# Patient Record
Sex: Male | Born: 1960 | Race: Black or African American | Hispanic: No | Marital: Single | Smoking: Never smoker
Health system: Southern US, Community
[De-identification: ages and names within clinical notes are randomized; demographics above are authoritative.]

## PROBLEM LIST (undated history)

## (undated) DIAGNOSIS — I208 Other forms of angina pectoris: Secondary | ICD-10-CM

## (undated) DIAGNOSIS — I639 Cerebral infarction, unspecified: Secondary | ICD-10-CM

## (undated) DIAGNOSIS — G2581 Restless legs syndrome: Secondary | ICD-10-CM

## (undated) DIAGNOSIS — R269 Unspecified abnormalities of gait and mobility: Secondary | ICD-10-CM

## (undated) DIAGNOSIS — N529 Male erectile dysfunction, unspecified: Secondary | ICD-10-CM

## (undated) DIAGNOSIS — I2510 Atherosclerotic heart disease of native coronary artery without angina pectoris: Secondary | ICD-10-CM

## (undated) DIAGNOSIS — G4700 Insomnia, unspecified: Secondary | ICD-10-CM

## (undated) DIAGNOSIS — D518 Other vitamin B12 deficiency anemias: Secondary | ICD-10-CM

## (undated) DIAGNOSIS — M1990 Unspecified osteoarthritis, unspecified site: Secondary | ICD-10-CM

## (undated) DIAGNOSIS — Z8673 Personal history of transient ischemic attack (TIA), and cerebral infarction without residual deficits: Secondary | ICD-10-CM

## (undated) DIAGNOSIS — L738 Other specified follicular disorders: Secondary | ICD-10-CM

## (undated) DIAGNOSIS — T7840XA Allergy, unspecified, initial encounter: Secondary | ICD-10-CM

## (undated) DIAGNOSIS — I10 Essential (primary) hypertension: Secondary | ICD-10-CM

## (undated) HISTORY — DX: Personal history of transient ischemic attack (TIA), and cerebral infarction without residual deficits: Z86.73

## (undated) HISTORY — DX: Other vitamin B12 deficiency anemias: D51.8

## (undated) HISTORY — DX: Other specified follicular disorders: L73.8

## (undated) HISTORY — DX: Male erectile dysfunction, unspecified: N52.9

## (undated) HISTORY — DX: Insomnia, unspecified: G47.00

## (undated) HISTORY — DX: Restless legs syndrome: G25.81

## (undated) HISTORY — DX: Unspecified abnormalities of gait and mobility: R26.9

## (undated) HISTORY — DX: Allergy, unspecified, initial encounter: T78.40XA

---

## 2011-01-06 ENCOUNTER — Encounter: Payer: Self-pay | Admitting: Family Medicine

## 2011-01-06 ENCOUNTER — Ambulatory Visit (INDEPENDENT_AMBULATORY_CARE_PROVIDER_SITE_OTHER): Payer: 59 | Admitting: Family Medicine

## 2011-01-06 VITALS — Ht 72.0 in | Wt 228.0 lb

## 2011-01-06 DIAGNOSIS — G2581 Restless legs syndrome: Secondary | ICD-10-CM

## 2011-01-06 DIAGNOSIS — J301 Allergic rhinitis due to pollen: Secondary | ICD-10-CM | POA: Insufficient documentation

## 2011-01-06 DIAGNOSIS — Z8249 Family history of ischemic heart disease and other diseases of the circulatory system: Secondary | ICD-10-CM | POA: Insufficient documentation

## 2011-01-06 DIAGNOSIS — N200 Calculus of kidney: Secondary | ICD-10-CM

## 2011-01-06 DIAGNOSIS — N529 Male erectile dysfunction, unspecified: Secondary | ICD-10-CM | POA: Insufficient documentation

## 2011-01-06 DIAGNOSIS — Z23 Encounter for immunization: Secondary | ICD-10-CM

## 2011-01-06 DIAGNOSIS — Z800 Family history of malignant neoplasm of digestive organs: Secondary | ICD-10-CM | POA: Insufficient documentation

## 2011-01-06 DIAGNOSIS — Z0000 Encounter for general adult medical examination without abnormal findings: Secondary | ICD-10-CM

## 2011-01-06 LAB — COMPREHENSIVE METABOLIC PANEL
Albumin: 4.2 g/dL (ref 3.5–5.2)
Alkaline Phosphatase: 53 U/L (ref 39–117)
BUN: 7 mg/dL (ref 6–23)
CO2: 27 mEq/L (ref 19–32)
Calcium: 9.4 mg/dL (ref 8.4–10.5)
Glucose, Bld: 99 mg/dL (ref 70–99)
Potassium: 4.2 mEq/L (ref 3.5–5.3)
Sodium: 141 mEq/L (ref 135–145)
Total Protein: 6.8 g/dL (ref 6.0–8.3)

## 2011-01-06 LAB — LIPID PANEL
Cholesterol: 156 mg/dL (ref 0–200)
HDL: 42 mg/dL (ref >39)
LDL Cholesterol: 106 mg/dL — ABNORMAL HIGH (ref 0–99)
Triglycerides: 40 mg/dL (ref <150)

## 2011-01-06 LAB — POCT URINALYSIS DIPSTICK
Bilirubin, UA: NEGATIVE
Glucose, UA: NEGATIVE
Ketones, UA: NEGATIVE
Leukocytes, UA: NEGATIVE
Nitrite, UA: NEGATIVE
pH, UA: 5

## 2011-01-06 LAB — CBC WITH DIFFERENTIAL/PLATELET
Basophils Relative: 0 % (ref 0–1)
HCT: 45.5 % (ref 39.0–52.0)
Hemoglobin: 15.6 g/dL (ref 13.0–17.0)
Lymphs Abs: 2.2 10*3/uL (ref 0.7–4.0)
MCH: 32.2 pg (ref 26.0–34.0)
MCHC: 34.3 g/dL (ref 30.0–36.0)
Monocytes Absolute: 0.4 10*3/uL (ref 0.1–1.0)
Monocytes Relative: 7 % (ref 3–12)
Neutro Abs: 2.3 10*3/uL (ref 1.7–7.7)
RBC: 4.84 MIL/uL (ref 4.22–5.81)

## 2011-01-06 MED ORDER — ROPINIROLE HCL 1 MG PO TABS: 1.0000 mg | ORAL_TABLET | Freq: Every day | ORAL | Status: DC

## 2011-01-06 MED ORDER — SILDENAFIL CITRATE 100 MG PO TABS: 100.0000 mg | ORAL_TABLET | ORAL | Status: DC | PRN

## 2011-01-06 MED ORDER — CLONAZEPAM 2 MG PO TABS: 2.0000 mg | ORAL_TABLET | Freq: Every day | ORAL | Status: DC

## 2011-01-06 NOTE — Progress Notes (Signed)
  Subjective:    Patient ID: Micheal Gonzalez, male    DOB: 03-19-1961, 50 y.o.   MRN: [id number]  HPI There for complete examination. He does have a previous history of difficulty with throat infections that tend to respond to azithromycin. Other meds have been tried which were unsuccessful. He has had one positive strep screen. He is starting to have a rectal dysfunction, difficulty getting and maintaining an erection. Libido is fine. So has a history of RLS and has had 3 sleep studies. Presently he is on Requip and clonazepam which seems to help. He also has an underlying history of allergies and does occasionally use Benadryl  Review of Systems     Objective:   Physical Exam Ht 6' (1.829 m)  Wt 228 lb (103.42 kg)  BMI 30.92 kg/m2  General Appearance:    Alert, cooperative, no distress, appears stated age  Head:    Normocephalic, without obvious abnormality, atraumatic  Eyes:    PERRL, conjunctiva/corneas clear, EOM's intact, fundi    benign  Ears:    Normal TM's and external ear canals  Nose:   Nares normal, mucosa normal, no drainage or sinus   tenderness  Throat:   Lips, mucosa, and tongue normal; teeth and gums normal  Neck:   Supple, no lymphadenopathy;  thyroid:  no   enlargement/tenderness/nodules; no carotid   bruit or JVD  Back:    Spine nontender, no curvature, ROM normal, no CVA     tenderness  Lungs:     Clear to auscultation bilaterally without wheezes, rales or     ronchi; respirations unlabored  Chest Wall:    No tenderness or deformity   Heart:    Regular rate and rhythm, S1 and S2 normal, no murmur, rub   or gallop  Breast Exam:    No chest wall tenderness, masses or gynecomastia  Abdomen:     Soft, non-tender, nondistended, normoactive bowel sounds,    no masses, no hepatosplenomegaly  Genitalia:    Normal male external genitalia without lesions.  Testicles without masses.  No inguinal hernias.      Extremities:   No clubbing, cyanosis or edema  Pulses:   2+  and symmetric all extremities  Skin:   Skin color, texture, turgor normal, no rashes or lesions  Lymph nodes:   Cervical, supraclavicular, and axillary nodes normal  Neurologic:   CNII-XII intact, normal strength, sensation and gait; reflexes 2+ and symmetric throughout          Psych:   Normal mood, affect, hygiene and grooming.          Assessment & Plan:   1. Routine general medical examination at a health care facility   2. RLS (restless legs syndrome)   3. Allergic rhinitis due to pollen   4. ED (erectile dysfunction)   5. Family history of colon cancer   6. Kidney stones   7. Family history of heart disease in male family member before age 40    PSA was discussed and patient declined Colonoscopy will be set up. I discussed restless leg syndrome with him. I will get the records and see what other medications he has tried. He will continue to treat his allergies as needed. A sample of Viagra was given. He did have stress test 3 years ago therefore we will hold off on this. Discussed followup on his kidney stones.

## 2011-01-10 ENCOUNTER — Telehealth: Payer: Self-pay

## 2011-01-10 NOTE — Telephone Encounter (Signed)
Called pt to let him know labs look good left message 

## 2011-01-24 ENCOUNTER — Other Ambulatory Visit: Payer: Self-pay

## 2011-01-24 MED ORDER — SILDENAFIL CITRATE 100 MG PO TABS: 100.0000 mg | ORAL_TABLET | ORAL | Status: DC | PRN

## 2011-01-24 NOTE — Telephone Encounter (Signed)
Pt said you wanted him to call and let you know the viagra 100 mg work great and if you would please call in RX to [facility] [phone]

## 2011-01-24 NOTE — Telephone Encounter (Signed)
Viagra renewed.

## 2011-02-01 ENCOUNTER — Telehealth: Payer: Self-pay | Admitting: Family Medicine

## 2011-02-01 NOTE — Telephone Encounter (Signed)
CALLED PHARMACY RX WAS SENT AND IS READY FOR PICK UP

## 2011-02-01 NOTE — Telephone Encounter (Signed)
Pt called says the Viagra is working and would like a [facility] [address].  Also pt was concerned that we had lost his medical record and I reviewed in system and all is there.

## 2011-02-01 NOTE — Telephone Encounter (Signed)
CALLED PHARMACY TO SEE IF RX WAS SENT THEY SAID IT WAS READY FOR PT TO PICK UP

## 2011-02-01 NOTE — Telephone Encounter (Signed)
My notes indicate that he should have a refill on file already. Check with the pharmacy and let me know

## 2011-02-01 NOTE — Telephone Encounter (Signed)
CALLED PT TO INFORM HIM LEFT MESSAGE RX READY AT PHARMACY

## 2011-03-25 HISTORY — PX: COLONOSCOPY: SHX174

## 2011-03-28 ENCOUNTER — Encounter: Payer: Self-pay | Admitting: Internal Medicine

## 2011-07-25 ENCOUNTER — Encounter: Payer: Self-pay | Admitting: Medical

## 2011-07-25 ENCOUNTER — Ambulatory Visit (INDEPENDENT_AMBULATORY_CARE_PROVIDER_SITE_OTHER): Payer: 59 | Admitting: Medical

## 2011-07-25 VITALS — BP 130/80 | HR 92 | Temp 98.2°F | Resp 14 | Wt 224.0 lb

## 2011-07-25 DIAGNOSIS — G4700 Insomnia, unspecified: Secondary | ICD-10-CM

## 2011-07-25 DIAGNOSIS — J40 Bronchitis, not specified as acute or chronic: Secondary | ICD-10-CM

## 2011-07-25 MED ORDER — CLONAZEPAM 2 MG PO TABS: 2.0000 mg | ORAL_TABLET | Freq: Every day | ORAL | Status: DC

## 2011-07-25 MED ORDER — HYDROCODONE-HOMATROPINE 5-1.5 MG/5ML PO SYRP: 5.0000 mL | ORAL_SOLUTION | Freq: Three times a day (TID) | ORAL | Status: AC | PRN

## 2011-07-25 NOTE — Patient Instructions (Signed)
Rest, drink plenty of fluids (water, gingerale, Gatorade, for example), OTC Ibuprofen 200 mg, 2-3 tablets every 6 hours as needed for aches/pains/fever.  Consider OTC Robitussin DM or Mucinex DM  If worse or not improving by Wednesday, then call back.

## 2011-07-25 NOTE — Progress Notes (Signed)
Subjective:   HPI  Micheal Gonzalez is a 51 y.o. male who presents with illness.  Started Friday 3 days ago with throat irritation, had fever, sweats, whole body felt warm, no appetite, felt tired and had lots of cough.  The cough has gotten better, but at times when lying down, has bad coughing spell.  No more fever at this point.  In the last 24 hours has post nasal drainage, some hoarseness, phlegm in throat, bad headache on and off.  Doesn't feel particularly congested though.  No sick contacts.  Using prescription codeine cough syrup he had left over that helped with the coughing spells.  Also using some Tylenol for aches, Mucinex.  No other aggravating or relieving factors.  He also needs refill on his sleep medication today.  Uses this nightly, doing well on this.   No other c/o.  The following portions of the patient's history were reviewed and updated as appropriate: allergies, current medications, past family history, past medical history, past social history, past surgical history and problem list.  Past Medical History  Diagnosis Date  . Allergy   . Chronic kidney disease     No Known Allergies   Review of Systems Heent: burning sensation in throat, no ear pain, no sinus pressure Heart: no CP, palpitations Lungs: occasional SOB feeling being worn out, but no wheezing GI: no pain, NVD GU: negative ROS reviewed and was negative other than noted in HPI or above.    Objective:   Physical Exam  General appearance: alert, no distress, WD/WN HEENT: normocephalic, sclerae anicteric, TMs pearly, nares patent, no discharge or erythema, pharynx with mild erythema Oral cavity: MMM, no lesions Neck: supple, no lymphadenopathy, no thyromegaly, no masses Heart: RRR, normal S1, S2, no murmurs Lungs: CTA bilaterally, no wheezes, rhonchi, or rales Abdomen: +bs, soft, non tender, non distended, no masses, no hepatomegaly, no splenomegaly Pulses: 2+ symmetric, upper and lower  extremities, normal cap refill   Assessment and Plan :     Encounter Diagnoses  Name Primary?  . Bronchitis Yes  . Insomnia    Bronchitis - likely secondary to recent flu like illness. Advised rest, goo d hydration, begin Hycodan syrup prn, call/return if worse or not improving.  Insomnia - refilled Clonazepam for sleep

## 2011-07-29 ENCOUNTER — Telehealth: Payer: Self-pay | Admitting: Internal Medicine

## 2011-07-29 NOTE — Telephone Encounter (Signed)
done

## 2011-08-05 ENCOUNTER — Telehealth: Payer: Self-pay | Admitting: Family Medicine

## 2011-08-05 MED ORDER — CLONAZEPAM 2 MG PO TABS: 2.0000 mg | ORAL_TABLET | Freq: Every day | ORAL | Status: DC

## 2011-08-05 NOTE — Telephone Encounter (Signed)
Renew clonazepam

## 2011-08-05 NOTE — Telephone Encounter (Signed)
Called in to [facility]. 

## 2011-08-09 ENCOUNTER — Other Ambulatory Visit: Payer: Self-pay | Admitting: Medical

## 2011-08-09 ENCOUNTER — Telehealth: Payer: Self-pay | Admitting: Medical

## 2011-08-09 MED ORDER — BENZONATATE 200 MG PO CAPS: 200.0000 mg | ORAL_CAPSULE | Freq: Two times a day (BID) | ORAL | Status: DC | PRN

## 2011-08-09 NOTE — Telephone Encounter (Signed)
Patient states that he would like to try the Tesslon Pearls. He states that he is only coughing. No fever,nausea or vomiting. CLS

## 2011-08-09 NOTE — Telephone Encounter (Signed)
Either we can do another round of cough syrup or Tessalon perles.  If still feeling sick, find out about symptoms or he can return

## 2011-08-15 ENCOUNTER — Encounter: Payer: Self-pay | Admitting: Family Medicine

## 2011-08-15 ENCOUNTER — Ambulatory Visit (INDEPENDENT_AMBULATORY_CARE_PROVIDER_SITE_OTHER): Payer: 59 | Admitting: Family Medicine

## 2011-08-15 VITALS — BP 130/90 | HR 73 | Wt 224.0 lb

## 2011-08-15 DIAGNOSIS — J209 Acute bronchitis, unspecified: Secondary | ICD-10-CM

## 2011-08-15 MED ORDER — BENZONATATE 200 MG PO CAPS: 200.0000 mg | ORAL_CAPSULE | Freq: Three times a day (TID) | ORAL | Status: AC | PRN

## 2011-08-15 MED ORDER — CLARITHROMYCIN 500 MG PO TABS: 500.0000 mg | ORAL_TABLET | Freq: Two times a day (BID) | ORAL | Status: AC

## 2011-08-15 NOTE — Progress Notes (Signed)
  Subjective:    Patient ID: Micheal Gonzalez, male    DOB: [birth year], 51 y.o.   MRN: [id number]  HPI He has a 2 week history that started initially with URI symptoms of fever chills, coughing congestion. Now he is just having difficulty with cough but no fever, chills, sore throat or earache no eye allergies and uses Benadryl. He does not smoke   Review of Systems     Objective:   Physical Exam alert and in no distress. Tympanic membranes and canals are normal. Throat is clear. Tonsils are normal. Neck is supple without adenopathy or thyromegaly. Cardiac exam shows a regular sinus rhythm without murmurs or gallops. Lungs are clear to auscultation.        Assessment & Plan:   1. Bronchitis, acute  benzonatate (TESSALON) 200 MG capsule, clarithromycin (BIAXIN) 500 MG tablet

## 2011-08-15 NOTE — Patient Instructions (Signed)
If you're not totally back to normal at the end of the course of antibiotics, get it refilled

## 2011-09-29 ENCOUNTER — Telehealth: Payer: Self-pay | Admitting: Family Medicine

## 2011-09-30 MED ORDER — CLONAZEPAM 2 MG PO TABS: 2.0000 mg | ORAL_TABLET | Freq: Every day | ORAL | Status: DC

## 2011-09-30 NOTE — Telephone Encounter (Signed)
Klonopin renewed

## 2011-10-31 ENCOUNTER — Ambulatory Visit (INDEPENDENT_AMBULATORY_CARE_PROVIDER_SITE_OTHER): Payer: 59 | Admitting: Medical

## 2011-10-31 ENCOUNTER — Encounter: Payer: Self-pay | Admitting: Medical

## 2011-10-31 VITALS — BP 122/88 | HR 95 | Temp 98.2°F | Resp 16 | Wt 226.0 lb

## 2011-10-31 DIAGNOSIS — R05 Cough: Secondary | ICD-10-CM

## 2011-10-31 DIAGNOSIS — J029 Acute pharyngitis, unspecified: Secondary | ICD-10-CM

## 2011-10-31 MED ORDER — CLARITHROMYCIN 500 MG PO TABS: 500.0000 mg | ORAL_TABLET | Freq: Two times a day (BID) | ORAL | Status: AC

## 2011-10-31 MED ORDER — HYDROCODONE-HOMATROPINE 5-1.5 MG/5ML PO SYRP: 5.0000 mL | ORAL_SOLUTION | Freq: Three times a day (TID) | ORAL | Status: AC | PRN

## 2011-10-31 MED ORDER — MONTELUKAST SODIUM 10 MG PO TABS: 10.0000 mg | ORAL_TABLET | Freq: Every day | ORAL | Status: DC

## 2011-10-31 NOTE — Progress Notes (Signed)
Subjective: Here with c/o irritated throat, cough intermittent x 3 days.   He has had similar all his life, has flares ups occasionally.  He was seen here twice in April for the same.  Has had allergy testing in the past, allergic to cigarette smoke, mold and pollen.  Has been on numerous medication in the past.  The regimen prescribed in April (Biaxin) helped but the Tessalon didn't help.  He currently denies sneezing, fever, NVD, chills, sweats.  No sob or wheezing.   Doesn't feel sick.  Tired from coughing all night.  No other c/o.  The following portions of the patient's history were reviewed and updated as appropriate: allergies, current medications, past family history, past medical history, past social history, past surgical history and problem list.  Past Medical History  Diagnosis Date  . Allergy   . ED (erectile dysfunction)   . RLS (restless legs syndrome)   . Folliculitis barbae   . Insomnia     No Known Allergies  Review of Systems ROS reviewed and was negative other than noted in HPI or above.    Objective:   Physical Exam  General appearence: alert, no distress, WD/WN, coughing HEENT: normocephalic, sclerae anicteric, TMs pearly, nares with swollen turbinates, no discharge or erythema, pharynx normal Oral cavity: MMM, no lesions Neck: supple, no lymphadenopathy, no thyromegaly, no masses Heart: RRR, normal S1, S2, no murmurs Lungs: CTA bilaterally, no wheezes, rhonchi, or rales  Assessment and Plan :     Encounter Diagnoses  Name Primary?  . Sore throat Yes  . Cough    Symptoms are most likely inflammatory and due to allergen.  He likely gets antiinflammatory response from antibiotic.  Discussed prevention, allergen control and both preventative and acute therapy.  Script for Biaxin, Singulair, and Hycodan.  Advised salt water gargles, hydrate well, trigger avoidance.  Can use Hycodan short term, Biaxin, but begin Singulair and OTC Zyrtec or Allegra daily.   C/t  preventative regimen for 4-6 mo and lets see how this does. Discussed causes of chronic cough.

## 2011-12-28 ENCOUNTER — Encounter: Payer: Self-pay | Admitting: Family Medicine

## 2011-12-28 ENCOUNTER — Telehealth: Payer: Self-pay | Admitting: Internal Medicine

## 2011-12-28 ENCOUNTER — Ambulatory Visit (INDEPENDENT_AMBULATORY_CARE_PROVIDER_SITE_OTHER): Payer: 59 | Admitting: Family Medicine

## 2011-12-28 VITALS — BP 124/80 | HR 73 | Wt 233.0 lb

## 2011-12-28 DIAGNOSIS — G2581 Restless legs syndrome: Secondary | ICD-10-CM

## 2011-12-28 MED ORDER — CLONAZEPAM 2 MG PO TABS: 2.0000 mg | ORAL_TABLET | Freq: Every day | ORAL | Status: DC

## 2011-12-28 NOTE — Patient Instructions (Signed)
Take 2 Requip for the next several weeks. Hold off on taking the Klonopin then call me

## 2011-12-28 NOTE — Telephone Encounter (Signed)
CALLED MED IN PER [facility] W/ 5 REFILLS

## 2011-12-28 NOTE — Progress Notes (Signed)
  Subjective:    Patient ID: Micheal Gonzalez, male    DOB: 07-May-1960, 51 y.o.   MRN: [id number]  HPI He is here for consult concerning sleep disturbance. He states that he has had 3 sleep studies done the most recent one done several years ago which did show RLS but no evidence of sleep apnea. At that time he was placed on Klonopin stating this was for sleep and Requip for his restless leg. He is here for further consultation concerning his stating that his Mays 4 times per week she will still wake up tired because of the restless legs. Presently he is on 1 mg.   Review of Systems     Objective:   Physical Exam Alert and in no distress otherwise not examined       Assessment & Plan:   1. RLS (restless legs syndrome)    I'll increase his Requip to 2 mg. He is to call me in one or 2 weeks and let me know how this works. Encouraged him to avoid using Klonopin. Release of information form sent again.

## 2011-12-28 NOTE — Telephone Encounter (Signed)
Renew the medication with 5 refills

## 2012-01-25 ENCOUNTER — Other Ambulatory Visit: Payer: Self-pay | Admitting: Family Medicine

## 2012-02-02 ENCOUNTER — Other Ambulatory Visit: Payer: Self-pay | Admitting: Family Medicine

## 2012-02-02 MED ORDER — CLINDAMYCIN PHOS-BENZOYL PEROX 1-5 % EX GEL: Freq: Two times a day (BID) | CUTANEOUS | Status: DC

## 2012-02-02 NOTE — Telephone Encounter (Signed)
Is this ok?

## 2012-02-02 NOTE — Telephone Encounter (Signed)
He is supposed to be taking 2 mg. Check and see if this is working.

## 2012-02-02 NOTE — Telephone Encounter (Signed)
Pt states he has always taken 1 mg

## 2012-02-23 ENCOUNTER — Other Ambulatory Visit: Payer: Self-pay | Admitting: Family Medicine

## 2012-02-24 NOTE — Telephone Encounter (Signed)
Is this ok?

## 2012-04-02 ENCOUNTER — Telehealth: Payer: Self-pay | Admitting: Family Medicine

## 2012-04-02 NOTE — Telephone Encounter (Signed)
Have him set up an appointment so we can go over this

## 2012-04-02 NOTE — Telephone Encounter (Signed)
Pt dropped off a letter from [employer] stating that they will no longer cover duac. I am sending that letter back attached to his paper chart. This is active first of the year. Pt needs refill on that now before end of year.

## 2012-04-02 NOTE — Telephone Encounter (Signed)
Pt is coming in tomorrow to discuss this 

## 2012-04-02 NOTE — Telephone Encounter (Signed)
Called pt to return my call.

## 2012-04-03 ENCOUNTER — Ambulatory Visit (INDEPENDENT_AMBULATORY_CARE_PROVIDER_SITE_OTHER): Payer: 59 | Admitting: Family Medicine

## 2012-04-03 ENCOUNTER — Encounter: Payer: Self-pay | Admitting: Family Medicine

## 2012-04-03 VITALS — BP 140/86 | HR 72 | Wt 223.0 lb

## 2012-04-03 DIAGNOSIS — L738 Other specified follicular disorders: Secondary | ICD-10-CM

## 2012-04-03 DIAGNOSIS — N529 Male erectile dysfunction, unspecified: Secondary | ICD-10-CM

## 2012-04-03 DIAGNOSIS — B350 Tinea barbae and tinea capitis: Secondary | ICD-10-CM

## 2012-04-03 DIAGNOSIS — G2581 Restless legs syndrome: Secondary | ICD-10-CM

## 2012-04-03 MED ORDER — CLINDAMYCIN PHOS-BENZOYL PEROX 1-5 % EX GEL: Freq: Two times a day (BID) | CUTANEOUS | Status: DC

## 2012-04-03 MED ORDER — PRAMIPEXOLE DIHYDROCHLORIDE 0.25 MG PO TABS: 0.2500 mg | ORAL_TABLET | Freq: Three times a day (TID) | ORAL | Status: DC

## 2012-04-03 NOTE — Progress Notes (Signed)
  Subjective:    Patient ID: Micheal Gonzalez, male    DOB: 1960-10-27, 51 y.o.   MRN: [id number]  HPI He is here for consult concerning multiple issues. His insurance will change would like to switch to a different product for his skin. 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 He also has underlying RLS. He did increase his Requip to 2 mg but it caused GI distress and he is back off to one. He also has erectile dysfunction and has questions concerning using a different medication. He is not presently on Viagra and gets good results with this. Apparently his insurance is going to cut back on payment.  Review of Systems     Objective:   Physical Exam Alert and in no distress otherwise not examined       Assessment & Plan:   1. Folliculitis barbae  clindamycin-benzoyl peroxide (BENZACLIN) gel  2. RLS (restless legs syndrome)  pramipexole (MIRAPEX) 0.25 MG tablet  3. ED (erectile dysfunction)     we will again attempt to get his records from his physician in [place]. The request form has been sent off 4 times but we have yet to receive his sleep study. I will switch him to Mirapex starting at 0.25. He is to use this for several days and if no good results, he'll increase this to 0.5. We also discussed erectile dysfunction. He seems to be doing quite well on Viagra and I recommended that he continue on this and use the minimum effective dose to help with reducing cost. He is to use of new topical medication however if he notes difficulty, he is to let me know so I can hopefully get him the branded product.

## 2012-04-08 ENCOUNTER — Other Ambulatory Visit: Payer: Self-pay | Admitting: Family Medicine

## 2012-04-09 NOTE — Telephone Encounter (Signed)
Is this ok?

## 2012-05-08 ENCOUNTER — Encounter: Payer: Self-pay | Admitting: Family Medicine

## 2012-05-08 ENCOUNTER — Ambulatory Visit (INDEPENDENT_AMBULATORY_CARE_PROVIDER_SITE_OTHER): Payer: 59 | Admitting: Family Medicine

## 2012-05-08 VITALS — BP 120/80 | HR 66 | Temp 97.8°F | Wt 228.0 lb

## 2012-05-08 DIAGNOSIS — J029 Acute pharyngitis, unspecified: Secondary | ICD-10-CM

## 2012-05-08 LAB — POCT RAPID STREP A (OFFICE): Rapid Strep A Screen: NEGATIVE

## 2012-05-08 NOTE — Progress Notes (Signed)
  Subjective:    Patient ID: Micheal Gonzalez, male    DOB: 04/12/1961, 52 y.o.   MRN: [id number]  HPI He complains of a three-day history of sore throat, nasal congestion, slight lightheadedness with headache and fatigue. No fever, myalgias, nausea or diarrhea.   Review of Systems     Objective:   Physical Exam alert and in no distress. Tympanic membranes and canals are normal. Throat is clear. Tonsils are normal. Neck is supple without adenopathy or thyromegaly. Cardiac exam shows a regular sinus rhythm without murmurs or gallops. Lungs are clear to auscultation. Strep test is negative       Assessment & Plan:   1. Sore throat  Rapid Strep A   supportive care. He is to call if further difficulties.

## 2012-05-08 NOTE — Patient Instructions (Signed)
Viral Pharyngitis  Viral pharyngitis is a viral infection that produces redness, pain, and swelling (inflammation) of the throat. It can spread from person to person (contagious).  CAUSES  Viral pharyngitis is caused by inhaling a large amount of certain germs called viruses. Many different viruses cause viral pharyngitis.  SYMPTOMS  Symptoms of viral pharyngitis include:   Sore throat.   Tiredness.   Stuffy nose.   Low-grade fever.   Congestion.   Cough.  TREATMENT  Treatment includes rest, drinking plenty of fluids, and the use of over-the-counter medication (approved by your caregiver).  HOME CARE INSTRUCTIONS    Drink enough fluids to keep your urine clear or pale yellow.   Eat soft, cold foods such as ice cream, frozen ice pops, or gelatin dessert.   Gargle with warm salt water (1 tsp salt per 1 qt of water).   If over age 7, throat lozenges may be used safely.   Only take over-the-counter or prescription medicines for pain, discomfort, or fever as directed by your caregiver. Do not take aspirin.  To help prevent spreading viral pharyngitis to others, avoid:   Mouth-to-mouth contact with others.   Sharing utensils for eating and drinking.   Coughing around others.  SEEK MEDICAL CARE IF:    You are better in a few days, then become worse.   You have a fever or pain not helped by pain medicines.   There are any other changes that concern you.  Document Released: 01/19/2005 Document Revised: 07/04/2011 Document Reviewed: 06/17/2010  [employer] Patient Information 2013 [employer].

## 2012-05-17 ENCOUNTER — Ambulatory Visit (INDEPENDENT_AMBULATORY_CARE_PROVIDER_SITE_OTHER): Payer: 59 | Admitting: Family Medicine

## 2012-05-17 ENCOUNTER — Encounter: Payer: Self-pay | Admitting: Family Medicine

## 2012-05-17 ENCOUNTER — Telehealth: Payer: Self-pay | Admitting: Internal Medicine

## 2012-05-17 VITALS — BP 130/90 | HR 60 | Ht 72.0 in | Wt 227.0 lb

## 2012-05-17 DIAGNOSIS — Z8249 Family history of ischemic heart disease and other diseases of the circulatory system: Secondary | ICD-10-CM

## 2012-05-17 DIAGNOSIS — G2581 Restless legs syndrome: Secondary | ICD-10-CM

## 2012-05-17 DIAGNOSIS — N529 Male erectile dysfunction, unspecified: Secondary | ICD-10-CM

## 2012-05-17 DIAGNOSIS — Z87442 Personal history of urinary calculi: Secondary | ICD-10-CM

## 2012-05-17 DIAGNOSIS — J309 Allergic rhinitis, unspecified: Secondary | ICD-10-CM

## 2012-05-17 DIAGNOSIS — Z823 Family history of stroke: Secondary | ICD-10-CM | POA: Insufficient documentation

## 2012-05-17 DIAGNOSIS — Z0000 Encounter for general adult medical examination without abnormal findings: Secondary | ICD-10-CM

## 2012-05-17 DIAGNOSIS — Z23 Encounter for immunization: Secondary | ICD-10-CM

## 2012-05-17 DIAGNOSIS — Z800 Family history of malignant neoplasm of digestive organs: Secondary | ICD-10-CM

## 2012-05-17 LAB — COMPREHENSIVE METABOLIC PANEL
ALT: 13 U/L (ref 0–53)
BUN: 8 mg/dL (ref 6–23)
CO2: 26 mEq/L (ref 19–32)
Calcium: 9.1 mg/dL (ref 8.4–10.5)
Chloride: 104 mEq/L (ref 96–112)
Creat: 1.22 mg/dL (ref 0.50–1.35)
Total Bilirubin: 0.6 mg/dL (ref 0.3–1.2)

## 2012-05-17 LAB — LIPID PANEL
Cholesterol: 161 mg/dL (ref 0–200)
HDL: 50 mg/dL (ref >39)
Total CHOL/HDL Ratio: 3.2 Ratio

## 2012-05-17 LAB — CBC WITH DIFFERENTIAL/PLATELET
Eosinophils Absolute: 0.1 10*3/uL (ref 0.0–0.7)
Eosinophils Relative: 2 % (ref 0–5)
HCT: 43.6 % (ref 39.0–52.0)
Lymphocytes Relative: 40 % (ref 12–46)
Lymphs Abs: 1.8 10*3/uL (ref 0.7–4.0)
MCH: 32.8 pg (ref 26.0–34.0)
MCV: 91 fL (ref 78.0–100.0)
Monocytes Absolute: 0.4 10*3/uL (ref 0.1–1.0)
Platelets: 191 10*3/uL (ref 150–400)
RBC: 4.79 MIL/uL (ref 4.22–5.81)
RDW: 13.9 % (ref 11.5–15.5)
WBC: 4.5 10*3/uL (ref 4.0–10.5)

## 2012-05-17 LAB — POCT URINALYSIS DIPSTICK
Bilirubin, UA: NEGATIVE
Ketones, UA: NEGATIVE
Leukocytes, UA: NEGATIVE
Nitrite, UA: NEGATIVE

## 2012-05-17 NOTE — Progress Notes (Signed)
Subjective:    Patient ID: Micheal Gonzalez, male    DOB: 10-13-1960, 52 y.o.   MRN: [id number]  HPI He is here for complete examination. He has a sister that recently had a CVA who was in her early 45s. His mother apparently had a heart attack at age 41. He also has another sister that had colon cancer. He has a remote history of renal stone. He does have history of seasonal allergies give him difficulty mainly in the spring. He does have a history of RLS as well as a questionable history of sleep apnea. That record was reviewed. He seems to be quite stable on his present medication regimen of Requip and Klonopin. At this time I see no reason to change this. He does have underlying ED .   Review of Systems Negative except as above    Objective:   Physical Exam BP 130/90  Pulse 60  Ht 6' (1.829 m)  Wt 227 lb (102.967 kg)  BMI 30.79 kg/m2  General Appearance:    Alert, cooperative, no distress, appears stated age  Head:    Normocephalic, without obvious abnormality, atraumatic  Eyes:    PERRL, conjunctiva/corneas clear, EOM's intact, fundi    benign  Ears:    Normal TM's and external ear canals  Nose:   Nares normal, mucosa normal, no drainage or sinus   tenderness  Throat:   Lips, mucosa, and tongue normal; teeth and gums normal  Neck:   Supple, no lymphadenopathy;  thyroid:  no   enlargement/tenderness/nodules; no carotid   bruit or JVD  Back:    Spine nontender, no curvature, ROM normal, no CVA     tenderness  Lungs:     Clear to auscultation bilaterally without wheezes, rales or     ronchi; respirations unlabored  Chest Wall:    No tenderness or deformity   Heart:    Regular rate and rhythm, S1 and S2 normal, no murmur, rub   or gallop  Breast Exam:    No chest wall tenderness, masses or gynecomastia  Abdomen:     Soft, non-tender, nondistended, normoactive bowel sounds,    no masses, no hepatosplenomegaly  Genitalia:   deferred   Rectal:   deferred   Extremities:   No  clubbing, cyanosis or edema  Pulses:   2+ and symmetric all extremities  Skin:   Skin color, texture, turgor normal, no rashes or lesions  Lymph nodes:   Cervical, supraclavicular, and axillary nodes normal  Neurologic:   CNII-XII intact, normal strength, sensation and gait; reflexes 2+ and symmetric throughout          Psych:   Normal mood, affect, hygiene and grooming.           Assessment & Plan:   1. Routine general medical examination at a health care facility  POCT Urinalysis Dipstick, Visual acuity screening, EKG 12-Lead, Lipid panel, CBC with Differential, Comprehensive metabolic panel  2. Need for prophylactic vaccination and inoculation against influenza  Flu vaccine greater than or equal to 3yo preservative free IM  3. Family history of CVA    4. Family history of colon cancer    5. Family history of heart disease in male family member before age 44  EKG 12-Lead, Ambulatory referral to Cardiology  6. RLS (restless legs syndrome)    7. History of renal stone    8. ED (erectile dysfunction)    9. Allergic rhinitis, mild     did recommend taking one  baby aspirin every day. Refer to cardiology because of his family history. He has had a colonoscopy. Flu shot given with risks and benefits discussed. Discussed possible statin therapy after I review the lab data. Continue on his present medication regimen for the RLS. Discussed treatment of his kidney stone especially if this occurs during regular office hours.

## 2012-05-17 NOTE — Telephone Encounter (Signed)
Pt has an appt with [facility] cardiology Thursday February 6,2014 @ 8:45am with Dr. Melburn Popper. [address]

## 2012-05-18 NOTE — Progress Notes (Signed)
Quick Note:  The blood work is normal ______ 

## 2012-05-18 NOTE — Progress Notes (Signed)
Quick Note:  CALLED PT HOME# PT WAS ADVISED BLOOD WORK NORMAL PT VERBALIZED UNDERSTANDING ______

## 2012-05-21 ENCOUNTER — Encounter: Payer: Self-pay | Admitting: Internal Medicine

## 2012-05-31 ENCOUNTER — Ambulatory Visit (INDEPENDENT_AMBULATORY_CARE_PROVIDER_SITE_OTHER): Payer: 59 | Admitting: Cardiovascular Disease

## 2012-05-31 ENCOUNTER — Encounter: Payer: Self-pay | Admitting: Cardiovascular Disease

## 2012-05-31 VITALS — BP 152/106 | HR 69 | Ht 72.0 in | Wt 229.8 lb

## 2012-05-31 DIAGNOSIS — I10 Essential (primary) hypertension: Secondary | ICD-10-CM

## 2012-05-31 NOTE — Progress Notes (Signed)
     [name] Date of Birth  1961-02-08       [facility]    [place] [address]  [address], [address] [place], [place]  [address]   [place], [place]  [address] [phone]     [phone]   Fax  [phone]    Fax [phone]  Problem List: 1. Hypertension 2.   History of Present Illness:  Micheal Gonzalez is a 52 yo with a strong family history of cardiac problems ( mother had CABG)  He was referred today by Dr. Susann Givens for further evaluation.  He denies any episodes of chest pain or shortness of breath. He denies any syncope. 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 He does not get much regular exercise.  His BP is elevated today - it typically is well controlled.    Current Outpatient Prescriptions on File Prior to Visit  Medication Sig Dispense Refill  . clonazePAM (KLONOPIN) 2 MG tablet Take 1 tablet (2 mg total) by mouth daily.  30 tablet  5  . rOPINIRole (REQUIP) 1 MG tablet Take 1 mg by mouth at bedtime.       Marland Kitchen VIAGRA 100 MG tablet TAKE 1 TABLET (100 MG TOTAL) BY MOUTH AS NEEDED FOR ERECTILE DYSFUNCTION.  6 tablet  11    No Known Allergies  Past Medical History  Diagnosis Date  . Allergy   . ED (erectile dysfunction)   . RLS (restless legs syndrome)   . Folliculitis barbae   . Insomnia     Past Surgical History  Procedure Date  . Colonoscopy 03/25/11    History  Smoking status  . Never Smoker   Smokeless tobacco  . Never Used    History  Alcohol Use  . 1.0 oz/week  . 2 drink(s) per week    Comment: 2 drink per week.    Family History  Problem Relation Age of Onset  . Cancer Mother 43    Breast cancer  . Heart disease Mother 57    CABG  . Cancer Sister 55    Colon Ca    Reviw of Systems:  Reviewed in the HPI.  All other systems are negative.  Physical Exam: Blood pressure 152/106, pulse 69, height 6' (1.829 m), weight 229 lb 12.8 oz (104.237 kg),  SpO2 98.00%. General: Well developed, well nourished, in no acute distress.  Head: Normocephalic, atraumatic, sclera non-icteric, mucus membranes are moist,   Neck: Supple. Carotids are 2 + without bruits. No JVD   Lungs: Clear   Heart: RR, normal S1, S2  Abdomen: Soft, non-tender, non-distended with normal bowel sounds.  Msk:  Strength and tone are normal   Extremities: No clubbing or cyanosis. No edema.  Distal pedal pulses are 2+ and equal    Neuro: CN II - XII intact.  Alert and oriented X 3.   Psych:  Normal   ECG: May 17, 2012:  NSR at 65. Voltage for LVH.  Assessment / Plan:

## 2012-05-31 NOTE — Assessment & Plan Note (Signed)
Micheal Gonzalez presents today for further evaluation of his cardiovascular risks. His blood pressure is elevated today and has been elevated for the past several months. He denies any chest pain or shortness breath. There is no indication for stress testing at this point.  We had long discussion about low-salt diet and a regular exercise program. We will give him information on the DASH diet. I've asked him to exercise for an hour at a 3-4 times a week. I'll see him back in 6 months for followup office visit. He was given him the goal of losing 10 pounds by that time.  His cholesterol levels are quite good.

## 2012-05-31 NOTE — Patient Instructions (Addendum)
Your physician recommends that you continue on your current medications as directed. Please refer to the Current Medication list given to you today.  Your physician encouraged you to lose weight for better health.  Your physician wants you to follow-up in: 6 months You will receive a reminder letter in the mail two months in advance. If you don't receive a letter, please call our office to schedule the follow-up appointment.  REDUCE HIGH SODIUM FOODS LIKE CANNED SOUP, GRAVY, SAUCES, READY PREPARED FOODS LIKE FROZEN FOODS; LEAN CUISINE, LASAGNA. BACON, SAUSAGE, LUNCH MEAT, FAST FOODS.Marland Kitchen   DASH Diet The DASH diet stands for "Dietary Approaches to Stop Hypertension." It is a healthy eating plan that has been shown to reduce high blood pressure (hypertension) in as little as 14 days, while also possibly providing other significant health benefits. These other health benefits include reducing the risk of breast cancer after menopause and reducing the risk of type 2 diabetes, heart disease, colon cancer, and stroke. Health benefits also include weight loss and slowing kidney failure in patients with chronic kidney disease.  DIET GUIDELINES  Limit salt (sodium). Your diet should contain less than 1500 mg of sodium daily.  Limit refined or processed carbohydrates. Your diet should include mostly whole grains. Desserts and added sugars should be used sparingly.  Include small amounts of heart-healthy fats. These types of fats include nuts, oils, and tub margarine. Limit saturated and trans fats. These fats have been shown to be harmful in the body. CHOOSING FOODS  The following food groups are based on a 2000 calorie diet. See your Registered Dietitian for individual calorie needs. Grains and Grain Products (6 to 8 servings daily)  Eat More Often: Whole-wheat bread, brown rice, whole-grain or wheat pasta, quinoa, popcorn without added fat or salt (air popped).  Eat Less Often: White bread, white pasta,  white rice, cornbread. Vegetables (4 to 5 servings daily)  Eat More Often: Fresh, frozen, and canned vegetables. Vegetables may be raw, steamed, roasted, or grilled with a minimal amount of fat.  Eat Less Often/Avoid: Creamed or fried vegetables. Vegetables in a cheese sauce. Fruit (4 to 5 servings daily)  Eat More Often: All fresh, canned (in natural juice), or frozen fruits. Dried fruits without added sugar. One hundred percent fruit juice ( cup [237 mL] daily).  Eat Less Often: Dried fruits with added sugar. Canned fruit in light or heavy syrup. [employer], Fish, and Poultry (2 servings or less daily. One serving is 3 to 4 oz [85-114 g]).  Eat More Often: Ninety percent or leaner ground beef, tenderloin, sirloin. Round cuts of beef, chicken breast, [place] breast. All fish. Grill, bake, or broil your meat. Nothing should be fried.  Eat Less Often/Avoid: Fatty cuts of meat, [place], or chicken leg, thigh, or wing. Fried cuts of meat or fish. Dairy (2 to 3 servings)  Eat More Often: Low-fat or fat-free milk, low-fat plain or light yogurt, reduced-fat or part-skim cheese.  Eat Less Often/Avoid: Milk (whole, 2%).Whole milk yogurt. Full-fat cheeses. Nuts, Seeds, and Legumes (4 to 5 servings per week)  Eat More Often: All without added salt.  Eat Less Often/Avoid: Salted nuts and seeds, canned beans with added salt. Fats and Sweets (limited)  Eat More Often: Vegetable oils, tub margarines without trans fats, sugar-free gelatin. Mayonnaise and salad dressings.  Eat Less Often/Avoid: Coconut oils, palm oils, butter, stick margarine, cream, half and half, cookies, candy, pie. FOR MORE INFORMATION The Dash Diet Eating Plan: www.dashdiet.org Document Released: 03/31/2011 Document  Revised: 07/04/2011 Document Reviewed: 03/31/2011 [facility] Patient Information 2013 [place], [place].

## 2012-07-05 ENCOUNTER — Other Ambulatory Visit: Payer: Self-pay | Admitting: Family Medicine

## 2012-07-05 NOTE — Telephone Encounter (Signed)
Is this ok?

## 2012-07-05 NOTE — Telephone Encounter (Signed)
Called med in per jcl 

## 2012-07-05 NOTE — Telephone Encounter (Signed)
Renew his Klonopin for 6 months

## 2012-10-09 ENCOUNTER — Inpatient Hospital Stay (HOSPITAL_COMMUNITY): Payer: 59

## 2012-10-09 ENCOUNTER — Emergency Department (HOSPITAL_COMMUNITY): Payer: 59

## 2012-10-09 ENCOUNTER — Inpatient Hospital Stay (HOSPITAL_COMMUNITY)
Admission: EM | Admit: 2012-10-09 | Discharge: 2012-10-12 | DRG: 065 | Disposition: A | Discharge status: Rehabilitation Facility | Payer: 59 | Attending: Internal Medicine | Admitting: Internal Medicine

## 2012-10-09 ENCOUNTER — Encounter (HOSPITAL_COMMUNITY): Payer: Self-pay | Admitting: Emergency Medicine

## 2012-10-09 DIAGNOSIS — I639 Cerebral infarction, unspecified: Secondary | ICD-10-CM

## 2012-10-09 DIAGNOSIS — I69998 Other sequelae following unspecified cerebrovascular disease: Secondary | ICD-10-CM

## 2012-10-09 DIAGNOSIS — G8190 Hemiplegia, unspecified affecting unspecified side: Secondary | ICD-10-CM | POA: Diagnosis present

## 2012-10-09 DIAGNOSIS — I69993 Ataxia following unspecified cerebrovascular disease: Secondary | ICD-10-CM

## 2012-10-09 DIAGNOSIS — I6350 Cerebral infarction due to unspecified occlusion or stenosis of unspecified cerebral artery: Principal | ICD-10-CM | POA: Diagnosis present

## 2012-10-09 DIAGNOSIS — R29898 Other symptoms and signs involving the musculoskeletal system: Secondary | ICD-10-CM

## 2012-10-09 DIAGNOSIS — I498 Other specified cardiac arrhythmias: Secondary | ICD-10-CM | POA: Diagnosis present

## 2012-10-09 DIAGNOSIS — R209 Unspecified disturbances of skin sensation: Secondary | ICD-10-CM

## 2012-10-09 DIAGNOSIS — R4789 Other speech disturbances: Secondary | ICD-10-CM | POA: Diagnosis present

## 2012-10-09 DIAGNOSIS — R269 Unspecified abnormalities of gait and mobility: Secondary | ICD-10-CM

## 2012-10-09 DIAGNOSIS — N529 Male erectile dysfunction, unspecified: Secondary | ICD-10-CM | POA: Diagnosis present

## 2012-10-09 DIAGNOSIS — I10 Essential (primary) hypertension: Secondary | ICD-10-CM | POA: Diagnosis present

## 2012-10-09 DIAGNOSIS — I6789 Other cerebrovascular disease: Secondary | ICD-10-CM

## 2012-10-09 DIAGNOSIS — R2681 Unsteadiness on feet: Secondary | ICD-10-CM

## 2012-10-09 DIAGNOSIS — G2581 Restless legs syndrome: Secondary | ICD-10-CM

## 2012-10-09 LAB — COMPREHENSIVE METABOLIC PANEL
AST: 17 U/L (ref 0–37)
Albumin: 3.9 g/dL (ref 3.5–5.2)
BUN: 9 mg/dL (ref 6–23)
CO2: 26 mEq/L (ref 19–32)
Calcium: 9.4 mg/dL (ref 8.4–10.5)
Chloride: 104 mEq/L (ref 96–112)
Creatinine, Ser: 1.2 mg/dL (ref 0.50–1.35)
GFR calc non Af Amer: 67 mL/min — ABNORMAL LOW (ref >90)
Total Bilirubin: 0.4 mg/dL (ref 0.3–1.2)

## 2012-10-09 LAB — TROPONIN I: Troponin I: <0.3 ng/mL (ref <0.30)

## 2012-10-09 LAB — CBC WITH DIFFERENTIAL/PLATELET
Basophils Absolute: 0 10*3/uL (ref 0.0–0.1)
Basophils Relative: 0 % (ref 0–1)
Eosinophils Relative: 2 % (ref 0–5)
HCT: 42.7 % (ref 39.0–52.0)
Hemoglobin: 15.7 g/dL (ref 13.0–17.0)
MCH: 33.7 pg (ref 26.0–34.0)
MCHC: 36.8 g/dL — ABNORMAL HIGH (ref 30.0–36.0)
MCV: 91.6 fL (ref 78.0–100.0)
Monocytes Absolute: 0.5 10*3/uL (ref 0.1–1.0)
Monocytes Relative: 8 % (ref 3–12)
RDW: 12.7 % (ref 11.5–15.5)

## 2012-10-09 MED ORDER — ASPIRIN 325 MG PO TABS
325.0000 mg | ORAL_TABLET | Freq: Every day | ORAL | Status: DC
  Administered 2012-10-09 – 2012-10-12 (×4): 325 mg via ORAL
  Filled 2012-10-09 (×5): qty 1

## 2012-10-09 MED ORDER — HYDRALAZINE HCL 20 MG/ML IJ SOLN
5.0000 mg | Freq: Three times a day (TID) | INTRAMUSCULAR | Status: DC | PRN
  Filled 2012-10-09 (×2): qty 1

## 2012-10-09 MED ORDER — ONDANSETRON HCL 4 MG/2ML IJ SOLN: 4.0000 mg | Freq: Three times a day (TID) | INTRAMUSCULAR | Status: AC | PRN

## 2012-10-09 MED ORDER — SODIUM CHLORIDE 0.9 % IV BOLUS (SEPSIS)
1000.0000 mL | Freq: Once | INTRAVENOUS | Status: AC
  Administered 2012-10-09: 1000 mL via INTRAVENOUS

## 2012-10-09 MED ORDER — ENOXAPARIN SODIUM 40 MG/0.4ML ~~LOC~~ SOLN
40.0000 mg | SUBCUTANEOUS | Status: DC
  Administered 2012-10-09 – 2012-10-12 (×4): 40 mg via SUBCUTANEOUS
  Filled 2012-10-09 (×4): qty 0.4

## 2012-10-09 MED ORDER — CLONAZEPAM 1 MG PO TABS
2.0000 mg | ORAL_TABLET | Freq: Every day | ORAL | Status: DC
  Administered 2012-10-09 – 2012-10-11 (×3): 2 mg via ORAL
  Filled 2012-10-09 (×3): qty 2

## 2012-10-09 MED ORDER — HYDRALAZINE HCL 20 MG/ML IJ SOLN
5.0000 mg | Freq: Three times a day (TID) | INTRAMUSCULAR | Status: DC | PRN
  Administered 2012-10-11: 5 mg via INTRAVENOUS

## 2012-10-09 MED ORDER — ACETAMINOPHEN 325 MG PO TABS
650.0000 mg | ORAL_TABLET | Freq: Four times a day (QID) | ORAL | Status: DC | PRN
  Administered 2012-10-09 – 2012-10-11 (×3): 650 mg via ORAL
  Filled 2012-10-09 (×3): qty 2

## 2012-10-09 MED ORDER — SENNOSIDES-DOCUSATE SODIUM 8.6-50 MG PO TABS: 1.0000 | ORAL_TABLET | Freq: Every evening | ORAL | Status: DC | PRN

## 2012-10-09 MED ORDER — SODIUM CHLORIDE 0.9 % IV SOLN
INTRAVENOUS | Status: DC
  Administered 2012-10-09: 22:00:00 via INTRAVENOUS

## 2012-10-09 MED ORDER — HYDRALAZINE HCL 20 MG/ML IJ SOLN: 2.0000 mg | Freq: Three times a day (TID) | INTRAMUSCULAR | Status: DC | PRN

## 2012-10-09 NOTE — Progress Notes (Signed)
Notified by the nurse that's patient BP was 178/107, ordered hydralazine 5 mg  to be given when BP> 190/110 MMHG. But pt received a dose of hydralazine for the above BP. patient's BP after hydralazine was given was 129/48mmhg. Discussed with the RN and the patient regarding the parameters of the anti- hypertensive. RN and patient aware of the error. Patient further was ordered NS bolus and was put on head end of bed flat to improve BP. Recommended to check BP in one hour and discussed with the night RN.   Kathlen Mody, MD.

## 2012-10-09 NOTE — Progress Notes (Signed)
Echo Lab  2D Echocardiogram completed.  Janaiah Vetrano L Jakira Mcfadden, RDCS 10/09/2012 12:17 PM

## 2012-10-09 NOTE — ED Notes (Signed)
Pt transported to CT ?

## 2012-10-09 NOTE — Consult Note (Signed)
Neurology Consultation Reason for Consult: Dizziness Referring Physician: Nichola Sizer  CC: Lightheadedness  History is obtained from:Patient  HPI: Micheal Gonzalez is a 52 y.o. male with a history of hypertension, not on treatment, who presents with lightheadedness and difficulty walking. He states that this started yesterday around 4 pm. It has been persistent since that time. He has not noticed any difficulty with hand movements. He has not had any true vertigo, headache, nausea/vomiting. He states that his BP does run high, but has been "watching it" with his PCP. He does not check it regularly due to a broken cuff.    LKW: 06/17 4pm tpa given: no, out of window    ROS: A 14 point ROS was performed and is negative except as noted in the HPI.  Past Medical History  Diagnosis Date  . Allergy   . ED (erectile dysfunction)   . RLS (restless legs syndrome)   . Folliculitis barbae   . Insomnia   . Insomnia     Family History: Sister - CVA in her early 37s  Social History: Tob: none  Exam: Current vital signs: BP 157/108  Pulse 75  Temp(Src) 98.4 F (36.9 C) (Oral)  Resp 19  SpO2 98% Vital signs in last 24 hours: Temp:  [98 F (36.7 C)-98.4 F (36.9 C)] 98.4 F (36.9 C) (06/17 0246) Pulse Rate:  [60-76] 75 (06/17 0430) Resp:  [14-22] 19 (06/17 0430) BP: (146-183)/(99-154) 157/108 mmHg (06/17 0430) SpO2:  [96 %-99 %] 98 % (06/17 0430)  General: in bed, NAD CV: RRR Mental Status: Patient is awake, alert, oriented to person, place, month, year, and situation. Immediate and remote memory are intact. Patient is able to give a clear and coherent history. No signs of aphasia or neglect Cranial Nerves: II: Visual Fields are full. Pupils are equal, round, and reactive to light.  Discs are difficult to visualize. III,IV, VI: EOMI without ptosis or diploplia.  V: Facial sensation is symmetric to temperature VII: Facial movement is symmetric.  VIII: hearing is intact to  voice X: Uvula elevates symmetrically XI: Shoulder shrug is symmetric. XII: tongue is midline without atrophy or fasciculations.  Motor: Tone is normal. Bulk is normal. 5/5 strength was present in all four extremities.  Sensory: Sensation is symmetric to light touch and temperature in the arms and legs. Deep Tendon Reflexes: 2+ and symmetric in the biceps and patellae.  Plantars: Toes are downgoing bilaterally.  Cerebellar: FNF  intact bilaterally, mild difficulty with HKS on the right Gait: When standing, patient favors his left leg.   I have reviewed labs in epic and the results pertinent to this consultation are: BMP, CBC - unremarkable  Impression: 52 yo M with new onset unsteadiness and lightheadedness in the setting of hypertension. Possibilities include hypertensive emergency vs small pontine or cerebellar infarct.   Recommendations: 1) MRI brain - if negative, would treat as hypertensive emergency, if positive would perform stroke workup as follows:     1. HgbA1c, fasting lipid panel     2. MRI, MRA  of the brain without contrast     3. Frequent neuro checks     4. Echocardiogram     5. Carotid dopplers     6. Prophylactic therapy-Antiplatelet med: Aspirin - dose 325mg      7. Risk factor modification     8. Telemetry monitoring     9. PT consult, OT consult, Speech consult    Ritta Slot, MD [facility] [phone]  If 7pm-  7am, please page neurology on call at [phone].

## 2012-10-09 NOTE — ED Provider Notes (Signed)
History     CSN: [id number]  Arrival date & time 10/09/12  0039   First MD Initiated Contact with Patient 10/09/12 [phone]      Chief Complaint  Patient presents with  . Dizziness    HPI Micheal Gonzalez is a 52 y.o. male with a history of diet-controlled hypertension, who presents with right leg weakness and dizziness. According to the patient at about 6:45 in the afternoon, he was sitting at his desk when he got acute onset of dizziness, he stood up and noted right leg weakness. The right leg weakness was mild to moderate, it waxed and waned over the course of the afternoon, patient eventually went home and went to bed.  Patient woke up at about midnight and decided to come to the emergency department, he says he still feels like he has right leg weakness it is common gone, it would point he stumbled and almost fell in the waiting room. Patient is currently not dizzy, no headache, has had no chest pain, no shortness of breath, no abdominal pain, no nausea vomiting, diarrhea, no fevers or chills. Patient has been healthy up until this point. No history of hyperlipidemia or diabetes. Patient's mother had history of CABG in her 73s, patient has a sister is 44 with history of CVA. Patient's concerned about stroke    Past Medical History  Diagnosis Date  . Allergy   . ED (erectile dysfunction)   . RLS (restless legs syndrome)   . Folliculitis barbae   . Insomnia   . Insomnia     Past Surgical History  Procedure Laterality Date  . Colonoscopy  03/25/11    Family History  Problem Relation Age of Onset  . Cancer Mother 68    Breast cancer  . Heart disease Mother 38    CABG  . Cancer Sister 42    Colon Ca    History  Substance Use Topics  . Smoking status: Never Smoker   . Smokeless tobacco: Never Used  . Alcohol Use: 1.0 oz/week    2 drink(s) per week     Comment: 2 drink per week.      Review of Systems At least 10pt or greater review of systems completed and are  negative except where specified in the HPI.  Allergies  Review of patient's allergies indicates no known allergies.  Home Medications   Current Outpatient Rx  Name  Route  Sig  Dispense  Refill  . clonazePAM (KLONOPIN) 2 MG tablet   Oral   Take 2 mg by mouth at bedtime.         . diphenhydrAMINE (BENADRYL) 25 MG tablet   Oral   Take 50 mg by mouth every 6 (six) hours as needed for itching.         Marland Kitchen rOPINIRole (REQUIP) 1 MG tablet   Oral   Take 1 mg by mouth at bedtime.          . sildenafil (VIAGRA) 100 MG tablet   Oral   Take 100 mg by mouth daily as needed for erectile dysfunction.           BP 170/154  Pulse 60  Temp(Src) 98.4 F (36.9 C) (Oral)  Resp 16  SpO2 99%  Physical Exam  PHYSICAL EXAM: VITAL SIGNS:  . Filed Vitals:   10/09/12 0330 10/09/12 0418 10/09/12 0430 10/09/12 0500  BP: 161/120 170/154 157/108 181/117  Pulse: 71 60 75 60  Temp:      TempSrc:  Resp: 22  19 12   SpO2: 97% 99% 98% 99%   CONSTITUTIONAL: Awake, oriented, appears non-toxic HENT: Atraumatic, normocephalic, oral mucosa pink and moist, airway patent. Nares patent without drainage. External ears normal. EYES: Conjunctiva clear, EOMI, PERRLA NECK: Trachea midline, non-tender, supple CARDIOVASCULAR: Normal heart rate, Normal rhythm, No murmurs, rubs, gallops PULMONARY/CHEST: Clear to auscultation, no rhonchi, wheezes, or rales. Symmetrical breath sounds. CHEST WALL: No lesions. Non-tender. ABDOMINAL: Non-distended, soft, non-tender - no rebound or guarding.  BS normal. NEUROLOGIC: ZO:XWRUEA fields intact. PERRLA, EOMI.  Facial sensation equal to light touch bilaterally.  Good muscle bulk in the masseter muscle and good lateral movement of the jaw.  Facial expressions equal and good strength with smile/frown and puffed cheeks.  Hearing grossly intact to finger rub test.  Uvula, tongue are midline with no deviation. Symmetrical palate elevation.  Trapezius and SCM muscles are  5/5 strength bilaterally.   DTR: Brachioradialis, biceps, patellar, Achilles tendon reflexes 2+ bilaterally.  No clonus. Strength: 5/5 strength flexors and extensors in the upper and lower extremities.  Grip strength, finger adduction/abduction 5/5. Sensation: Sensation intact distally to light touch Cerebellar: Subtle dysmetria with finger to nose on the right - left was normal, rapid alternating hand movements and heels to shin testing was unremarkable bilaterally EXTREMITIES: No clubbing, cyanosis, or edema SKIN: Warm, Dry, No erythema, No rash   ED Course  Procedures (including critical care time)  Date: 10/09/2012  Rate: 53  Rhythm: normal sinus rhythm  QRS Axis: normal  Intervals: normal  ST/T Wave abnormalities: Flattened T waves in the low lateral leads  Conduction Disutrbances: none  Narrative Interpretation: unremarkable - no significant morphological change compared to prior EKG from 05/17/2012     Labs Reviewed  CBC WITH DIFFERENTIAL - Abnormal; Notable for the following:    MCHC 36.8 (*)    Neutrophils Relative % 41 (*)    Lymphocytes Relative 49 (*)    All other components within normal limits  COMPREHENSIVE METABOLIC PANEL - Abnormal; Notable for the following:    Glucose, Bld 113 (*)    GFR calc non Af Amer 67 (*)    GFR calc Af Amer 78 (*)    All other components within normal limits  TROPONIN I   Ct Head Wo Contrast  10/09/2012   *RADIOLOGY REPORT*  Clinical Data: Right leg weakness.  Tingling.  Hypertension.  CT HEAD WITHOUT CONTRAST  Technique:  Contiguous axial images were obtained from the base of the skull through the vertex without contrast.  Comparison: No priors.  Findings: No acute intracranial abnormalities.  Specifically, no evidence of acute intracranial hemorrhage, no definite findings of acute/subacute cerebral ischemia, no mass, mass effect, hydrocephalus or abnormal intra or extra-axial fluid collections. Visualized paranasal sinuses and mastoids  are well pneumatized, with the exception of a small polypoid density in the sphenoid sinus.  No acute displaced skull fractures are identified.  IMPRESSION: 1.  No acute intracranial abnormalities. 2.  The appearance of the brain is normal. 3.  Small mucosal retention cyst or polyp in the sphenoid sinus.   Original Report Authenticated By: Trudie Reed, M.D.   Preliminary call from MRI positive for stroke on the left temporal/parietal region.  1. HTN (hypertension)   2. Unsteady gait   3. CVA (cerebral infarction)   4. Right leg weakness       MDM  Micheal Gonzalez is a 52 y.o. male presenting with dizziness as well as right leg weakness presents with hypertension appears to  be brand-new. Patient is had some borderline hypertension however does have blood pressures of 183/110 here in the emergency department. In discussion with neural hospitalist Dr. Amada Jupiter, we'll not attempt to lower blood pressure at this time secondary to worsening possible CVA.  Labs unremarkable, troponin is negative, patient has been seen by Dr. Amada Jupiter in the emergency department recommended MRI and further workup for TIA/CVA. Patient did go to MRI the morning, preliminary was called for positive for stroke-MRA was added. Discussed with hospitalist Dr. Isidoro Donning for admission to hospitalist service-patient to be admitted to the oncoming day team. Preliminary orders were placed.   Pt not a candidate for thrombolytics.         Jones Skene, MD 10/09/12 [phone]

## 2012-10-09 NOTE — Progress Notes (Signed)
VASCULAR LAB PRELIMINARY  PRELIMINARY  PRELIMINARY  PRELIMINARY  Carotid duplex completed.    Preliminary report: Bilateral - Less than 40% ICA stenosis. Vertebral artery flow is antegrade   Brynn Mulgrew, RVS 10/09/2012, 12:12 PM

## 2012-10-09 NOTE — ED Notes (Signed)
PT. REPORTS LIGHTHEADED/DIZZINESS ONSET 5 PM YESTERDAY , AMBULATORY , HYPERTENSIVE AT TRIAGE .

## 2012-10-09 NOTE — H&P (Addendum)
[facility] History and Physical  Micheal Gonzalez ZOX:[id number] DOB: 1960/07/29 DOA: 10/09/2012  Referring physician:  PCP: [name], MD  Specialists: none  Chief Complaint: Right sided weakness since yesterday.   HPI: Micheal Gonzalez is a 52 y.o. male with h/o RLS came in for lightheadedness and right side numbness over the upper extremity, heavy RLE since yesterday. He does not have any other complaints. Reports symptoms are better. He also reports he took baby aspirin yesterday and this am .  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    Review of Systems: The patient denies anorexia, fever, weight loss,, vision loss, decreased hearing, hoarseness, chest pain, syncope, dyspnea on exertion, peripheral edema, balance deficits, hemoptysis, abdominal pain, melena, hematochezia, severe indigestion/heartburn, hematuria, incontinence, genital sores, , suspicious skin lesions, transient blindness,  depression, unusual weight change, abnormal bleeding, enlarged lymph nodes, angioedema, and breast masses.   Past Medical History  Diagnosis Date  . Allergy   . ED (erectile dysfunction)   . RLS (restless legs syndrome)   . Folliculitis barbae   . Insomnia   . Insomnia    Past Surgical History  Procedure Laterality Date  . Colonoscopy  03/25/11   Social History:  reports that he has never smoked. He has never used smokeless tobacco. He reports that he drinks about 1.0 ounces of alcohol per week. He reports that he does not use illicit drugs.  where does patient live--home,No Known Allergies  Family History  Problem Relation Age of Onset  . Cancer Mother 1    Breast cancer  . Heart disease Mother 47    CABG  . Cancer Sister 41    Colon Ca    Prior to Admission medications   Medication Sig Start Date End Date Taking?  Authorizing Provider  clonazePAM (KLONOPIN) 2 MG tablet Take 2 mg by mouth at bedtime.   Yes Historical Provider, MD  diphenhydrAMINE (BENADRYL) 25 MG tablet Take 50 mg by mouth every 6 (six) hours as needed for itching.   Yes Historical Provider, MD  rOPINIRole (REQUIP) 1 MG tablet Take 1 mg by mouth at bedtime.  05/04/12  Yes Historical Provider, MD  sildenafil (VIAGRA) 100 MG tablet Take 100 mg by mouth daily as needed for erectile dysfunction.   Yes Historical Provider, MD   Physical Exam: Filed Vitals:   10/09/12 0430 10/09/12 0500 10/09/12 0726 10/09/12 0831  BP: 157/108 181/117  171/94  Pulse: 75 60  58  Temp:   97.8 F (36.6 C) 97.6 F (36.4 C)  TempSrc:    Oral  Resp: 19 12  16   SpO2: 98% 99%  96%    Constitutional: Vital signs reviewed.  Patient is a well-developed and well-nourished in no acute distress and cooperative with exam. Alert and oriented x3.  Head: Normocephalic and atraumatic Mouth: no erythema or exudates, MMM Eyes: PERRL, EOMI, conjunctivae normal, No scleral icterus.  Neck: Supple, Trachea midline normal ROM, No JVD, mass, thyromegaly, or carotid bruit present.  Cardiovascular: RRR, S1 normal, S2 normal, no MRG, pulses symmetric and intact bilaterally Pulmonary/Chest: normal respiratory effort, CTAB, no wheezes, rales, or rhonchi Abdominal: Soft. Non-tender, non-distended, bowel sounds are normal, no masses, organomegaly, or guarding present.  Musculoskeletal: No joint deformities, erythema, or stiffness, ROM full and no nontender Neurological: A&O x3, Strength is normal and symmetric bilaterally,  cranial nerve II-XII are grossly intact, no focal motor deficit, sensory intact to light touch bilaterally.      Labs on Admission:  Basic Metabolic Panel:  Recent Labs Lab 10/09/12 0159  NA 139  K 4.1  CL 104  CO2 26  GLUCOSE 113*  BUN 9  CREATININE 1.20  CALCIUM 9.4   Liver Function Tests:  Recent Labs Lab 10/09/12 0159  AST 17  ALT 13   ALKPHOS 61  BILITOT 0.4  PROT 6.8  ALBUMIN 3.9   No results found for this basename: LIPASE, AMYLASE,  in the last 168 hours No results found for this basename: AMMONIA,  in the last 168 hours CBC:  Recent Labs Lab 10/09/12 0159  WBC 6.5  NEUTROABS 2.7  HGB 15.7  HCT 42.7  MCV 91.6  PLT 165   Cardiac Enzymes:  Recent Labs Lab 10/09/12 0525  TROPONINI <0.30    BNP (last 3 results) No results found for this basename: PROBNP,  in the last 8760 hours CBG: No results found for this basename: GLUCAP,  in the last 168 hours  Radiological Exams on Admission: Ct Head Wo Contrast  10/09/2012   *RADIOLOGY REPORT*  Clinical Data: Right leg weakness.  Tingling.  Hypertension.  CT HEAD WITHOUT CONTRAST  Technique:  Contiguous axial images were obtained from the base of the skull through the vertex without contrast.  Comparison: No priors.  Findings: No acute intracranial abnormalities.  Specifically, no evidence of acute intracranial hemorrhage, no definite findings of acute/subacute cerebral ischemia, no mass, mass effect, hydrocephalus or abnormal intra or extra-axial fluid collections. Visualized paranasal sinuses and mastoids are well pneumatized, with the exception of a small polypoid density in the sphenoid sinus.  No acute displaced skull fractures are identified.  IMPRESSION: 1.  No acute intracranial abnormalities. 2.  The appearance of the brain is normal. 3.  Small mucosal retention cyst or polyp in the sphenoid sinus.   Original Report Authenticated By: Trudie Reed, M.D.   Mr [facility] Wo Contrast  10/09/2012   *RADIOLOGY REPORT*  Clinical Data:  CVA  MRI HEAD WITHOUT CONTRAST MRA HEAD WITHOUT CONTRAST  Technique:  Multiplanar, multiecho pulse sequences of the brain and surrounding structures were obtained without intravenous contrast. Angiographic images of the head were obtained using MRA technique without contrast.  Comparison:  CT head 10/09/2012  MRI HEAD  Findings:   Ventricle size is normal.    Perivascular space versus chronic lacuna in the right putamen.  Small area of acute infarct involving the left posterior temporal periventricular white matter and posterior limb internal capsule. No other acute infarct.  Brainstem and cerebellum are intact.  Negative for hemorrhage or mass lesion.  Negative for midline shift.  Mild mucosal thickening left maxillary sinus.  IMPRESSION: Small area acute deep white matter infarct on the left.  MRA HEAD  Findings: Right vertebral artery is dominant widely patent. Moderate stenosis distal left vertebral artery which is patent to the basilar.  The basilar is widely patent.  Superior cerebellar and posterior cerebral arteries are patent bilaterally.  AICA is patent bilaterally.  PICA not visualized and may be supplied by AICA.  Right cavernous carotid widely patent.  Right anterior and middle cerebral arteries are widely patent without significant stenosis.  Left cavernous carotid widely patent.  Focal stenosis in the proximal temporal branch of the left middle cerebral artery.  There is scattered diffuse disease in the middle cerebral artery branches on the left.  Left anterior cerebral  artery is widely patent.  Negative for aneurysm.  IMPRESSION: Moderate stenosis distal left vertebral artery.  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   Original Report Authenticated By: Janeece Riggers, M.D.   Mr Brain Wo Contrast  10/09/2012   *RADIOLOGY REPORT*  Clinical Data:  CVA  MRI HEAD WITHOUT CONTRAST MRA HEAD WITHOUT CONTRAST  Technique:  Multiplanar, multiecho pulse sequences of the brain and surrounding structures were obtained without intravenous contrast. Angiographic images of the head were obtained using MRA technique without contrast.  Comparison:  CT head 10/09/2012  MRI HEAD  Findings:  Ventricle size is normal.    Perivascular space versus chronic lacuna in the right putamen.  Small area of acute infarct  involving the left posterior temporal periventricular white matter and posterior limb internal capsule. No other acute infarct.  Brainstem and cerebellum are intact.  Negative for hemorrhage or mass lesion.  Negative for midline shift.  Mild mucosal thickening left maxillary sinus.  IMPRESSION: Small area acute deep white matter infarct on the left.  MRA HEAD  Findings: Right vertebral artery is dominant widely patent. Moderate stenosis distal left vertebral artery which is patent to the basilar.  The basilar is widely patent.  Superior cerebellar and posterior cerebral arteries are patent bilaterally.  AICA is patent bilaterally.  PICA not visualized and may be supplied by AICA.  Right cavernous carotid widely patent.  Right anterior and middle cerebral arteries are widely patent without significant stenosis.  Left cavernous carotid widely patent.  Focal stenosis in the proximal temporal branch of the left middle cerebral artery.  There is scattered diffuse disease in the middle cerebral artery branches on the left.  Left anterior cerebral artery is widely patent.  Negative for aneurysm.  IMPRESSION: Moderate stenosis distal left vertebral artery.  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   Original Report Authenticated By: Janeece Riggers, M.D.    EKG: sinus rhythm  Assessment/Plan Active Problems:   Unsteady gait   1. Left CVA:  - ADMIT to telemetry - CVA work up ordered.  - echo, carotid duplex ordered. Hgba1c, lipid panel pending.  - aspirin 325 mg daily,f irst dose given.  - permissive hypertension.  - RLS: hold requip.   DVT prophylaxis.   Code Status: full code Family Communication: none at bedside.  Disposition Plan: pending.   Time spent: 70 minutes.   [facility] Pager [phone]  If 7PM-7AM, please contact night-coverage [email or web address] Password [facility] 10/09/2012, 9:08 AM    Addendum:  Notified by RN, pt has more numbess  and weakness on the right side of the face, ordered repeat CT head without contrast . Repeat CT Head did not show any acute hemorrhage or extension of the CVA. NOTIFIED Dr Amada Jupiter of the events this evening. Recommended a bolus of 1 liter of NS and bed rest lying flat would improve the BP.  Kathlen Mody, MD.

## 2012-10-09 NOTE — ED Notes (Addendum)
Plan of care explained to pt. Pt communicated understanding.

## 2012-10-09 NOTE — Progress Notes (Signed)
At 1754 pt c/o numbness on R facial and increase R leg weakness.BP was 178/107.  NIH increased to 3. NIH was 1 this morning due to slight weakness on R Leg. Dr. Blake Divine paged and ordered hydralazine 5mg  q8 and repeat CT head scan. Hydralazine given and recheck bp was 129/73. Will continue to monitor pt.

## 2012-10-09 NOTE — ED Notes (Signed)
Neuro-hospitalist at bedside. Pt wil go to CT after consult.

## 2012-10-09 NOTE — Progress Notes (Signed)
Fluid bolus completed. Maintainance fluids started. Patient will remain flat throughout the night. Complains of  HA. Will page floor coverage and continue to monitor pt throughout the shift.

## 2012-10-10 ENCOUNTER — Encounter (HOSPITAL_COMMUNITY): Payer: Self-pay | Admitting: Physical Medicine and Rehabilitation

## 2012-10-10 DIAGNOSIS — I6330 Cerebral infarction due to thrombosis of unspecified cerebral artery: Secondary | ICD-10-CM

## 2012-10-10 LAB — LIPID PANEL
Cholesterol: 154 mg/dL (ref 0–200)
HDL: 40 mg/dL (ref >39)
Total CHOL/HDL Ratio: 3.9 RATIO

## 2012-10-10 LAB — HEMOGLOBIN A1C: Mean Plasma Glucose: 105 mg/dL (ref <117)

## 2012-10-10 MED ORDER — ROPINIROLE HCL 1 MG PO TABS
1.0000 mg | ORAL_TABLET | Freq: Every day | ORAL | Status: DC
  Administered 2012-10-10 – 2012-10-11 (×2): 1 mg via ORAL
  Filled 2012-10-10 (×4): qty 1

## 2012-10-10 MED ORDER — SODIUM CHLORIDE 0.9 % IV BOLUS (SEPSIS)
1000.0000 mL | Freq: Once | INTRAVENOUS | Status: AC
  Administered 2012-10-10: 1000 mL via INTRAVENOUS

## 2012-10-10 MED ORDER — ATORVASTATIN CALCIUM 10 MG PO TABS
10.0000 mg | ORAL_TABLET | Freq: Every day | ORAL | Status: DC
  Administered 2012-10-10 – 2012-10-11 (×2): 10 mg via ORAL
  Filled 2012-10-10 (×4): qty 1

## 2012-10-10 NOTE — Evaluation (Addendum)
Speech Language Pathology Evaluation Patient Details Name: Micheal Gonzalez MRN: [id number] DOB: 1961/01/13 Today's Date: 10/10/2012 Time: 4696-2952 SLP Time Calculation (min): 20 min  Problem List:  Patient Active Problem List   Diagnosis Date Noted  . Unsteady gait 10/09/2012  . HTN (hypertension) 05/31/2012  . Family history of CVA 05/17/2012  . History of renal stone 05/17/2012  . Allergic rhinitis, mild 05/17/2012  . RLS (restless legs syndrome) 01/06/2011  . Allergic rhinitis due to pollen 01/06/2011  . ED (erectile dysfunction) 01/06/2011  . Family history of colon cancer 01/06/2011  . Kidney stones 01/06/2011  . Family history of heart disease in male family member before age 39 01/06/2011   Past Medical History:  Past Medical History  Diagnosis Date  . Allergy   . ED (erectile dysfunction)   . RLS (restless legs syndrome)   . Folliculitis barbae   . Insomnia   . Insomnia    Past Surgical History:  Past Surgical History  Procedure Laterality Date  . Colonoscopy  03/25/11   HPI:  Micheal Gonzalez is a 52 y.o. male with h/o RLS came in for lightheadedness and right side numbness over the upper extremity, heavy RLE since yesterday. He does not have any other complaints. Reports symptoms are better. He also reports he took baby aspirin yesterday and this am .  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    Assessment / Plan / Recommendation Clinical Impression  Cognitive Linguistic Evaluation completed per stroke protocol.  Slight dysarthria but not adversely affecting intelligibilty.  Cognitive skills judged to be functional in current setting.  Pending results of PT/OT evaluation may benefit from Outpatient ST  vs. CIR ST consult to further assess executive functions at different levels of complexity to safely return to current  occupation.  ST to sign off as education complete.     SLP Assessment  All further Speech Lanaguage Pathology  needs can be addressed in the next venue of care    Follow Up Recommendations  Outpatient SLP        SLP Evaluation Prior Functioning  Cognitive/Linguistic Baseline: Within functional limits Type of Home: House Lives With: Alone Available Help at Discharge: Family;Available PRN/intermittently Education: PHD Vocation: Full time employment   Cognition  Overall Cognitive Status: Within Functional Limits for tasks assessed Arousal/Alertness: Awake/alert Orientation Level: Oriented X4 Attention: Focused Focused Attention: Appears intact Memory: Appears intact Awareness: Appears intact Problem Solving: Appears intact Safety/Judgment: Appears intact    Comprehension  Auditory Comprehension Overall Auditory Comprehension: Appears within functional limits for tasks assessed Visual Recognition/Discrimination Discrimination: Within Function Limits Reading Comprehension Reading Status: Within funtional limits    Expression Expression Primary Mode of Expression: Verbal Verbal Expression Overall Verbal Expression: Appears within functional limits for tasks assessed Written Expression Dominant Hand: Right Written Expression: Not tested   Oral / Motor Oral Motor/Sensory Function Overall Oral Motor/Sensory Function: Appears within functional limits for tasks assessed Motor Speech Overall Motor Speech: Appears within functional limits for tasks assessed   GO    Moreen Fowler MS, CCC-SLP [phone] [facility] 10/10/2012, 9:06 AM

## 2012-10-10 NOTE — Consult Note (Signed)
Physical Medicine and Rehabilitation Consult  Reason for Consult: Right sided weakness with numbness Referring Physician: [name].   HPI: Micheal Gonzalez is a 52 y.o. male admitted on 10/09/12 with right sided numbness with RLE  Heaviness as well as lightheadedness. 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  2D echo with EF 55-60%. Carotid dopplers without ICA stenosis.  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 PT/OT evaluations pending. Patient with right hemiparesis and difficulty walking per reports. MD recommending CIR.   Patient complains of both numbness and weakness on the right side of his body. Prior to admission was functionally independent. He is planning a trip to [employer]. Review of Systems  HENT: Negative for hearing loss and neck pain.   Eyes: Negative for blurred vision and double vision.  Respiratory: Negative for shortness of breath.   Cardiovascular: Negative for chest pain and palpitations.  Gastrointestinal: Negative for heartburn, abdominal pain and constipation.  Genitourinary: Negative for urgency and frequency.  Musculoskeletal: Negative for myalgias and back pain.  Neurological: Positive for sensory change, speech change, focal weakness and headaches.   Past Medical History  Diagnosis Date  . Allergy   . ED (erectile dysfunction)   . RLS (restless legs syndrome)   . Folliculitis barbae   . Insomnia   . Insomnia    Past Surgical History  Procedure Laterality Date  . Colonoscopy  03/25/11   Family History  Problem Relation Age of Onset  . Cancer Mother 51    Breast cancer  . Heart disease Mother 25    CABG  . Cancer Sister 78    Colon Ca  . Stroke Sister 68   Social History:  Lives alone. 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 He reports that  he has never smoked. Has friends who can check in past discharge.  He has never used smokeless tobacco. He reports that he drinks about 1.0 ounces of alcohol per week. He reports that he does not use illicit drugs.  Allergies: No Known Allergies  Medications Prior to Admission  Medication Sig Dispense Refill  . clonazePAM (KLONOPIN) 2 MG tablet Take 2 mg by mouth at bedtime.      . diphenhydrAMINE (BENADRYL) 25 MG tablet Take 50 mg by mouth every 6 (six) hours as needed for itching.      Marland Kitchen rOPINIRole (REQUIP) 1 MG tablet Take 1 mg by mouth at bedtime.       . sildenafil (VIAGRA) 100 MG tablet Take 100 mg by mouth daily as needed for erectile dysfunction.        Home: Home Living Lives With: Alone Available Help at Discharge: Family;Available PRN/intermittently Type of Home: House  Functional History: Prior Function Vocation: Full time employment Functional Status:  Mobility:          ADL:    Cognition: Cognition Overall Cognitive Status: Within Functional Limits for tasks assessed Arousal/Alertness: Awake/alert Orientation Level: Oriented X4 Attention: Focused Focused Attention: Appears intact Memory: Appears intact Awareness: Appears intact Problem Solving: Appears intact Safety/Judgment: Appears intact Cognition Arousal/Alertness: Awake/alert Overall Cognitive Status: Within Functional Limits for tasks assessed  Blood pressure 160/86, pulse 54, temperature 97.9 F (36.6 C), temperature source Oral, resp. rate 18, SpO2 100.00%.   Physical Exam  Nursing note and vitals reviewed. Constitutional: He is oriented to person, place, and time. He appears well-developed  and well-nourished.  HENT:  Head: Normocephalic and atraumatic.  Eyes: Pupils are equal, round, and reactive to light.  Neck: Normal range of motion. Neck supple.  Cardiovascular: Normal rate and regular rhythm.   No murmur heard. Pulmonary/Chest: Effort normal and breath sounds normal. No respiratory  distress.  Abdominal: Soft. Bowel sounds are normal.  Musculoskeletal: He exhibits no edema and no tenderness.  Neurological: He is alert and oriented to person, place, and time.  Mild right facial weakness with minimal dysarthria. Follows commands without difficulty. Right hemiparesis LE>UE with sensory deficits.   Skin: Skin is warm and dry.  Psychiatric: He has a normal mood and affect. His behavior is normal. Thought content normal.   motor strength is 3 minus/5 in the right finger flexors and extensors 4 minus in the right biceps and triceps 3 minus in the right deltoid 0 at the right toe extensors and toe flexors, trace at the right ankle dorsiflexor plantar flexor, 4 minus at the right quad 3 minus at the right hip flexors Left side is 5/5 throughout upper limb and lower limb Sensation identifies two out of three digits in the upper M. on the right to light touch, identifies all digits in the right lower extremity.  Results for orders placed during the hospital encounter of 10/09/12 (from the past 24 hour(s))  LIPID PANEL     Status: Abnormal   Collection Time    10/10/12  4:50 AM      Result Value Range   Cholesterol 154  0 - 200 mg/dL   Triglycerides 69  <161 mg/dL   HDL 40  >09 mg/dL   Total CHOL/HDL Ratio 3.9     VLDL 14  0 - 40 mg/dL   LDL Cholesterol 604 (*) 0 - 99 mg/dL   Dg Chest 2 View  5/40/9811   *RADIOLOGY REPORT*  Clinical Data: Acute right-sided weakness  CHEST - 2 VIEW  Comparison: None.  Findings:  Lungs clear.  Heart size and pulmonary vascularity are normal.  No adenopathy.  There is upper thoracic levoscoliosis.  IMPRESSION: No edema or consolidation.   Original Report Authenticated By: Bretta Bang, M.D.   Ct Head Wo Contrast  10/09/2012   *RADIOLOGY REPORT*  Clinical Data: Worsening right sided numbness.  CT HEAD WITHOUT CONTRAST  Technique:  Contiguous axial images were obtained from the base of the skull through the vertex without contrast.  Comparison:  10/09/2012  Findings: The patient has a known acute infarct in the left deep white matter.  Infarct is not well visualized on the CT.  No evidence for acute hemorrhage, mass lesion, midline shift or hydrocephalus.  No acute bony abnormality.  IMPRESSION: No evidence for acute hemorrhage.  The patient has a known acute infarct which is poorly characterized on CT.   Original Report Authenticated By: Richarda Overlie, M.D.   Ct Head Wo Contrast  10/09/2012   *RADIOLOGY REPORT*  Clinical Data: Right leg weakness.  Tingling.  Hypertension.  CT HEAD WITHOUT CONTRAST  Technique:  Contiguous axial images were obtained from the base of the skull through the vertex without contrast.  Comparison: No priors.  Findings: No acute intracranial abnormalities.  Specifically, no evidence of acute intracranial hemorrhage, no definite findings of acute/subacute cerebral ischemia, no mass, mass effect, hydrocephalus or abnormal intra or extra-axial fluid collections. Visualized paranasal sinuses and mastoids are well pneumatized, with the exception of a small polypoid density in the sphenoid sinus.  No acute displaced skull fractures are identified.  IMPRESSION: 1.  No acute intracranial abnormalities. 2.  The appearance of the brain is normal. 3.  Small mucosal retention cyst or polyp in the sphenoid sinus.   Original Report Authenticated By: Trudie Reed, M.D.   Mr [facility] Wo Contrast  10/09/2012   *RADIOLOGY REPORT*  Clinical Data:  CVA  MRI HEAD WITHOUT CONTRAST MRA HEAD WITHOUT CONTRAST  Technique:  Multiplanar, multiecho pulse sequences of the brain and surrounding structures were obtained without intravenous contrast. Angiographic images of the head were obtained using MRA technique without contrast.  Comparison:  CT head 10/09/2012  MRI HEAD  Findings:  Ventricle size is normal.    Perivascular space versus chronic lacuna in the right putamen.  Small area of acute infarct involving the left posterior temporal periventricular  white matter and posterior limb internal capsule. No other acute infarct.  Brainstem and cerebellum are intact.  Negative for hemorrhage or mass lesion.  Negative for midline shift.  Mild mucosal thickening left maxillary sinus.  IMPRESSION: Small area acute deep white matter infarct on the left.  MRA HEAD  Findings: Right vertebral artery is dominant widely patent. Moderate stenosis distal left vertebral artery which is patent to the basilar.  The basilar is widely patent.  Superior cerebellar and posterior cerebral arteries are patent bilaterally.  AICA is patent bilaterally.  PICA not visualized and may be supplied by AICA.  Right cavernous carotid widely patent.  Right anterior and middle cerebral arteries are widely patent without significant stenosis.  Left cavernous carotid widely patent.  Focal stenosis in the proximal temporal branch of the left middle cerebral artery.  There is scattered diffuse disease in the middle cerebral artery branches on the left.  Left anterior cerebral artery is widely patent.  Negative for aneurysm.  IMPRESSION: Moderate stenosis distal left vertebral artery.  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   Original Report Authenticated By: Janeece Riggers, M.D.   Mr Brain Wo Contrast  10/09/2012   *RADIOLOGY REPORT*  Clinical Data:  CVA  MRI HEAD WITHOUT CONTRAST MRA HEAD WITHOUT CONTRAST  Technique:  Multiplanar, multiecho pulse sequences of the brain and surrounding structures were obtained without intravenous contrast. Angiographic images of the head were obtained using MRA technique without contrast.  Comparison:  CT head 10/09/2012  MRI HEAD  Findings:  Ventricle size is normal.    Perivascular space versus chronic lacuna in the right putamen.  Small area of acute infarct involving the left posterior temporal periventricular white matter and posterior limb internal capsule. No other acute infarct.  Brainstem and cerebellum are intact.  Negative  for hemorrhage or mass lesion.  Negative for midline shift.  Mild mucosal thickening left maxillary sinus.  IMPRESSION: Small area acute deep white matter infarct on the left.  MRA HEAD  Findings: Right vertebral artery is dominant widely patent. Moderate stenosis distal left vertebral artery which is patent to the basilar.  The basilar is widely patent.  Superior cerebellar and posterior cerebral arteries are patent bilaterally.  AICA is patent bilaterally.  PICA not visualized and may be supplied by AICA.  Right cavernous carotid widely patent.  Right anterior and middle cerebral arteries are widely patent without significant stenosis.  Left cavernous carotid widely patent.  Focal stenosis in the proximal temporal branch of the left middle cerebral artery.  There is scattered diffuse disease in the middle cerebral artery branches on the left.  Left anterior cerebral artery is widely patent.  Negative for aneurysm.  IMPRESSION:  Moderate stenosis distal left vertebral artery.  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   Original Report Authenticated By: Janeece Riggers, M.D.    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 Does the need for close, 24 hr/day medical supervision in concert with the patient's rehab needs make it unreasonable for this patient to be served in a less intensive setting? Yes 2. Co-Morbidities requiring supervision/potential complications: Hypertension 3. Due to bowel management, safety, skin/wound care, disease management, medication administration, pain management and patient education, does the patient require 24 hr/day rehab nursing? Potentially 4. Does the patient require coordinated care of a physician, rehab nurse, PT (1-2 hrs/day, 5 days/week) and OT (1-2 hrs/day, 5 days/week) to address physical and functional deficits in the context of the above medical diagnosis(es)? Potentially Addressing deficits  in the following areas: balance, endurance, locomotion, strength, transferring, bowel/bladder control, bathing, dressing and toileting 5. Can the patient actively participate in an intensive therapy program of at least 3 hrs of therapy per day at least 5 days per week? Yes 6. The potential for patient to make measurable gains while on inpatient rehab is excellent 7. Anticipated functional outcomes upon discharge from inpatient rehab are modified independent mobility with PT, modified independent ADLs with OT, not applicable with SLP. 8. Estimated rehab length of stay to reach the above functional goals is: 7 days 9. Does the patient have adequate social supports to accommodate these discharge functional goals? Yes 10. Anticipated D/C setting: Home 11. Anticipated post D/C treatments: Outpt therapy 12. Overall Rehab/Functional Prognosis: good  RECOMMENDATIONS: This patient's condition is appropriate for continued rehabilitative care in the following setting: Anticipate CIR. If patient is already at supervision level with PT evaluation and then would go home with home health. Patient has agreed to participate in recommended program. Yes Note that insurance prior authorization may be required for reimbursement for recommended care.  Comment:    10/10/2012

## 2012-10-10 NOTE — Progress Notes (Signed)
Paged Dr. Amada Jupiter to notify him that the BP did not change with 2nd fluid bolus. He stated that he was not concerned since pts symptoms had not progressed and to just continue to monitor the patient.

## 2012-10-10 NOTE — Progress Notes (Signed)
I await therapy evaluations to assist with planning rehab venue options. [phone]

## 2012-10-10 NOTE — Evaluation (Signed)
Physical Therapy Evaluation Patient Details Name: Micheal Gonzalez MRN: [id number] DOB: Jun 16, 1960 Today's Date: 10/10/2012 Time: 6962-9528 PT Time Calculation (min): 35 min  PT Assessment / Plan / Recommendation Clinical Impression  52 yo presents with right limb hemiparesis severely impairing functional mobility with inabilty to ambulate at this date.  Highly recommed CIR c/s to assess appropriateness for admission in order to return to maximum functional indpendence and return to work.  Plan as below    PT Assessment  Patient needs continued PT services    Follow Up Recommendations  CIR    Does the patient have the potential to tolerate intense rehabilitation      Barriers to Discharge Decreased caregiver support per patient he has family/friends to assist at d/c    Equipment Recommendations  Other (comment) (TBD)    Recommendations for Other Services Rehab consult   Frequency Min 4X/week    Precautions / Restrictions Precautions Precautions: Fall Precaution Comments: right LE weak   Pertinent Vitals/Pain No pain reported      Mobility  Bed Mobility Bed Mobility: Supine to Sit Supine to Sit: 4: Min assist;With rails;HOB elevated Details for Bed Mobility Assistance: physical assist to raise and steady trunk, to manage RUE and RLE to Left EOB  Transfers Transfers: Sit to Stand;Stand to Sit;Stand Pivot Transfers Sit to Stand: 3: Mod assist;From elevated surface;With upper extremity assist;From bed Stand to Sit: 3: Mod assist;With upper extremity assist;With armrests;To chair/3-in-1 Stand Pivot Transfers: 3: Mod assist (bed to chair, 90 degree to left) Details for Transfer Assistance: physical assist to brace and protect Right hemibody, block knee, verbal cues to maintain extension (able throughout with min cues); instruction to preferentially load bear through left limb; right UE neglected.  Left in 90/90 sitting with feet supported to promote sensory input and  return Ambulation/Gait Ambulation/Gait Assistance: Not tested (comment) (unsafe at this point; 3 side steps mod assist to left) Modified Rankin (Stroke Patients Only) Pre-Morbid Rankin Score: No symptoms Modified Rankin: Moderately severe disability    Exercises General Exercises - Lower Extremity Ankle Circles/Pumps: AROM;AAROM;Both;5 reps;10 reps;Seated;Supine (perform bilateral for maximum overflow to hemi limb) Long Arc Quad: AROM;AAROM;Both;5 reps;Seated (perform 5x/hour )   PT Diagnosis: Hemiplegia dominant side  PT Problem List: Decreased strength;Decreased range of motion;Decreased balance;Decreased mobility;Impaired sensation PT Treatment Interventions: DME instruction;Gait training;Functional mobility training;Therapeutic activities;Therapeutic exercise;Balance training;Neuromuscular re-education;Wheelchair mobility training   PT Goals Acute Rehab PT Goals PT Goal Formulation: With patient Time For Goal Achievement: 10/24/12 Potential to Achieve Goals: Good Pt will go Supine/Side to Sit: with supervision;with HOB 0 degrees;with rail PT Goal: Supine/Side to Sit - Progress: Goal set today Pt will go Sit to Supine/Side: with supervision;with HOB 0 degrees;with rail PT Goal: Sit to Supine/Side - Progress: Goal set today Pt will go Sit to Stand: with supervision;from elevated surface;with upper extremity assist PT Goal: Sit to Stand - Progress: Goal set today Pt will go Stand to Sit: with supervision;to elevated surface;with upper extremity assist PT Goal: Stand to Sit - Progress: Goal set today Pt will Transfer Bed to Chair/Chair to Bed: with min assist PT Transfer Goal: Bed to Chair/Chair to Bed - Progress: Goal set today Pt will Stand: with supervision;3 - 5 min;with unilateral upper extremity support PT Goal: Stand - Progress: Goal set today Pt will Ambulate: 16 - 50 feet;with min assist;with least restrictive assistive device PT Goal: Ambulate - Progress: Goal set  today  Visit Information  Last PT Received On: 10/10/12 Assistance Needed: +1 (will need +  2 for ambulation)    Subjective Data  Subjective: Feels good to be out of that bed Patient Stated Goal: back to normal   Prior Functioning  Home Living Lives With: Alone Available Help at Discharge: Family;Available PRN/intermittently Type of Home: House Home Access: Level entry Home Layout: One level Prior Function Level of Independence: Independent Able to Take Stairs?: Yes Driving: Yes Vocation: Full time employment Communication Communication: No difficulties Dominant Hand: Right    Cognition  Cognition Arousal/Alertness: Awake/alert Behavior During Therapy: WFL for tasks assessed/performed Overall Cognitive Status: Within Functional Limits for tasks assessed    Extremity/Trunk Assessment Right Upper Extremity Assessment RUE ROM/Strength/Tone: Deficits RUE ROM/Strength/Tone Deficits: defer to OT RUE Sensation: Deficits RUE Sensation Deficits: defer to OT Left Upper Extremity Assessment LUE ROM/Strength/Tone: WFL for tasks assessed Right Lower Extremity Assessment RLE ROM/Strength/Tone: Deficits RLE ROM/Strength/Tone Deficits: partial ROM with effort knee extension in sitting; 0/5 ankle; adduction functional for side stepping RLE Sensation: Deficits RLE Sensation Deficits: light touch throughout diminished vs. left Left Lower Extremity Assessment LLE ROM/Strength/Tone: WFL for tasks assessed LLE Sensation: WFL - Light Touch   Balance Balance Balance Assessed: Yes Static Sitting Balance Static Sitting - Level of Assistance: 5: Stand by assistance Static Standing Balance Static Standing - Level of Assistance: 3: Mod assist  End of Session PT - End of Session Equipment Utilized During Treatment: Gait belt Activity Tolerance: Patient tolerated treatment well Patient left: in chair;with call bell/phone within reach Nurse Communication: Mobility status  GP     Dennis Bast 10/10/2012, 2:08 PM

## 2012-10-10 NOTE — Progress Notes (Signed)
Stroke Team Progress Note  HISTORY Micheal Gonzalez is a 52 y.o. male with a history of hypertension, not on treatment, who presentson 10/09/2012  with lightheadedness and difficulty walking. He states that this started yesterday around 4 pm. It has been persistent since that time. He has not noticed any difficulty with hand movements. He has not had any true vertigo, headache, nausea/vomiting. He states that his BP does run high, but has been "watching it" with his PCP. He does not check it regularly due to a broken cuff. Patient was not a TPA candidate secondary to delay in arrival. He was admitted for further evaluation and treatment.  SUBJECTIVE No family is at the bedside.  Overall he feels his condition is stable. He is having difficulty walking - therapy consults pending. He lived alone prior to admission.  OBJECTIVE Most recent Vital Signs: Filed Vitals:   10/09/12 2150 10/10/12 0153 10/10/12 0330 10/10/12 0612  BP: 150/91 127/76 126/83 139/82  Pulse:  67 58 66  Temp:  98 F (36.7 C)  98.1 F (36.7 C)  TempSrc:  Oral  Oral  Resp:  20  20  SpO2:  97%  99%   CBG (last 3)  No results found for this basename: GLUCAP,  in the last 72 hours  IV Fluid Intake:   . sodium chloride 50 mL/hr at 10/09/12 2201    MEDICATIONS  . aspirin  325 mg Oral Daily  . clonazePAM  2 mg Oral QHS  . enoxaparin (LOVENOX) injection  40 mg Subcutaneous Q24H   PRN:  acetaminophen, hydrALAZINE, senna-docusate  Diet:  Cardiac thin liquids Activity:   Up with assistance DVT Prophylaxis:  Lovenox 40 mg sq daily   CLINICALLY SIGNIFICANT STUDIES Basic Metabolic Panel:  Recent Labs Lab 10/09/12 0159  NA 139  K 4.1  CL 104  CO2 26  GLUCOSE 113*  BUN 9  CREATININE 1.20  CALCIUM 9.4   Liver Function Tests:  Recent Labs Lab 10/09/12 0159  AST 17  ALT 13  ALKPHOS 61  BILITOT 0.4  PROT 6.8  ALBUMIN 3.9   CBC:  Recent Labs Lab 10/09/12 0159  WBC 6.5  NEUTROABS 2.7  HGB 15.7  HCT 42.7   MCV 91.6  PLT 165   Coagulation: No results found for this basename: LABPROT, INR,  in the last 168 hours Cardiac Enzymes:  Recent Labs Lab 10/09/12 0525  TROPONINI <0.30   Urinalysis: No results found for this basename: COLORURINE, APPERANCEUR, LABSPEC, PHURINE, GLUCOSEU, HGBUR, BILIRUBINUR, KETONESUR, PROTEINUR, UROBILINOGEN, NITRITE, LEUKOCYTESUR,  in the last 168 hours Lipid Panel    Component Value Date/Time   CHOL 154 10/10/2012 0450   TRIG 69 10/10/2012 0450   HDL 40 10/10/2012 0450   CHOLHDL 3.9 10/10/2012 0450   VLDL 14 10/10/2012 0450   LDLCALC 100* 10/10/2012 0450   HgbA1C  No results found for this basename: HGBA1C    Urine Drug Screen:   No results found for this basename: labopia, cocainscrnur, labbenz, amphetmu, thcu, labbarb    Alcohol Level: No results found for this basename: ETH,  in the last 168 hours  CT of the brain   10/09/2012   No evidence for acute hemorrhage.  The patient has a known acute infarct which is poorly characterized on CT.  10/09/2012   1.  No acute intracranial abnormalities. 2.  The appearance of the brain is normal. 3.  Small mucosal retention cyst or polyp in the sphenoid sinus.   MRI of the  brain  10/09/2012   Small area acute deep white matter infarct on the left.   MRA of the brain  10/09/2012  Moderate stenosis distal left vertebral artery.  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  2D Echocardiogram   Study Conclusions  Left ventricle: The cavity size was normal. Systolic function was normal. The estimated ejection fraction was in the range of 55% to 60%. Wall motion was normal; there were no regional wall motion abnormalities.  Carotid Doppler   Summary:  - Technically difficult due to high bifurcations at the jaw line. - Bilateral- 0 - 39% ICA stenosis. Vertebral artey flow is antegrade.    CXR   IMPRESSION:  No edema or consolidation  EKG  sinus bradycardia.   Therapy  Recommendations   Physical Exam  General: The patient is alert and cooperative at the time of the examination.  Skin: No significant peripheral edema is noted.   Neurologic Exam  Cranial nerves: Facial symmetry is present. Speech is slightly dysarthric, not aphasic. Extraocular movements are full. Visual fields are full.  Motor: The patient has good strength in the left extremities extremities. On the right, the patient has 4/5 strength of the right arm and leg  Coordination: The patient has good finger-nose-finger and heel-to-shin on the left, dysmetria on the right.  Gait and station: The gait was not tested. The patient has right upper extremity drift.  Reflexes: Deep tendon reflexes are symmetric.      ASSESSMENT [name] is a 52 y.o. male presenting with lightheadedness and difficulty walking. 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  On no antithrombotics prior to admission. Now on aspirin 325 mg orally every day for secondary stroke prevention. Patient with resultant right hemiparesis. Work up completed.   Hyperlipidemia, LDL 100, on no statin PTA, now on no statin, goal LDL < 100 (< 70 for diabetics)  Hypertension   Hospital day # 1  TREATMENT/PLAN  Continue aspirin 325 mg orally every day for secondary stroke prevention.  Therapy evals.   Likely rehab at discharge  [name], MSN, RN, ANVP-BC, ANP-BC, [facility] Pager: [phone] 10/10/2012 10:03 AM  I have personally obtained a history, examined the patient, evaluated imaging results, and formulated the assessment and plan of care. I agree with the above. Lesly Dukes

## 2012-10-10 NOTE — Progress Notes (Signed)
Paged Dr. Amada Jupiter concerning drop in BP. Orders given. Will continue to monitor.

## 2012-10-10 NOTE — Progress Notes (Signed)
[facility] PROGRESS NOTE  Micheal Gonzalez RUE:[id number] DOB: 03/06/1961 DOA: 10/09/2012 PCP: [name], MD  Assessment/Plan: 1. CVA; MRI of head showed Small area acute deep white matter infarct on the left. Per neurology patient was started on 325 mg of aspirin. Patient awaiting bed in CIR. 2. HTN; patient's blood pressure has been running high throughout the day. We'll continue to allow patient's blood pressure run high. Will the order to page neurologist or night hospitalist if SBP> 180 or DBP > 110 to determine if any BP medications will be administered.  Code Status: Full Family Communication: None  Disposition Plan: In a.m. Will determine at multidisciplinary rounds if patient has CIR bed Consultants: Neurology, physical medicine and rehabilitation Procedures:  Head CT, MRA/MRI of head without contrast; see results below  Antibiotics:  None  HPI/Subjective: 52 y.o. BM PMHx HTN, not on treatment, who presentson 10/09/2012 with lightheadedness and difficulty walking. He states that this started yesterday around 4 pm. It has been persistent since that time. He has not noticed any difficulty with hand movements. He has not had any true vertigo, headache, nausea/vomiting. He states that his BP does run high, but has been "watching it" with his PCP. He does not check it regularly due to a broken cuff. Patient was not a TPA candidate secondary to delay in arrival. He was admitted for further evaluation and treatment. TODAY he states continued increasing right-sided weakness, numbness, right facial numbness, right tongue numbness. Negative vision change. Negative nausea vomiting, negative headache.      Objective: Filed Vitals:   10/10/12 1019 10/10/12 1432 10/10/12 1435 10/10/12 1903  BP: 160/86 168/111 164/113 159/102  Pulse: 54  74 74  Temp: 97.9 F (36.6 C)   97.6 F (36.4 C)  TempSrc: Oral Oral Oral Oral  Resp: 18 18 18 18   SpO2: 100%  98% 98%     Intake/Output Summary (Last 24 hours) at 10/10/12 1958 Last data filed at 10/10/12 1901  Gross per 24 hour  Intake 1599.17 ml  Output   2250 ml  Net -650.83 ml   There were no vitals filed for this visit.  Exam:   General: Alert and oriented x4  Cardiovascular: RRR, (-) M/R/G, DP/PT pulse +2  Respiratory:CTA Bilat Neurologic exam; cranial nerves II through XII intact(negative Uvula or tongue deviation), positive slurred speech, positive right facial weakness, positive right-sided weakness upper extremity and lower extremity. Motor strength upper extremity and lower extremity 4/5. Remainder of exam omitted secondary to patient being unable to exit safely.   Data Reviewed: Basic Metabolic Panel:  Recent Labs Lab 10/09/12 0159  NA 139  K 4.1  CL 104  CO2 26  GLUCOSE 113*  BUN 9  CREATININE 1.20  CALCIUM 9.4   Liver Function Tests:  Recent Labs Lab 10/09/12 0159  AST 17  ALT 13  ALKPHOS 61  BILITOT 0.4  PROT 6.8  ALBUMIN 3.9   No results found for this basename: LIPASE, AMYLASE,  in the last 168 hours No results found for this basename: AMMONIA,  in the last 168 hours CBC:  Recent Labs Lab 10/09/12 0159  WBC 6.5  NEUTROABS 2.7  HGB 15.7  HCT 42.7  MCV 91.6  PLT 165   Cardiac Enzymes:  Recent Labs Lab 10/09/12 0525  TROPONINI <0.30   BNP (last 3 results) No results found for this basename: PROBNP,  in the last 8760 hours CBG: No results found for this basename: GLUCAP,  in the last 168 hours  No results found for this or any previous visit (from the past 240 hour(s)).   Studies: Dg Chest 2 View  10/09/2012   *RADIOLOGY REPORT*  Clinical Data: Acute right-sided weakness  CHEST - 2 VIEW  Comparison: None.  Findings:  Lungs clear.  Heart size and pulmonary vascularity are normal.  No adenopathy.  There is upper thoracic levoscoliosis.  IMPRESSION: No edema or consolidation.   Original Report Authenticated By: Bretta Bang, M.D.   Ct Head  Wo Contrast  10/09/2012   *RADIOLOGY REPORT*  Clinical Data: Worsening right sided numbness.  CT HEAD WITHOUT CONTRAST  Technique:  Contiguous axial images were obtained from the base of the skull through the vertex without contrast.  Comparison: 10/09/2012  Findings: The patient has a known acute infarct in the left deep white matter.  Infarct is not well visualized on the CT.  No evidence for acute hemorrhage, mass lesion, midline shift or hydrocephalus.  No acute bony abnormality.  IMPRESSION: No evidence for acute hemorrhage.  The patient has a known acute infarct which is poorly characterized on CT.   Original Report Authenticated By: Richarda Overlie, M.D.   Ct Head Wo Contrast  10/09/2012   *RADIOLOGY REPORT*  Clinical Data: Right leg weakness.  Tingling.  Hypertension.  CT HEAD WITHOUT CONTRAST  Technique:  Contiguous axial images were obtained from the base of the skull through the vertex without contrast.  Comparison: No priors.  Findings: No acute intracranial abnormalities.  Specifically, no evidence of acute intracranial hemorrhage, no definite findings of acute/subacute cerebral ischemia, no mass, mass effect, hydrocephalus or abnormal intra or extra-axial fluid collections. Visualized paranasal sinuses and mastoids are well pneumatized, with the exception of a small polypoid density in the sphenoid sinus.  No acute displaced skull fractures are identified.  IMPRESSION: 1.  No acute intracranial abnormalities. 2.  The appearance of the brain is normal. 3.  Small mucosal retention cyst or polyp in the sphenoid sinus.   Original Report Authenticated By: Trudie Reed, M.D.   [name] Wo Contrast  10/09/2012   *RADIOLOGY REPORT*  Clinical Data:  CVA  MRI HEAD WITHOUT CONTRAST MRA HEAD WITHOUT CONTRAST  Technique:  Multiplanar, multiecho pulse sequences of the brain and surrounding structures were obtained without intravenous contrast. Angiographic images of the head were obtained using MRA technique  without contrast.  Comparison:  CT head 10/09/2012  MRI HEAD  Findings:  Ventricle size is normal.    Perivascular space versus chronic lacuna in the right putamen.  Small area of acute infarct involving the left posterior temporal periventricular white matter and posterior limb internal capsule. No other acute infarct.  Brainstem and cerebellum are intact.  Negative for hemorrhage or mass lesion.  Negative for midline shift.  Mild mucosal thickening left maxillary sinus.  IMPRESSION: Small area acute deep white matter infarct on the left.  MRA HEAD  Findings: Right vertebral artery is dominant widely patent. Moderate stenosis distal left vertebral artery which is patent to the basilar.  The basilar is widely patent.  Superior cerebellar and posterior cerebral arteries are patent bilaterally.  AICA is patent bilaterally.  PICA not visualized and may be supplied by AICA.  Right cavernous carotid widely patent.  Right anterior and middle cerebral arteries are widely patent without significant stenosis.  Left cavernous carotid widely patent.  Focal stenosis in the proximal temporal branch of the left middle cerebral artery.  There is scattered diffuse disease in the middle cerebral artery branches on the left.  Left anterior cerebral artery is widely patent.  Negative for aneurysm.  IMPRESSION: Moderate stenosis distal left vertebral artery.  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   Original Report Authenticated By: Janeece Riggers, M.D.   Mr Brain Wo Contrast  10/09/2012   *RADIOLOGY REPORT*  Clinical Data:  CVA  MRI HEAD WITHOUT CONTRAST MRA HEAD WITHOUT CONTRAST  Technique:  Multiplanar, multiecho pulse sequences of the brain and surrounding structures were obtained without intravenous contrast. Angiographic images of the head were obtained using MRA technique without contrast.  Comparison:  CT head 10/09/2012  MRI HEAD  Findings:  Ventricle size is normal.    Perivascular space  versus chronic lacuna in the right putamen.  Small area of acute infarct involving the left posterior temporal periventricular white matter and posterior limb internal capsule. No other acute infarct.  Brainstem and cerebellum are intact.  Negative for hemorrhage or mass lesion.  Negative for midline shift.  Mild mucosal thickening left maxillary sinus.  IMPRESSION: Small area acute deep white matter infarct on the left.  MRA HEAD  Findings: Right vertebral artery is dominant widely patent. Moderate stenosis distal left vertebral artery which is patent to the basilar.  The basilar is widely patent.  Superior cerebellar and posterior cerebral arteries are patent bilaterally.  AICA is patent bilaterally.  PICA not visualized and may be supplied by AICA.  Right cavernous carotid widely patent.  Right anterior and middle cerebral arteries are widely patent without significant stenosis.  Left cavernous carotid widely patent.  Focal stenosis in the proximal temporal branch of the left middle cerebral artery.  There is scattered diffuse disease in the middle cerebral artery branches on the left.  Left anterior cerebral artery is widely patent.  Negative for aneurysm.  IMPRESSION: Moderate stenosis distal left vertebral artery.  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   Original Report Authenticated By: Janeece Riggers, M.D.    Scheduled Meds: . aspirin  325 mg Oral Daily  . atorvastatin  10 mg Oral q1800  . clonazePAM  2 mg Oral QHS  . enoxaparin (LOVENOX) injection  40 mg Subcutaneous Q24H   Continuous Infusions: . sodium chloride 50 mL/hr at 10/09/12 2201    Active Problems:   Unsteady gait    Time spent:   Jaquell Seddon, J  [facility] Pager [phone]. If 7PM-7AM, please contact night-coverage at [email or web address], password [facility] 10/10/2012, 7:58 PM  LOS: 1 day

## 2012-10-10 NOTE — Evaluation (Signed)
Occupational Therapy Evaluation Patient Details Name: Micheal Gonzalez MRN: [id number] DOB: [birth year] Today's Date: 10/10/2012 Time: 4098-1191 OT Time Calculation (min): 33 min  OT Assessment / Plan / Recommendation Clinical Impression   This 52 y.o. Male admitted with Rt. Sided numbness and Rt. LE heaviness.  MRI revealed infarct Lt. Posterior temporal and posterior limb internal capsule.  Pt presents to OT with Rt. (dominant side) hemiparesis.  Pt is very motivated.  Pt will benefit from OT for the below listed deficits,  to maximize safety and independence with BADLs to allow him to return home with family  at supervision level.  Pt will be an excellent candidate for CIR.    OT Assessment  Patient needs continued OT Services    Follow Up Recommendations  CIR    Barriers to Discharge Decreased caregiver support    Equipment Recommendations  None recommended by OT    Recommendations for Other Services Rehab consult  Frequency  Min 2X/week    Precautions / Restrictions Precautions Precautions: Fall       ADL  Eating/Feeding: Set up (using Lt. hand) Where Assessed - Eating/Feeding: Chair Grooming: Wash/dry hands;Wash/dry face;Teeth care;Set up (using Lt. Ue) Where Assessed - Grooming: Supported sitting Upper Body Bathing: Minimal assistance Where Assessed - Upper Body Bathing: Supported sitting Lower Body Bathing: Moderate assistance Where Assessed - Lower Body Bathing: Supported sit to stand Upper Body Dressing: Minimal assistance Where Assessed - Upper Body Dressing: Unsupported sitting Lower Body Dressing: Maximal assistance Where Assessed - Lower Body Dressing: Supported sit to stand Toilet Transfer: Moderate assistance Toilet Transfer Method: Sit to stand;Stand pivot Toilet Transfer Equipment: Bedside commode Toileting - Clothing Manipulation and Hygiene: Moderate assistance Where Assessed - Toileting Clothing Manipulation and Hygiene:  Standing Transfers/Ambulation Related to ADLs: sit to stand with mod A.  Facilitation for hip control and to prevent Rt. knee from buckling ADL Comments: Pt donned socks with mod A.  Pt consistenly attempting to incorporate Rt.hand.  Pt. with min A for dynamic sitting balance.     OT Diagnosis: Generalized weakness;Hemiplegia dominant side  OT Problem List: Decreased strength;Decreased range of motion;Impaired balance (sitting and/or standing);Decreased coordination;Decreased knowledge of use of DME or AE;Impaired tone;Impaired UE functional use OT Treatment Interventions: Self-care/ADL training;Neuromuscular education;DME and/or AE instruction;Manual therapy;Therapeutic activities;Patient/family education;Balance training   OT Goals Acute Rehab OT Goals OT Goal Formulation: With patient Time For Goal Achievement: 10/17/12 Potential to Achieve Goals: Good ADL Goals Pt Will Perform Eating: with mod assist;Sitting, chair;with adaptive utensils (using Rt. UE) ADL Goal: Eating - Progress: Goal set today Pt Will Perform Grooming: with min assist;Standing at sink ADL Goal: Grooming - Progress: Goal set today Pt Will Perform Upper Body Bathing: with set-up;with supervision;Sitting, chair;Sitting, edge of bed ADL Goal: Upper Body Bathing - Progress: Goal set today Pt Will Perform Lower Body Bathing: with min assist;Sit to stand from chair;Sit to stand from bed ADL Goal: Lower Body Bathing - Progress: Goal set today Pt Will Perform Upper Body Dressing: with supervision;with set-up;Sitting, chair;Sitting, bed ADL Goal: Upper Body Dressing - Progress: Goal set today Pt Will Perform Lower Body Dressing: with min assist;Sit to stand from chair;Sit to stand from bed ADL Goal: Lower Body Dressing - Progress: Goal set today Pt Will Transfer to Toilet: with min assist;Ambulation;with DME ADL Goal: Toilet Transfer - Progress: Goal set today Pt Will Perform Toileting - Clothing Manipulation: with min  assist;Standing ADL Goal: Toileting - Clothing Manipulation - Progress: Goal set today Pt Will Perform Toileting -  Hygiene: with supervision;Sitting on 3-in-1 or toilet ADL Goal: Toileting - Hygiene - Progress: Goal set today Additional ADL Goal #1: Pt will use Rt. UE as an active assist consistently during ADLs ADL Goal: Additional Goal #1 - Progress: Goal set today Additional ADL Goal #2: Pt will be independent with HEP ADL Goal: Additional Goal #2 - Progress: Goal set today  Visit Information  Last OT Received On: 10/10/12 Assistance Needed: +1    Subjective Data  Subjective: I thought I was having a stroke Patient Stated Goal: To get better   Prior Functioning     Home Living Lives With: Alone Available Help at Discharge: Family;Available PRN/intermittently Type of Home: House Home Access: Level entry Home Layout: One level Bathroom Toilet: Standard Home Adaptive Equipment: None Prior Function Level of Independence: Independent Able to Take Stairs?: Yes Driving: Yes Vocation: Full time employment Comments: 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; teaches at [employer] Communication: No difficulties Dominant Hand: Right         Vision/Perception Vision - History Baseline Vision: Wears glasses all the time Patient Visual Report: No change from baseline Vision - Assessment Eye Alignment: Within Functional Limits Vision Assessment: Vision tested Ocular Range of Motion: Within Functional Limits Tracking/Visual Pursuits: Able to track stimulus in all quads without difficulty Visual Fields: No apparent deficits Perception Perception: Within Functional Limits Praxis Praxis: Intact   Cognition  Cognition Arousal/Alertness: Awake/alert Behavior During Therapy: WFL for tasks assessed/performed Overall Cognitive Status: Within Functional Limits for tasks assessed    Extremity/Trunk Assessment Right Upper Extremity  Assessment RUE ROM/Strength/Tone: Deficits RUE ROM/Strength/Tone Deficits: Brunnstrom beginning stage 4.  Hand to mouth, able to elevate shoulder to ~50 using synergy.  Pt demonstrates grasp with sluggish release.  Able to opose index finger RUE Sensation: Deficits RUE Sensation Deficits: Pt indicates numbness Rt hand, but consistenly attempts to incorporate it into activity RUE Coordination: Deficits Left Upper Extremity Assessment LUE ROM/Strength/Tone: WFL for tasks assessed LUE Sensation: WFL - Light Touch LUE Coordination: WFL - gross/fine motor Trunk Assessment Trunk Assessment: Other exceptions Trunk Exceptions: Pt with lateral extension Rt. trunk with posterior flaring of Rt. rib cage     Mobility Bed Mobility Bed Mobility: Not assessed Transfers Transfers: Sit to Stand;Stand to Sit Sit to Stand: 3: Mod assist;With upper extremity assist;From chair/3-in-1 Stand to Sit: 3: Mod assist;With upper extremity assist;To chair/3-in-1 Details for Transfer Assistance: Facilitation at hip and knee as well as at ankle to prevent injury     Exercise Other Exercises Other Exercises: sliding Rt. hand on Knee to promote isolated shoulder flexion Other Exercises: Finger extension.  Instructed him to avoid flexion actively Other Exercises: Thumb extension   Balance Balance Balance Assessed: Yes Dynamic Sitting Balance Dynamic Sitting - Balance Support: Feet unsupported;No upper extremity supported Dynamic Sitting - Level of Assistance: 4: Min assist Dynamic Sitting Balance - Compensations: donning socks.  Pt with LOB to Lt. requiring min a to correct   End of Session OT - End of Session Activity Tolerance: Patient tolerated treatment well Patient left: in chair;with call bell/phone within reach  GO     Johnmichael Melhorn M 10/10/2012, 7:21 PM

## 2012-10-11 DIAGNOSIS — I639 Cerebral infarction, unspecified: Secondary | ICD-10-CM

## 2012-10-11 DIAGNOSIS — R29898 Other symptoms and signs involving the musculoskeletal system: Secondary | ICD-10-CM

## 2012-10-11 MED ORDER — SODIUM CHLORIDE 0.9 % IV BOLUS (SEPSIS)
500.0000 mL | Freq: Once | INTRAVENOUS | Status: AC
  Administered 2012-10-11: 500 mL via INTRAVENOUS

## 2012-10-11 NOTE — Progress Notes (Signed)
Physical Therapy Treatment Patient Details Name: Micheal Gonzalez MRN: [id number] DOB: 1960/09/07 Today's Date: 10/11/2012 Time: 1010-1041 PT Time Calculation (min): 31 min  PT Assessment / Plan / Recommendation Comments on Treatment Session  Patient is highly motivated and making great progress. Continue to recommend CIR as the best DC option as he would highly benefit and I would expect great gains in rehab potential    Follow Up Recommendations  CIR     Does the patient have the potential to tolerate intense rehabilitation     Barriers to Discharge        Equipment Recommendations       Recommendations for Other Services Rehab consult  Frequency Min 4X/week   Plan Discharge plan remains appropriate;Frequency remains appropriate    Precautions / Restrictions Precautions Precaution Comments: right LE weak   Pertinent Vitals/Pain     Mobility  Bed Mobility Supine to Sit: 4: Min guard Details for Bed Mobility Assistance: Minguard for safety. Increased time and cues given Transfers Sit to Stand: 4: Min assist;With armrests;From chair/3-in-1;From bed Stand to Sit: 4: Min assist;With armrests;To chair/3-in-1 Details for Transfer Assistance: Facilitation at hip and knee as well as at ankle to prevent injury. Patient stood x6 for endurance, strengthening and practiced. Making great progress. Cues to find mindline with standing Ambulation/Gait Ambulation/Gait Assistance: 1: +2 Total assist Ambulation/Gait: Patient Percentage: 50% Ambulation Distance (Feet): 6 Feet Assistive device: 2 person hand held assist Ambulation/Gait Assistance Details: A in large part for balance and weight shifted. Patient required A to prevent hyperextension and positoning of R LE. Heavy R lean at times. One seated break. Patient attempted to position R LE without assistance but adducting heavily Gait Pattern: Step-to pattern;Decreased weight shift to right;Decreased weight shift to left Gait  velocity: greatly decreased Modified Rankin (Stroke Patients Only) Pre-Morbid Rankin Score: Moderately severe disability    Exercises     PT Diagnosis:    PT Problem List:   PT Treatment Interventions:     PT Goals Acute Rehab PT Goals PT Goal: Supine/Side to Sit - Progress: Progressing toward goal PT Goal: Sit to Stand - Progress: Progressing toward goal PT Goal: Stand to Sit - Progress: Progressing toward goal PT Transfer Goal: Bed to Chair/Chair to Bed - Progress: Progressing toward goal PT Goal: Stand - Progress: Progressing toward goal PT Goal: Ambulate - Progress: Progressing toward goal  Visit Information  Last PT Received On: 10/11/12 Assistance Needed: +2    Subjective Data      Cognition  Cognition Arousal/Alertness: Awake/alert Behavior During Therapy: WFL for tasks assessed/performed Overall Cognitive Status: Within Functional Limits for tasks assessed    Balance  Balance Balance Assessed: Yes Static Sitting Balance Static Sitting - Level of Assistance: 5: Stand by assistance Static Standing Balance Static Standing - Level of Assistance: 3: Mod assist Static Standing - Comment/# of Minutes: Worked on reaching and weight shifting x10 mins  End of Session PT - End of Session Equipment Utilized During Treatment: Gait belt Activity Tolerance: Patient tolerated treatment well Patient left: in chair;with call bell/phone within reach   GP     Renaye Janicki, Adline Potter 10/11/2012, 12:13 PM 10/11/2012 Fredrich Birks PTA [phone] pager [phone] office

## 2012-10-11 NOTE — Progress Notes (Signed)
Dr. Thad Ranger called and will review patient and let me know.  Will continue to monitor.  Lance Bosch, RN

## 2012-10-11 NOTE — Progress Notes (Signed)
Dr. Thad Ranger said to give apresoline 5mg .  Lance Bosch, RN

## 2012-10-11 NOTE — Progress Notes (Signed)
RUE 4/5 yesterday. 3/5 during Dr. Anne Hahn exam at 1011 this am. BP 159/112 at 1101 treated with apresoline 5 mg at 1153. At 1356 received phone call from OT reporting right arm now flaccid. No other neuro complications. Worsening likely normal progression of small vessel stroke, though could be related to BP lowering. Will bolus with 500cc NS and asked to lay flat til am to encourage perfusion - ok to be up for meals/bathroom if stable.   Ok for planned transfer to rehab from stroke standpoint. Will follow up in am if remains in hospital.  Alerted and discussed with [name], Dr. Anne Hahn,  Rehab admissions RN.  [name], MSN, RN, ANVP-BC, ANP-BC, [facility] Pager: [phone] 10/11/2012 2:47 PM  I have personally obtained a history, examined the patient, evaluated imaging results, and formulated the assessment and plan of care. I agree with the above. Lesly Dukes

## 2012-10-11 NOTE — Progress Notes (Signed)
Occupational Therapy Treatment Patient Details Name: Micheal Gonzalez MRN: [id number] DOB: 08/17/1960 Today's Date: 10/11/2012 Time: 4098-1191 OT Time Calculation (min): 17 min  OT Assessment / Plan / Recommendation Comments on Treatment Session  Pt with significantly less movement Rt. UE today compared to yesterday.  Pt now Brunnstrom stage II with hand stage I; yesterday Rt. UE Brunnstrom stage IV.  Alerted RN, NP, and MD.  Pt returned to supine position per NP recommendation.  BP 155/101    Follow Up Recommendations  CIR    Barriers to Discharge       Equipment Recommendations  None recommended by OT    Recommendations for Other Services Rehab consult  Frequency Min 2X/week   Plan Discharge plan remains appropriate (goals modified due to Rt. UE weakness)    Precautions / Restrictions Precautions Precautions: Fall Precaution Comments: right LE weak   Pertinent Vitals/Pain     ADL  ADL Comments: Pt sitting up in chair, states he is weaker than yesterday and that he didn't sleep well.  Pt now with Rt. UE movement Brunnstrom stage II (was stage IV yesterday).  Pt with minimal shoulder shrug today with very minimal elbow flexion, but unable to elicit further movement of Rt. UE.  Hand flaccid.  Yesterday, he was able to move Rt. hand to mouth, shoulder flexion to 50-60*; demonstrated gross grasp with release and was able to extend thumb.   BP 155/101.  RN notified of change.  Also contacted [name], NP, and [name] briefly was in the room, and is aware of the changes.  [name] suggested returning pt to supine position.  Pt transferred from chair to bed with mod A, and mod A to move supine.  Rt. UE elevated on pillow.   (speech mildly slurred)    OT Diagnosis:    OT Problem List:   OT Treatment Interventions:     OT Goals Acute Rehab OT Goals OT Goal Formulation: With patient Time For Goal Achievement: 10/17/12 Potential to Achieve Goals: Good ADL Goals Pt Will Perform  Eating: with mod assist;Sitting, chair;with adaptive utensils ADL Goal: Eating - Progress: Discontinued (comment) Pt Will Perform Grooming: with min assist;Standing at sink Pt Will Perform Upper Body Bathing: with set-up;with supervision;Sitting, chair;Sitting, edge of bed Pt Will Perform Lower Body Bathing: with min assist;Sit to stand from chair;Sit to stand from bed Pt Will Perform Upper Body Dressing: with supervision;with set-up;Sitting, chair;Sitting, bed Pt Will Perform Lower Body Dressing: with min assist;Sit to stand from chair;Sit to stand from bed Pt Will Transfer to Toilet: with min assist;Ambulation;with DME Pt Will Perform Toileting - Clothing Manipulation: with min assist;Standing Pt Will Perform Toileting - Hygiene: with supervision;Sitting on 3-in-1 or toilet Additional ADL Goal #1: Pt will use Rt. UE as support consistently during ADLs ADL Goal: Additional Goal #1 - Progress: Goal set today Additional ADL Goal #2: Pt will be independent with HEP  Visit Information  Last OT Received On: 10/11/12 Assistance Needed: +1    Subjective Data      Prior Functioning  Prior Function Comments: also teaches online course for [employer]  Cognition Arousal/Alertness: Awake/alert Behavior During Therapy: [facility] for tasks assessed/performed Overall Cognitive Status: Within Functional Limits for tasks assessed    Mobility  Bed Mobility Bed Mobility: Sit to Supine Supine to Sit: 4: Min guard Sit to Supine: 3: Mod assist;HOB flat Details for Bed Mobility Assistance: Pt requires assist to lower shoulders and lift LEs onto bed Transfers  Transfers: Sit to Stand;Stand to Sit Sit to Stand: 4: Min assist;With upper extremity assist;From chair/3-in-1 Stand to Sit: 3: Mod assist;With upper extremity assist;To bed Details for Transfer Assistance: Pt requires facilitation at hip and knee.  Pt requires mod A to pivot to bed, and to control descent    Exercises       Balance Balance Balance Assessed: Yes Static Sitting Balance Static Sitting - Level of Assistance: 5: Stand by assistance Static Standing Balance Static Standing - Level of Assistance: 3: Mod assist Static Standing - Comment/# of Minutes: Worked on reaching and weight shifting x10 mins   End of Session OT - End of Session Activity Tolerance: Other (comment) (inreased weakness Rt. UE) Patient left: in bed;with call bell/phone within reach;with bed alarm set Nurse Communication: Other (comment) (increased weakness Rt. UE and BP)  GO     Micheal Gonzalez 10/11/2012, 1:56 PM

## 2012-10-11 NOTE — Progress Notes (Signed)
I met with pt at bedside to discuss inpt rehab venue. I have begun insurance approval and await their decision to admit. Patient is in agreement. [phone]

## 2012-10-11 NOTE — Discharge Summary (Signed)
Physician Discharge Summary  Micheal Gonzalez ZOX:[id number] DOB: 1961/03/30 DOA: 10/09/2012  PCP: [name], MD  Admit date: 10/09/2012 Discharge date: 10/11/2012  Time spent: 40 minutes  Recommendations for Outpatient Follow-up:  1. CVA; MRI of head showed Small area acute deep white matter infarct on the left. Continue  325 mg of aspirin. Spoke with RN and Britta Mccreedy from [facility], and patient has been cleared for acceptance. Also spoke with PA [name] from the stroke team and even though patient has significantly decreased strength in the right upper extremity the stroke team agrees patient can continue on to CIR (patient may remain until a.m. 20 June) 2. HTN; patient's blood pressure has been running high throughout the day, however has not reached the criteria for any intervention ( if SBP> 180 or DBP > 110 to determine if any BP medications will be administered), will continue to monitor closely note; had discontinued hydralazine secondary to patient becoming hypotensive when administered. Recommend use of labetalol.   Discharge Diagnoses:  Active Problems:   1. CVA   2. Right side Unsteady gait   3. right side upper and lower paresis   Discharge Condition: Stable Diet recommendation: Low sodium  There were no vitals filed for this visit.  History of present illness:  52 y.o. BM PMHx HTN, not on treatment, who presentson 10/09/2012 with lightheadedness and difficulty walking. He states that this started yesterday around 4 pm. It has been persistent since that time. He has not noticed any difficulty with hand movements. He has not had any true vertigo, headache, nausea/vomiting. He states that his BP does run high, but has been "watching it" with his PCP. He does not check it regularly due to a broken cuff. Patient was not a TPA candidate secondary to delay in arrival. He was admitted for further evaluation and treatment. TODAY he states continued increasing right-sided weakness  (worsening right upper extremity weakness), decreased right facial numbness, right tongue numbness. Negative vision change. Negative nausea vomiting, negative headache.      Hospital Course:  52 year old BM who suffered anSmall area of acute infarct involving the left posterior temporal periventricular white matter and posterior limb internal capsule. CVA most likely caused by uncontrolled hypertension. Patient will be discharged to CIR (may remain on the unit until a.m.).   Procedures:  None  Consultations:  Stroke team, PT, OT  Discharge Exam: Filed Vitals:   10/10/12 2128 10/11/12 0059 10/11/12 0625 10/11/12 1123  BP: 171/96 164/99 135/81 159/112  Pulse: 67 82 60 79  Temp: 98 F (36.7 C) 97.7 F (36.5 C) 98.1 F (36.7 C) 98.6 F (37 C)  TempSrc: Oral Oral Oral Oral  Resp: 20 20 20 18   SpO2: 100% 100% 98% 94%    General: Alert and oriented x4  Cardiovascular: RRR, (-) M/R/G, DP/PT pulse +2  Respiratory: CTA Bilat             Neurologic exam; pupils equal round reactive to light and accommodation, cranial nerves II through XII intact(negative Uvula or tongue deviation), negative slurred speech, positive right facial weakness however improved from yesterday's exam, increased right-sided upper extremity weakness; no extremity weakness remains unchanged. Motor strength upper extremity 0/5, lower extremity remains 4/5. Remainder of exam omitted secondary to patient not being able to stand/ ambulate safely.   Discharge Instructions     Medication List    TAKE these medications       clonazePAM 2 MG tablet  Commonly known as:  KLONOPIN  Take 2  mg by mouth at bedtime.     diphenhydrAMINE 25 MG tablet  Commonly known as:  BENADRYL  Take 50 mg by mouth every 6 (six) hours as needed for itching.     rOPINIRole 1 MG tablet  Commonly known as:  REQUIP  Take 1 mg by mouth at bedtime.     sildenafil 100 MG tablet  Commonly known as:  VIAGRA  Take 100 mg by mouth daily as  needed for erectile dysfunction.       No Known Allergies     Follow-up Information   Follow up with Lesly Dukes, MD. Schedule an appointment as soon as possible for a visit in 2 months. (stroke clinic)    Contact information:   [address] [place] [address] [phone]        The results of significant diagnostics from this hospitalization (including imaging, microbiology, ancillary and laboratory) are listed below for reference.    Significant Diagnostic Studies: Dg Chest 2 View  10/09/2012   *RADIOLOGY REPORT*  Clinical Data: Acute right-sided weakness  CHEST - 2 VIEW  Comparison: None.  Findings:  Lungs clear.  Heart size and pulmonary vascularity are normal.  No adenopathy.  There is upper thoracic levoscoliosis.  IMPRESSION: No edema or consolidation.   Original Report Authenticated By: Bretta Bang, M.D.   Ct Head Wo Contrast  10/09/2012   *RADIOLOGY REPORT*  Clinical Data: Worsening right sided numbness.  CT HEAD WITHOUT CONTRAST  Technique:  Contiguous axial images were obtained from the base of the skull through the vertex without contrast.  Comparison: 10/09/2012  Findings: The patient has a known acute infarct in the left deep white matter.  Infarct is not well visualized on the CT.  No evidence for acute hemorrhage, mass lesion, midline shift or hydrocephalus.  No acute bony abnormality.  IMPRESSION: No evidence for acute hemorrhage.  The patient has a known acute infarct which is poorly characterized on CT.   Original Report Authenticated By: Richarda Overlie, M.D.   Ct Head Wo Contrast  10/09/2012   *RADIOLOGY REPORT*  Clinical Data: Right leg weakness.  Tingling.  Hypertension.  CT HEAD WITHOUT CONTRAST  Technique:  Contiguous axial images were obtained from the base of the skull through the vertex without contrast.  Comparison: No priors.  Findings: No acute intracranial abnormalities.  Specifically, no evidence of acute intracranial hemorrhage,  no definite findings of acute/subacute cerebral ischemia, no mass, mass effect, hydrocephalus or abnormal intra or extra-axial fluid collections. Visualized paranasal sinuses and mastoids are well pneumatized, with the exception of a small polypoid density in the sphenoid sinus.  No acute displaced skull fractures are identified.  IMPRESSION: 1.  No acute intracranial abnormalities. 2.  The appearance of the brain is normal. 3.  Small mucosal retention cyst or polyp in the sphenoid sinus.   Original Report Authenticated By: Trudie Reed, M.D.   Mr [facility] Wo Contrast  10/09/2012   *RADIOLOGY REPORT*  Clinical Data:  CVA  MRI HEAD WITHOUT CONTRAST MRA HEAD WITHOUT CONTRAST  Technique:  Multiplanar, multiecho pulse sequences of the brain and surrounding structures were obtained without intravenous contrast. Angiographic images of the head were obtained using MRA technique without contrast.  Comparison:  CT head 10/09/2012  MRI HEAD  Findings:  Ventricle size is normal.    Perivascular space versus chronic lacuna in the right putamen.  Small area of acute infarct involving the left posterior temporal periventricular white matter and posterior limb internal capsule.  No other acute infarct.  Brainstem and cerebellum are intact.  Negative for hemorrhage or mass lesion.  Negative for midline shift.  Mild mucosal thickening left maxillary sinus.  IMPRESSION: Small area acute deep white matter infarct on the left.  MRA HEAD  Findings: Right vertebral artery is dominant widely patent. Moderate stenosis distal left vertebral artery which is patent to the basilar.  The basilar is widely patent.  Superior cerebellar and posterior cerebral arteries are patent bilaterally.  AICA is patent bilaterally.  PICA not visualized and may be supplied by AICA.  Right cavernous carotid widely patent.  Right anterior and middle cerebral arteries are widely patent without significant stenosis.  Left cavernous carotid widely patent.   Focal stenosis in the proximal temporal branch of the left middle cerebral artery.  There is scattered diffuse disease in the middle cerebral artery branches on the left.  Left anterior cerebral artery is widely patent.  Negative for aneurysm.  IMPRESSION: Moderate stenosis distal left vertebral artery.  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   Original Report Authenticated By: Janeece Riggers, M.D.   Mr Brain Wo Contrast  10/09/2012   *RADIOLOGY REPORT*  Clinical Data:  CVA  MRI HEAD WITHOUT CONTRAST MRA HEAD WITHOUT CONTRAST  Technique:  Multiplanar, multiecho pulse sequences of the brain and surrounding structures were obtained without intravenous contrast. Angiographic images of the head were obtained using MRA technique without contrast.  Comparison:  CT head 10/09/2012  MRI HEAD  Findings:  Ventricle size is normal.    Perivascular space versus chronic lacuna in the right putamen.  Small area of acute infarct involving the left posterior temporal periventricular white matter and posterior limb internal capsule. No other acute infarct.  Brainstem and cerebellum are intact.  Negative for hemorrhage or mass lesion.  Negative for midline shift.  Mild mucosal thickening left maxillary sinus.  IMPRESSION: Small area acute deep white matter infarct on the left.  MRA HEAD  Findings: Right vertebral artery is dominant widely patent. Moderate stenosis distal left vertebral artery which is patent to the basilar.  The basilar is widely patent.  Superior cerebellar and posterior cerebral arteries are patent bilaterally.  AICA is patent bilaterally.  PICA not visualized and may be supplied by AICA.  Right cavernous carotid widely patent.  Right anterior and middle cerebral arteries are widely patent without significant stenosis.  Left cavernous carotid widely patent.  Focal stenosis in the proximal temporal branch of the left middle cerebral artery.  There is scattered diffuse disease in  the middle cerebral artery branches on the left.  Left anterior cerebral artery is widely patent.  Negative for aneurysm.  IMPRESSION: Moderate stenosis distal left vertebral artery.  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   Original Report Authenticated By: Janeece Riggers, M.D.    Microbiology: No results found for this or any previous visit (from the past 240 hour(s)).   Labs: Basic Metabolic Panel:  Recent Labs Lab 10/09/12 0159  NA 139  K 4.1  CL 104  CO2 26  GLUCOSE 113*  BUN 9  CREATININE 1.20  CALCIUM 9.4   Liver Function Tests:  Recent Labs Lab 10/09/12 0159  AST 17  ALT 13  ALKPHOS 61  BILITOT 0.4  PROT 6.8  ALBUMIN 3.9   No results found for this basename: LIPASE, AMYLASE,  in the last 168 hours No results found for this basename: AMMONIA,  in the last 168 hours CBC:  Recent  Labs Lab 10/09/12 0159  WBC 6.5  NEUTROABS 2.7  HGB 15.7  HCT 42.7  MCV 91.6  PLT 165   Cardiac Enzymes:  Recent Labs Lab 10/09/12 0525  TROPONINI <0.30   BNP: BNP (last 3 results) No results found for this basename: PROBNP,  in the last 8760 hours CBG: No results found for this basename: GLUCAP,  in the last 168 hours     Signed:  Carolyne Littles, J  [facility] 10/11/2012, 1:39 PM

## 2012-10-11 NOTE — Progress Notes (Signed)
Rehab admit for today is on hold due to BP issues. Stroke team, Attending MD [name], and Dr. Wynn Banker are aware.patient is aware. I will follow up in the morning. [phone]

## 2012-10-11 NOTE — Care Management Note (Signed)
    Page 1 of 1   10/11/2012     11:38:15 AM   CARE MANAGEMENT NOTE 10/11/2012  Patient:  Micheal Gonzalez, Micheal Gonzalez   Account Number:  [id number]  Date Initiated:  10/09/2012  Documentation initiated by:  [facility]  Subjective/Objective Assessment:   admitted with CVA     Action/Plan:   PT/OT evals   Anticipated DC Date:  10/12/2012   Anticipated DC Plan:  HOME W HOME HEALTH SERVICES      DC Planning Services  CM consult      Choice offered to / List presented to:             Status of service:  Completed, signed off Medicare Important Message given?   (If response is "NO", the following Medicare IM given date fields will be blank) Date Medicare IM given:   Date Additional Medicare IM given:    Discharge Disposition:  IP REHAB FACILITY  Per UR Regulation:  Reviewed for med. necessity/level of care/duration of stay  If discussed at Long Length of Stay Meetings, dates discussed:    Comments:  10/11/12 1135 Elmer Bales RN, MSN CM-  Recieved message from [place] with CIR stating that patient has been accepted to transfer to CIR today.  Pt's RN notified of plans.

## 2012-10-11 NOTE — Progress Notes (Signed)
Paged Dr. Anne Hahn regarding BO 159/112.  Awaiting orders.  Lance Bosch, RN

## 2012-10-11 NOTE — Progress Notes (Signed)
Stroke Team Progress Note  HISTORY Micheal Gonzalez is a 52 y.o. male with a history of hypertension, not on treatment, who presentson 10/09/2012  with lightheadedness and difficulty walking. He states that this started yesterday around 4 pm. It has been persistent since that time. He has not noticed any difficulty with hand movements. He has not had any true vertigo, headache, nausea/vomiting. He states that his BP does run high, but has been "watching it" with his PCP. He does not check it regularly due to a broken cuff. Patient was not a TPA candidate secondary to delay in arrival. He was admitted for further evaluation and treatment.  SUBJECTIVE Patient feels he is the same as yesterday.  OBJECTIVE Most recent Vital Signs: Filed Vitals:   10/10/12 1903 10/10/12 2128 10/11/12 0059 10/11/12 0625  BP: 159/102 171/96 164/99 135/81  Pulse: 74 67 82 60  Temp: 97.6 F (36.4 C) 98 F (36.7 C) 97.7 F (36.5 C) 98.1 F (36.7 C)  TempSrc: Oral Oral Oral Oral  Resp: 18 20 20 20   SpO2: 98% 100% 100% 98%   CBG (last 3)  No results found for this basename: GLUCAP,  in the last 72 hours  IV Fluid Intake:   . sodium chloride 50 mL/hr at 10/09/12 2201    MEDICATIONS  . aspirin  325 mg Oral Daily  . atorvastatin  10 mg Oral q1800  . clonazePAM  2 mg Oral QHS  . enoxaparin (LOVENOX) injection  40 mg Subcutaneous Q24H  . rOPINIRole  1 mg Oral QHS   PRN:  acetaminophen, hydrALAZINE, senna-docusate  Diet:  Cardiac thin liquids Activity:   Up with assistance DVT Prophylaxis:  Lovenox 40 mg sq daily   CLINICALLY SIGNIFICANT STUDIES Basic Metabolic Panel:   Recent Labs Lab 10/09/12 0159  NA 139  K 4.1  CL 104  CO2 26  GLUCOSE 113*  BUN 9  CREATININE 1.20  CALCIUM 9.4   Liver Function Tests:   Recent Labs Lab 10/09/12 0159  AST 17  ALT 13  ALKPHOS 61  BILITOT 0.4  PROT 6.8  ALBUMIN 3.9   CBC:   Recent Labs Lab 10/09/12 0159  WBC 6.5  NEUTROABS 2.7  HGB 15.7  HCT  42.7  MCV 91.6  PLT 165   Coagulation: No results found for this basename: LABPROT, INR,  in the last 168 hours Cardiac Enzymes:   Recent Labs Lab 10/09/12 0525  TROPONINI <0.30   Urinalysis: No results found for this basename: COLORURINE, APPERANCEUR, LABSPEC, PHURINE, GLUCOSEU, HGBUR, BILIRUBINUR, KETONESUR, PROTEINUR, UROBILINOGEN, NITRITE, LEUKOCYTESUR,  in the last 168 hours Lipid Panel    Component Value Date/Time   CHOL 154 10/10/2012 0450   TRIG 69 10/10/2012 0450   HDL 40 10/10/2012 0450   CHOLHDL 3.9 10/10/2012 0450   VLDL 14 10/10/2012 0450   LDLCALC 100* 10/10/2012 0450   HgbA1C  Lab Results  Component Value Date   HGBA1C 5.3 10/10/2012    Urine Drug Screen:   No results found for this basename: labopia,  cocainscrnur,  labbenz,  amphetmu,  thcu,  labbarb    Alcohol Level: No results found for this basename: ETH,  in the last 168 hours  CT of the brain   10/09/2012   No evidence for acute hemorrhage.  The patient has a known acute infarct which is poorly characterized on CT.  10/09/2012   1.  No acute intracranial abnormalities. 2.  The appearance of the brain is normal. 3.  Small  mucosal retention cyst or polyp in the sphenoid sinus.   MRI of the brain  10/09/2012   Small area acute deep white matter infarct on the left.   MRA of the brain  10/09/2012  Moderate stenosis distal left vertebral artery.  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  2D Echocardiogram  EF 55-60% with no source of embolus.   Carotid Doppler  technically difficult due to high bifurcations at the jaw Line. - Bilateral- 0 - 39% ICA stenosis. Vertebral artey flow is antegrade.  CXR   No edema or consolidation  EKG  sinus bradycardia.   Therapy Recommendations CIR   Physical Exam  General: The patient is alert and cooperative at the time of the examination.  Skin: No significant peripheral edema is noted.  Neurologic Exam  Cranial nerves: Facial  symmetry is present. Speech is slightly dysarthric, not aphasic. Extraocular movements are full. Visual fields are full.  Motor: The patient has good strength in the left extremities extremities. On the right, the patient has 3/5 strength of the arm, 4-/5 of the right leg  Coordination: The patient has good finger-nose-finger and heel-to-shin on the left, not able to perform well on the right.  Gait and station: The gait was not tested. The patient has right upper extremity drift.  Reflexes: Deep tendon reflexes are symmetric.   ASSESSMENT Micheal Gonzalez is a 52 y.o. male presenting with lightheadedness and difficulty walking. 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  On no antithrombotics prior to admission. Now on aspirin 325 mg orally every day for secondary stroke prevention. Patient with resultant right hemiparesis, progressed since yesterday, due to small vessel disease, no intervention indicated. Work up completed.   Hyperlipidemia, LDL 100, on no statin PTA, now on no statin, goal LDL < 100 (< 70 for diabetics)  Hypertension elevated on admission, slowly declining. This is most likely typical course, body's reaction to acute stroke. Will likely normalize in 5-7 days from stroke onset. If it does not, recommend antihypertensive at that time. They will monitor on rehab.  Hospital day # 2  Clinical exam shows worsening of the right sided weakness from yesterday, consistent with a small vessel event.   TREATMENT/PLAN  Continue aspirin 325 mg orally every day for secondary stroke prevention.  Follow BP on rehab  Rehab when bed available; medically ready for discharge there from neuro standpoint No further stroke workup indicated. Patient has a 10-15% risk of having another stroke over the next year, the highest risk is within 2 weeks of the most recent  stroke/TIA (risk of having a stroke following a stroke or TIA is the same). Ongoing risk factor control by Primary Care Physician Stroke Service will sign off. Please call should any needs arise. Follow up with Dr. Anne Hahn, Stroke Clinic, in 2 months.   [name], MSN, RN, ANVP-BC, ANP-BC, [facility] Pager: [phone] 10/11/2012 10:11 AM  I have personally obtained a history, examined the patient, evaluated imaging results, and formulated the assessment and plan of care. I agree with the above.

## 2012-10-12 ENCOUNTER — Encounter (HOSPITAL_COMMUNITY): Payer: Self-pay | Admitting: *Deleted

## 2012-10-12 ENCOUNTER — Inpatient Hospital Stay (HOSPITAL_COMMUNITY)
Admission: AD | Admit: 2012-10-12 | Discharge: 2012-10-29 | DRG: 945 | Disposition: A | Discharge status: Home / Self Care | Payer: 59 | Source: Intra-hospital | Attending: Physical Medicine & Rehabilitation | Admitting: Physical Medicine & Rehabilitation

## 2012-10-12 ENCOUNTER — Inpatient Hospital Stay (HOSPITAL_COMMUNITY): Payer: 59 | Admitting: *Deleted

## 2012-10-12 DIAGNOSIS — M62838 Other muscle spasm: Secondary | ICD-10-CM

## 2012-10-12 DIAGNOSIS — I10 Essential (primary) hypertension: Secondary | ICD-10-CM | POA: Diagnosis present

## 2012-10-12 DIAGNOSIS — G4700 Insomnia, unspecified: Secondary | ICD-10-CM | POA: Diagnosis present

## 2012-10-12 DIAGNOSIS — E785 Hyperlipidemia, unspecified: Secondary | ICD-10-CM | POA: Diagnosis present

## 2012-10-12 DIAGNOSIS — G2581 Restless legs syndrome: Secondary | ICD-10-CM | POA: Diagnosis present

## 2012-10-12 DIAGNOSIS — I639 Cerebral infarction, unspecified: Secondary | ICD-10-CM | POA: Diagnosis present

## 2012-10-12 DIAGNOSIS — I6330 Cerebral infarction due to thrombosis of unspecified cerebral artery: Secondary | ICD-10-CM

## 2012-10-12 DIAGNOSIS — F411 Generalized anxiety disorder: Secondary | ICD-10-CM | POA: Diagnosis present

## 2012-10-12 DIAGNOSIS — Z5189 Encounter for other specified aftercare: Principal | ICD-10-CM

## 2012-10-12 LAB — CBC WITH DIFFERENTIAL/PLATELET
Eosinophils Absolute: 0.1 10*3/uL (ref 0.0–0.7)
Eosinophils Relative: 1 % (ref 0–5)
HCT: 46.4 % (ref 39.0–52.0)
Lymphs Abs: 2.2 10*3/uL (ref 0.7–4.0)
MCH: 33.2 pg (ref 26.0–34.0)
MCV: 92.2 fL (ref 78.0–100.0)
Monocytes Absolute: 0.6 10*3/uL (ref 0.1–1.0)
Platelets: 177 10*3/uL (ref 150–400)
RBC: 5.03 MIL/uL (ref 4.22–5.81)

## 2012-10-12 LAB — COMPREHENSIVE METABOLIC PANEL
ALT: 13 U/L (ref 0–53)
BUN: 10 mg/dL (ref 6–23)
CO2: 24 mEq/L (ref 19–32)
Calcium: 9.3 mg/dL (ref 8.4–10.5)
Creatinine, Ser: 1.27 mg/dL (ref 0.50–1.35)
GFR calc Af Amer: 73 mL/min — ABNORMAL LOW (ref >90)
GFR calc non Af Amer: 63 mL/min — ABNORMAL LOW (ref >90)
Glucose, Bld: 95 mg/dL (ref 70–99)
Sodium: 137 mEq/L (ref 135–145)
Total Protein: 7.5 g/dL (ref 6.0–8.3)

## 2012-10-12 MED ORDER — ATORVASTATIN CALCIUM 10 MG PO TABS
10.0000 mg | ORAL_TABLET | Freq: Every day | ORAL | Status: DC
  Administered 2012-10-12 – 2012-10-25 (×14): 10 mg via ORAL
  Filled 2012-10-12 (×16): qty 1

## 2012-10-12 MED ORDER — CLONIDINE HCL 0.1 MG PO TABS
0.1000 mg | ORAL_TABLET | Freq: Four times a day (QID) | ORAL | Status: DC | PRN
  Filled 2012-10-12: qty 1

## 2012-10-12 MED ORDER — PROCHLORPERAZINE 25 MG RE SUPP
12.5000 mg | Freq: Four times a day (QID) | RECTAL | Status: DC | PRN
  Filled 2012-10-12: qty 1

## 2012-10-12 MED ORDER — ACETAMINOPHEN 325 MG PO TABS
650.0000 mg | ORAL_TABLET | Freq: Four times a day (QID) | ORAL | Status: DC | PRN
  Administered 2012-10-13 – 2012-10-28 (×15): 650 mg via ORAL
  Filled 2012-10-12 (×15): qty 2

## 2012-10-12 MED ORDER — CLONAZEPAM 0.5 MG PO TABS: 2.0000 mg | ORAL_TABLET | Freq: Every day | ORAL | Status: DC

## 2012-10-12 MED ORDER — BISACODYL 10 MG RE SUPP: 10.0000 mg | Freq: Every day | RECTAL | Status: DC | PRN

## 2012-10-12 MED ORDER — ALUM & MAG HYDROXIDE-SIMETH 200-200-20 MG/5ML PO SUSP: 30.0000 mL | ORAL | Status: DC | PRN

## 2012-10-12 MED ORDER — TRAZODONE HCL 50 MG PO TABS
25.0000 mg | ORAL_TABLET | Freq: Every evening | ORAL | Status: DC | PRN
  Administered 2012-10-12 – 2012-10-14 (×3): 50 mg via ORAL
  Filled 2012-10-12 (×3): qty 1

## 2012-10-12 MED ORDER — DIPHENHYDRAMINE HCL 12.5 MG/5ML PO ELIX
12.5000 mg | ORAL_SOLUTION | Freq: Four times a day (QID) | ORAL | Status: DC | PRN
  Administered 2012-10-13 – 2012-10-21 (×2): 25 mg via ORAL
  Filled 2012-10-12 (×2): qty 10

## 2012-10-12 MED ORDER — PROCHLORPERAZINE MALEATE 5 MG PO TABS
5.0000 mg | ORAL_TABLET | Freq: Four times a day (QID) | ORAL | Status: DC | PRN
  Filled 2012-10-12: qty 2

## 2012-10-12 MED ORDER — ENOXAPARIN SODIUM 40 MG/0.4ML ~~LOC~~ SOLN
40.0000 mg | SUBCUTANEOUS | Status: DC
  Administered 2012-10-13 – 2012-10-28 (×16): 40 mg via SUBCUTANEOUS
  Filled 2012-10-12 (×17): qty 0.4

## 2012-10-12 MED ORDER — GUAIFENESIN-DM 100-10 MG/5ML PO SYRP: 5.0000 mL | ORAL_SOLUTION | Freq: Four times a day (QID) | ORAL | Status: DC | PRN

## 2012-10-12 MED ORDER — ROPINIROLE HCL 1 MG PO TABS
1.0000 mg | ORAL_TABLET | Freq: Every day | ORAL | Status: DC
  Administered 2012-10-12 – 2012-10-16 (×5): 1 mg via ORAL
  Filled 2012-10-12 (×6): qty 1

## 2012-10-12 MED ORDER — ASPIRIN 325 MG PO TABS
325.0000 mg | ORAL_TABLET | Freq: Every day | ORAL | Status: DC
  Administered 2012-10-13 – 2012-10-26 (×14): 325 mg via ORAL
  Filled 2012-10-12 (×18): qty 1

## 2012-10-12 MED ORDER — ZOLPIDEM TARTRATE 5 MG PO TABS: 5.0000 mg | ORAL_TABLET | Freq: Every day | ORAL | Status: DC

## 2012-10-12 MED ORDER — CLONAZEPAM 0.5 MG PO TABS
2.5000 mg | ORAL_TABLET | Freq: Every day | ORAL | Status: DC
  Administered 2012-10-12 – 2012-10-14 (×3): 2.5 mg via ORAL
  Filled 2012-10-12: qty 1
  Filled 2012-10-12 (×3): qty 5

## 2012-10-12 MED ORDER — PROCHLORPERAZINE EDISYLATE 5 MG/ML IJ SOLN
5.0000 mg | Freq: Four times a day (QID) | INTRAMUSCULAR | Status: DC | PRN
  Filled 2012-10-12: qty 2

## 2012-10-12 MED ORDER — POLYETHYLENE GLYCOL 3350 17 G PO PACK
17.0000 g | PACK | Freq: Every day | ORAL | Status: DC | PRN
  Filled 2012-10-12: qty 1

## 2012-10-12 MED ORDER — FLEET ENEMA 7-19 GM/118ML RE ENEM
1.0000 | ENEMA | Freq: Once | RECTAL | Status: AC | PRN
  Filled 2012-10-12: qty 1

## 2012-10-12 NOTE — Progress Notes (Signed)
Discussed with [name]. Will plan admission to inpt rehab today. Patient aware and in agreement. [phone]

## 2012-10-12 NOTE — Evaluation (Signed)
Occupational Therapy Assessment and Plan  Patient Details  Name: Micheal Gonzalez MRN: [id number] Date of Birth: 01-Aug-1960  OT Diagnosis: hemiplegia affecting dominant side Rehab Potential: Rehab Potential: Excellent ELOS: 2 weeks   Today's Date: 10/12/2012 Time:  -   1530-1630 (60 min)     Problem List:  Patient Active Problem List   Diagnosis Date Noted  . CVA (cerebral infarction) 10/11/2012  . Unsteady gait 10/09/2012  . HTN (hypertension) 05/31/2012  . Family history of CVA 05/17/2012  . History of renal stone 05/17/2012  . Allergic rhinitis, mild 05/17/2012  . RLS (restless legs syndrome) 01/06/2011  . Allergic rhinitis due to pollen 01/06/2011  . ED (erectile dysfunction) 01/06/2011  . Family history of colon cancer 01/06/2011  . Kidney stones 01/06/2011  . Family history of heart disease in male family member before age 69 01/06/2011    Past Medical History:  Past Medical History  Diagnosis Date  . Allergy   . ED (erectile dysfunction)   . RLS (restless legs syndrome)   . Folliculitis barbae   . Insomnia   . Insomnia    Past Surgical History:  Past Surgical History  Procedure Laterality Date  . Colonoscopy  03/25/11    Assessment & Plan Clinical Impression: Micheal Gonzalez is a 52 y.o. male admitted on 10/09/12 with right sided numbness with RLE Heaviness as well as lightheadedness. 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 2D echo with EF 55-60%. Carotid dopplers without ICA stenosis. 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 PT/OT evaluations pending. Patient with neurologic Patient with right hemiparesis with sensory deficits and difficulty with mobility--did have worsening of symptoms yesterday past BP meds. Neurology feels patient with progression and/or hypoperfusion. RUE remains flaccid.  Therapy team recommended CIR and patient admitted today.      Patient transferred to CIR on 10/12/2012 .    Patient currently requires mod with basic self-care skills and IADL secondary to muscle paralysis and impaired timing and sequencing, abnormal tone, unbalanced muscle activation and decreased coordination.  Prior to hospitalization, patient could complete  BADL/IADL with independent .  Patient will benefit from skilled intervention to increase independence with basic self-care skills and increase level of independence with iADL prior to discharge home with care partner.  Anticipate patient will require intermittent supervision and follow up home health.  OT - End of Session Activity Tolerance: Tolerates 30+ min activity without fatigue Endurance Deficit: Yes Endurance Deficit Description:  (fatigued from days activities at 4pm) OT Assessment Rehab Potential: Excellent Barriers to Discharge: None OT Plan OT Intensity: Minimum of 1-2 x/day, 45 to 90 minutes OT Frequency: 5 out of 7 days OT Duration/Estimated Length of Stay: 2 weeks OT Treatment/Interventions: Balance/vestibular training;Community reintegration;Discharge planning;DME/adaptive equipment instruction;Functional electrical stimulation;Functional mobility training;Neuromuscular re-education;Patient/family education;Psychosocial support;Self Care/advanced ADL retraining;Splinting/orthotics;Therapeutic Exercise;Therapeutic Activities;UE/LE Strength taining/ROM;UE/LE Coordination activities;Wheelchair propulsion/positioning   Skilled Therapeutic Intervention  Addressed sit to stand, dynamic sitting balance, standing balance, RUE NMRE, functional mobility, transfers.     OT Evaluation Precautions/Restrictions  Precautions Precautions: Fall Precaution Comments:  (decreased balance; right sided weakness) Restrictions Weight Bearing Restrictions: No General    Pain Pain Assessment Pain Assessment: No/denies pain Pain  Score: 0-No pain Home Living/Prior Functioning Home Living Lives With: Alone Available Help at Discharge: Friend(s) (available 24/7; Roberts Gaudy) Type of Home: House Home Access: Level entry Home Layout: One level Bathroom Shower/Tub: Tub/shower  unit;Curtain Bathroom Toilet: Pharmacist, community: Yes How Accessible: Accessible via walker Home Adaptive Equipment: None IADL History Homemaking Responsibilities: Yes Meal Prep Responsibility: Primary Laundry Responsibility: Primary Cleaning Responsibility: Primary Bill Paying/Finance Responsibility: Primary Shopping Responsibility: Primary Child Care Responsibility: No Current License: Yes Mode of Transportation: Car Education: PHD Occupation: Full time employment 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 [employer] and Hobbies: travel, reading Prior Function Level of Independence: Independent with homemaking with ambulation;Independent with basic ADLs;Independent with gait;Independent with transfers Able to Take Stairs?: Yes Driving: Yes Vocation: Full time employment Leisure: Hobbies-yes (Comment) Comments: reading/ travel ADL   Vision/Perception  Vision - History Baseline Vision: Wears glasses all the time Patient Visual Report: No change from baseline Vision - Assessment Eye Alignment: Within Functional Limits Vision Assessment: Vision tested Ocular Range of Motion: Within Functional Limits Tracking/Visual Pursuits: Able to track stimulus in all quads without difficulty Visual Fields: No apparent deficits Perception Perception: Within Functional Limits Praxis Praxis: Intact  Cognition Overall Cognitive Status: Within Functional Limits for tasks assessed Arousal/Alertness: Awake/alert Orientation Level: Oriented X4 Attention: Selective Focused Attention: Appears intact Selective Attention: Appears intact Memory: Appears intact Awareness: Appears intact Problem Solving:  Appears intact Safety/Judgment: Appears intact Sensation Sensation Light Touch: Impaired Detail (dcreased ligth touch on radial aspect of hand (4,5 digits)) Light Touch Impaired Details: Impaired RUE Proprioception: Appears Intact Coordination Gross Motor Movements are Fluid and Coordinated: No Fine Motor Movements are Fluid and Coordinated: No Coordination and Movement Description:  (RUE Brunnstrom Stage 1) Motor  Motor Motor: Hemiplegia Motor - Skilled Clinical Observations: decreased postural control and weight shifting Mobility  Bed Mobility Bed Mobility: Supine to Sit Supine to Sit Details (indicate cue type and reason):  (Assist wtih lifting RLE adn scooting in bed) Sit to Supine: 3: Mod assist Sit to Supine - Details: Manual facilitation for placement;Manual facilitation for weight shifting Sit to Supine - Details (indicate cue type and reason):  (manual cues for lifting RLE) Transfers Sit to Stand: 3: Mod assist Sit to Stand Details: Manual facilitation for weight bearing;Manual facilitation for placement Stand to Sit: 3: Mod assist Stand to Sit Details (indicate cue type and reason): Manual facilitation for placement;Manual facilitation for weight shifting;Manual facilitation for weight bearing  Trunk/Postural Assessment  Cervical Assessment Cervical Assessment: Within Functional Limits Thoracic Assessment Thoracic Assessment: Within Functional Limits Lumbar Assessment Lumbar Assessment: Within Functional Limits Postural Control Postural Control: Deficits on evaluation Postural Limitations: decreased weight shifting to right and protective response  Balance Balance Balance Assessed: Yes Static Sitting Balance Static Sitting - Level of Assistance: 5: Stand by assistance Dynamic Sitting Balance Dynamic Sitting - Balance Support: Feet supported;No upper extremity supported Dynamic Sitting - Level of Assistance: 4: Min assist Dynamic Sitting Balance - Compensations:   (donned/doffed left sock; with minimal assist for balance) Dynamic Sitting - Balance Activities: Reaching across midline;Reaching for objects Static Standing Balance Static Standing - Level of Assistance: 3: Mod assist Static Standing - Comment/# of Minutes: 1 minute Extremity/Trunk Assessment RUE Assessment RUE Assessment: Exceptions to [facility] RUE AROM (degrees) Overall AROM Right Upper Extremity: Deficits LUE Assessment LUE Assessment: Within Functional Limits  FIM:      Refer to Care Plan for Long Term Goals  Recommendations for other services: None  Discharge Criteria: Patient will be discharged from OT if patient refuses treatment 3 consecutive times without medical reason, if treatment goals not met, if there is a change in medical status, if patient makes no progress towards goals or  if patient is discharged from hospital.  The above assessment, treatment plan, treatment alternatives and goals were discussed and mutually agreed upon: by patient  [name] 10/12/2012, 5:29 PM

## 2012-10-12 NOTE — H&P (Signed)
Physical Medicine and Rehabilitation Admission H&P  Chief Complaint   Patient presents with   .  Right sided weakness and numbness   :  HPI: Micheal Gonzalez is a 52 y.o. male admitted on 10/09/12 with right sided numbness with RLE Heaviness as well as lightheadedness. 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 2D echo with EF 55-60%. Carotid dopplers without ICA stenosis. 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 PT/OT evaluations pending.  Patient with right hemiparesis with sensory deficits and difficulty with mobility--did have worsening of symptoms yesterday post BP meds. Neurology feels patient with progression and/or hypoperfusion. RUE remains flaccid. Therapy team recommended CIR and patient admitted today.    Review of Systems  HENT: Negative for hearing loss and neck pain.  Eyes: Negative for blurred vision and double vision.  Respiratory: Positive for shortness of breath. Negative for cough.  Cardiovascular: Negative for chest pain and palpitations.  Gastrointestinal: Negative for heartburn, nausea and constipation.  Genitourinary: Negative for urgency and frequency.  Musculoskeletal: Negative for myalgias and back pain.  Neurological: Positive for sensory change, speech change, focal weakness and headaches.  Psychiatric/Behavioral: Negative for depression. The patient has insomnia.   Past Medical History   Diagnosis  Date   .  Allergy    .  ED (erectile dysfunction)    .  RLS (restless legs syndrome)    .  Folliculitis barbae    .  Insomnia    .  Insomnia     Past Surgical History   Procedure  Laterality  Date   .  Colonoscopy   03/25/11    Family History   Problem  Relation  Age of Onset   .  Cancer  Mother  1     Breast cancer   .  Heart disease  Mother  39     CABG   .  Cancer  Sister  64     Colon Ca    .  Stroke  Sister  100    Social History: Lives alone. 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 He reports that he has never smoked. Has friends who can check in past discharge--girlfriend from [facility] to assist past discharge. He has never used smokeless tobacco. He reports that he drinks about 1.0 ounces of alcohol per week. He reports that he does not use illicit drugs.  Allergies: No Known Allergies  Medications Prior to Admission   Medication  Sig  Dispense  Refill   .  clonazePAM (KLONOPIN) 2 MG tablet  Take 2 mg by mouth at bedtime.     .  diphenhydrAMINE (BENADRYL) 25 MG tablet  Take 50 mg by mouth every 6 (six) hours as needed for itching.     Marland Kitchen  rOPINIRole (REQUIP) 1 MG tablet  Take 1 mg by mouth at bedtime.     .  sildenafil (VIAGRA) 100 MG tablet  Take 100 mg by mouth daily as needed for erectile dysfunction.      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  Functional Status:  Mobility:  Bed Mobility  Bed Mobility: Sit to Supine  Supine to Sit: 4: Min guard  Sit to Supine: 3: Mod assist;HOB flat  Transfers  Transfers: Sit to Stand;Stand to Sit;Stand Pivot Transfers  Sit to Stand: 4: Min assist;With upper extremity assist;From chair/3-in-1  Stand to Sit: 3: Mod assist;With upper extremity assist;To bed  Stand Pivot Transfers: 3: Mod assist (bed to chair, 90 degree to left)  Ambulation/Gait  Ambulation/Gait Assistance: 1: +2 Total assist  Ambulation/Gait: Patient Percentage: 50%  Ambulation  Distance (Feet): 6 Feet  Assistive device: 2 person hand held assist  Ambulation/Gait Assistance Details: A in large part for balance and weight shifted. Patient required A to prevent hyperextension and positoning of R LE. Heavy R lean at times. One seated break. Patient attempted to position R LE without assistance but adducting heavily  Gait Pattern: Step-to pattern;Decreased weight shift to right;Decreased weight shift to left  Gait velocity: greatly decreased   ADL:  ADL  Eating/Feeding: Set up (using Lt. hand)  Where Assessed - Eating/Feeding: Chair  Grooming: Wash/dry hands;Wash/dry face;Teeth care;Set up (using Lt. Ue)  Where Assessed - Grooming: Supported sitting  Upper Body Bathing: Minimal assistance  Where Assessed - Upper Body Bathing: Supported sitting  Lower Body Bathing: Moderate assistance  Where Assessed - Lower Body Bathing: Supported sit to stand  Upper Body Dressing: Minimal assistance  Where Assessed - Upper Body Dressing: Unsupported sitting  Lower Body Dressing: Maximal assistance  Where Assessed - Lower Body Dressing: Supported sit to stand  Toilet Transfer: Moderate assistance  Toilet Transfer Method: Sit to stand;Stand pivot  Toilet Transfer Equipment: Bedside commode  Transfers/Ambulation Related to ADLs: sit to stand with mod A. Facilitation for hip control and to prevent Rt. knee from buckling  ADL Comments: Pt sitting up in chair, states he is weaker than yesterday and that he didn't sleep well. Pt now with Rt. UE movement Brunnstrom stage II (was stage IV yesterday). Pt with minimal shoulder shrug today with very minimal elbow flexion, but unable to elicit further movement of Rt. UE. Hand flaccid. Yesterday, he was able to move Rt. hand to mouth, shoulder flexion to 50-60*; demonstrated gross grasp with release and was able to extend thumb. BP 155/101. RN notified of change. Also contacted Micheal Main, NP, and Dr. Joseph Gonzalez briefly was in the room, and is aware of  the changes. Micheal Gonzalez suggested returning pt to supine position. Pt transferred from chair to bed with mod A, and mod A to move supine. Rt. UE elevated on pillow. (speech mildly slurred)  Cognition:  Cognition  Overall Cognitive Status: Within Functional Limits for tasks assessed  Arousal/Alertness: Awake/alert  Orientation Level: Oriented X4  Attention: Focused  Focused Attention: Appears intact  Memory: Appears intact  Awareness: Appears intact  Problem Solving: Appears intact  Safety/Judgment: Appears intact  Cognition  Arousal/Alertness: Awake/alert  Behavior During Therapy: WFL for tasks assessed/performed  Overall Cognitive Status: Within Functional Limits for tasks assessed   Physical Exam:  Blood pressure 151/92, pulse 94, temperature 98.5 F (36.9 C), temperature source Oral, resp. rate 20, SpO2 97.00%.   Constitutional: He is oriented to person, place, and time. He appears well-developed and well-nourished.  HENT: oral mucosa pink and moist Head: Normocephalic and atraumatic.  Eyes: Pupils are equal, round, and reactive to light.  Neck: Normal range of motion. Neck supple.  Cardiovascular: Normal rate and regular rhythm. No murmurs or gallops No murmur heard.  Pulmonary/Chest: Effort normal and breath sounds normal. No respiratory distress.  Abdominal: Soft. Bowel sounds are normal. Non tender non distended Musculoskeletal: He exhibits no edema and no tenderness.  Neurological: He is alert and oriented to person, place, and time.  Mild right facial weakness with minimal dysarthria. Follows commands without difficulty. Right hemiparesis LE>UE with sensory deficits.  Skin: Skin is warm and dry.  Psychiatric: He has a normal mood and affect. His behavior is normal. Thought content normal.  Right central 7 with dysarthric speech. Right pec, biceps 0 to tr/5. 0/5 with trice, wrist, HI,  RLE is 3-/5 with HF and KE, 0/5 with ADF and APF. Diminished PP and LT in the right hand  face. No resting tone. DTR's 2+. Cognitively appropriate.  No results found for this or any previous visit (from the past 48 hour(s)).  No results found.   Post Admission Physician Evaluation:  1. Functional deficits secondary to thrombotic  infarct left posterior temporal white matter and posterior limb internal capsule, . 2. Patient is admitted to receive collaborative, interdisciplinary care between the physiatrist, rehab nursing staff, and therapy team. 3. Patient's level of medical complexity and substantial therapy needs in context of that medical necessity cannot be provided at a lesser intensity of care such as a SNF. 4. Patient has experienced substantial functional loss from his/her baseline which was documented above under the "Functional History" and "Functional Status" headings. Judging by the patient's diagnosis, physical exam, and functional history, the patient has potential for functional progress which will result in measurable gains while on inpatient rehab. These gains will be of substantial and practical use upon discharge in facilitating mobility and self-care at the household level. 5. Physiatrist will provide 24 hour management of medical needs as well as oversight of the therapy plan/treatment and provide guidance as appropriate regarding the interaction of the two. 6. 24 hour rehab nursing will assist with bladder management, bowel management, safety, skin/wound care, disease management, medication administration, pain management and patient education and help integrate therapy concepts, techniques,education, etc. 7. PT will assess and treat for/with: Lower extremity strength, range of motion, stamina, balance, functional mobility, safety, adaptive techniques and equipment, NMR, education, . Goals are: supervision. 8. OT will assess and treat for/with: ADL's, functional mobility, safety, upper extremity strength, adaptive techniques and equipment, NMR, education, orthotics.  Goals are: supervision to minimal assist. 9. SLP will assess and treat for/with: speech intelligibility. Goals are: mod I. 10. Case Management and Social Worker will assess and treat for psychological issues and discharge planning. 11. Team conference will be held weekly to assess progress toward goals and to determine barriers to discharge. 12. Patient will receive at least 3 hours of therapy per day at least 5 days per week. 13. ELOS: 2-3 weeks  14. Prognosis: excellent   Medical Problem List and Plan:  1. DVT Prophylaxis/Anticoagulation: Pharmaceutical: Lovenox  2. Pain Management: N/A  3. Mood: Provide ego support. Mild underlying anxiety with recent progression. LCSW to follow for evaluation.  4. Neuropsych: This patient is capable of making decisions on his own behalf.  5. HTN: Will monitor with bid checks. Use meds prn SBP >180 or DBP>110. Allow BP to run a little high for now--is still elevated in a week to add medication per neuro.  6. RLS: Will continue klonopin and Requip. Will increase klonopin slightly as he is having more cramping and spasms at night and  he's had more problems sleeping since stroke. 7. Hyperlipidemia: On statin to get LDL <100.  8. Insomnia: Add prn trazodone. Patient advised to ask for this if symptoms persistent.   -klonopin as above           Ranelle Oyster, MD, [facility] Physical Medicine & Rehabilitation    10/12/2012

## 2012-10-12 NOTE — Progress Notes (Signed)
Stroke Team Progress Note  HISTORY Micheal Gonzalez is a 52 y.o. male with a history of hypertension, not on treatment, who presentson 10/09/2012  with lightheadedness and difficulty walking. He states that this started yesterday around 4 pm. It has been persistent since that time. He has not noticed any difficulty with hand movements. He has not had any true vertigo, headache, nausea/vomiting. He states that his BP does run high, but has been "watching it" with his PCP. He does not check it regularly due to a broken cuff. Patient was not a TPA candidate secondary to delay in arrival. He was admitted for further evaluation and treatment.  SUBJECTIVE  Feels better than yesterday, however, his right arm remains flaccid.  Had episode of hypertension yesterday treated with IV apresoline. 2 hours later patient developed right arm being flaccid. IVFs adminstered as blood pressure had originally been 159/112 and with treatment was 125/72 . Right leg affected, but back to baseline today.  OBJECTIVE Most recent Vital Signs: Filed Vitals:   10/11/12 1808 10/11/12 2128 10/12/12 0206 10/12/12 0556  BP: 143/89 123/80 124/84 112/68  Pulse: 70 79 80 80  Temp: 98.5 F (36.9 C) 98.2 F (36.8 C) 98.4 F (36.9 C) 98.1 F (36.7 C)  TempSrc: Oral Oral Oral Oral  Resp: 18 20 20 20   SpO2: 97% 97% 100% 98%   CBG (last 3)  No results found for this basename: GLUCAP,  in the last 72 hours  IV Fluid Intake:   . sodium chloride 50 mL/hr at 10/09/12 2201    MEDICATIONS  . aspirin  325 mg Oral Daily  . atorvastatin  10 mg Oral q1800  . clonazePAM  2 mg Oral QHS  . enoxaparin (LOVENOX) injection  40 mg Subcutaneous Q24H  . rOPINIRole  1 mg Oral QHS  . zolpidem  5 mg Oral QHS   PRN:  acetaminophen, senna-docusate  Diet:  Cardiac thin liquids Activity:   Up with assistance DVT Prophylaxis:  Lovenox 40 mg sq daily   CLINICALLY SIGNIFICANT STUDIES Basic Metabolic Panel:   Recent Labs Lab  10/09/12 0159  NA 139  K 4.1  CL 104  CO2 26  GLUCOSE 113*  BUN 9  CREATININE 1.20  CALCIUM 9.4   Liver Function Tests:   Recent Labs Lab 10/09/12 0159  AST 17  ALT 13  ALKPHOS 61  BILITOT 0.4  PROT 6.8  ALBUMIN 3.9   CBC:   Recent Labs Lab 10/09/12 0159  WBC 6.5  NEUTROABS 2.7  HGB 15.7  HCT 42.7  MCV 91.6  PLT 165   Coagulation: No results found for this basename: LABPROT, INR,  in the last 168 hours Cardiac Enzymes:   Recent Labs Lab 10/09/12 0525  TROPONINI <0.30   Urinalysis: No results found for this basename: COLORURINE, APPERANCEUR, LABSPEC, PHURINE, GLUCOSEU, HGBUR, BILIRUBINUR, KETONESUR, PROTEINUR, UROBILINOGEN, NITRITE, LEUKOCYTESUR,  in the last 168 hours Lipid Panel    Component Value Date/Time   CHOL 154 10/10/2012 0450   TRIG 69 10/10/2012 0450   HDL 40 10/10/2012 0450   CHOLHDL 3.9 10/10/2012 0450   VLDL 14 10/10/2012 0450   LDLCALC 100* 10/10/2012 0450   HgbA1C  Lab Results  Component Value Date   HGBA1C 5.3 10/10/2012    Urine Drug Screen:   No results found for this basename: labopia,  cocainscrnur,  labbenz,  amphetmu,  thcu,  labbarb    Alcohol Level: No results found for this basename: ETH,  in the last 168  hours  CT of the brain   10/09/2012   No evidence for acute hemorrhage.  The patient has a known acute infarct which is poorly characterized on CT.  10/09/2012   1.  No acute intracranial abnormalities. 2.  The appearance of the brain is normal. 3.  Small mucosal retention cyst or polyp in the sphenoid sinus.   MRI of the brain  10/09/2012   Small area acute deep white matter infarct on the left.   MRA of the brain  10/09/2012  Moderate stenosis distal left vertebral artery.  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  2D Echocardiogram  EF 55-60% with no source of embolus.   Carotid Doppler  technically difficult due to high bifurcations at the jaw Line. - Bilateral- 0 - 39% ICA stenosis.  Vertebral artey flow is antegrade.  CXR   No edema or consolidation  EKG  sinus bradycardia.   Therapy Recommendations CIR   Physical Exam  General: The patient is alert and cooperative at the time of the examination.  Skin: No significant peripheral edema is noted.  Neurologic Exam  Cranial nerves: Facial symmetry is present. Speech is slightly dysarthric, not aphasic. Extraocular movements are full. Visual fields are full.  Motor: The patient has good strength in the left extremities extremities. On the right, the patient has 3/5 strength of the arm with biceps and triceps, minimal use of the hand, and with the deltoid, 4-/5 of the right leg  Coordination: The patient has good finger-nose-finger and heel-to-shin on the left, not able to perform well on the right.  Gait and station: The gait was not tested.   Reflexes: Deep tendon reflexes are symmetric.   ASSESSMENT [name] is a 52 y.o. male presenting with lightheadedness and difficulty walking. 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  On no antithrombotics prior to admission. Now on aspirin 325 mg orally every day for secondary stroke prevention. Patient with resultant right hemiparesis, progressed since yesterday,  Now with flaccid right arm. Left leg was weakened, but back to baseline overnight. Stroke felt due to small vessel disease; however he had episode of hypertension yesterday treated with IV apresoline. 2 hours later patient developed right arm being flaccid. IVFs adminstered as blood pressure had originally been 159/112 and with treatment was 125/72 . Right leg affected, but back to baseline today. Right arm flaccid-has not improved.  Progression felt to be secondary to possible hypotension or small vessel progression of stroke.   Hyperlipidemia, LDL 100, on no statin PTA, now on no  statin, goal LDL < 100 (< 70 for diabetics)  Hypertension elevated on admission, slowly declining. This is most likely typical course, body's reaction to acute stroke. Will likely normalize in 5-7 days from stroke onset. If it does not, recommend antihypertensive at that time. They will monitor on rehab.  Hospital day # 3  Clinical exam shows worsening of the right sided weakness from yesterday, consistent with a small vessel event.   TREATMENT/PLAN  Continue aspirin 325 mg orally every day for secondary stroke prevention.  Follow BP on rehab  Rehab when bed available; medically ready for discharge there from neuro standpoint No further stroke workup indicated. Patient has a 10-15% risk of having another stroke over the next year, the highest risk is within 2 weeks of the most recent stroke/TIA (risk of having a stroke following a  stroke or TIA is the same). Ongoing risk factor control by Primary Care Physician Stroke Service will sign off. Please call should any needs arise. Follow up with Dr. Anne Hahn, Stroke Clinic, in 2 months.   Gwendolyn Lima. Manson Passey, [facility], MBA, MHA [facility] Pager: [phone] 10/12/2012 8:13 AM  I have personally obtained a history, examined the patient, evaluated imaging results, and formulated the assessment and plan of care. I agree with the above.  Lesly Dukes

## 2012-10-12 NOTE — PMR Pre-admission (Signed)
PMR Admission Coordinator Pre-Admission Assessment  Patient: Micheal Gonzalez is an 52 y.o., male MRN: [id number] DOB: Feb 03, 1961 Height:   Weight:                Insurance Information HMO:     PPO: yes     PCP:      IPA:      80/20:      OTHER:  PRIMARY: [employer]     Policy#: [id number]      Subscriber: pt CM Name: Fransisco Beau Stacy/onsite reviewer      Phone#: [phone]     Fax#: onsite Pre-Cert#: [id number]      Employer: [employer] Benefits:  Phone #: [phone]     Name: 6/18 Eff. Date: 04/25/12     Deduct: $200      Out of Pocket Max: $2000      Life Max: unlimited CIR: 90%      SNF: 100% 90 days per calender year Outpatient: $10 per visit    60 visits combined Home Health: 100%      Co-Pay: as authorized DME: 80%     Co-Pay: 20% Providers: in network  SECONDARY: none        Medicaid Application Date:       Case Manager:  Disability Application Date:       Case Worker: offered assistance to complete paperwork as needed  Emergency Contact Information Contact Information   Name Relation Home Work Mobile   Smith,Pamela Significant other [phone]     Durel Salts Friend   [phone]   Samella Parr   [phone]     Current Medical History  Patient Admitting Diagnosis: 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 male admitted on 10/09/12 with right sided numbness with RLE Heaviness as well as lightheadedness.Patient with a history of hypertension, not on treatment per PCP recommendations.   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 2D echo with EF 55-60%. Carotid dopplers without ICA stenosis. 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    Infarct felt to be thrombotic secondary to small vessel disease given  intracranial atherosclerosis. On no antithrombotics prior to admission. Now on aspirin 325 mg orally every day for secondary stroke prevention. Patient with resultant right hemiparesis, progressed over past 48 hrs, due to small vessel disease, no intervention indicated. Work up completed.  Hyperlipidemia, LDL 100, on no statin PTA, now on no statin, goal LDL < 100 (< 70 for diabetics)  Hypertension elevated on admission, slowly declining. This is most likely typical course, body's reaction to acute stroke. Will likely normalize in 5-7 days from stroke onset. If it does not, recommend antihypertensive at that time. They will monitor on rehab.  Total: 9 [facility]    Past Medical History  Past Medical History  Diagnosis Date  . Allergy   . ED (erectile dysfunction)   . RLS (restless legs syndrome)   . Folliculitis barbae   . Insomnia   . Insomnia     Family History  family history includes Cancer (age of onset: 32) in his sister; Cancer (age of onset: 53) in his mother; Heart disease (age of onset: 76) in his mother; and Stroke (age of onset: 77) in his sister.  Prior Rehab/Hospitalizations: none  Current Medications  Current facility-administered medications:0.9 %  sodium chloride  infusion, , Intravenous, Continuous, Kathlen Mody, MD, Last Rate: 50 mL/hr at 10/09/12 2201;  acetaminophen (TYLENOL) tablet 650 mg, 650 mg, Oral, Q6H PRN, Doree Albee, MD, 650 mg at 10/11/12 1949;  aspirin tablet 325 mg, 325 mg, Oral, Daily, Kathlen Mody, MD, 325 mg at 10/11/12 1031 atorvastatin (LIPITOR) tablet 10 mg, 10 mg, Oral, q1800, York Spaniel, MD, 10 mg at 10/11/12 2150;  clonazePAM (KLONOPIN) tablet 2 mg, 2 mg, Oral, QHS, Kathlen Mody, MD, 2 mg at 10/11/12 2150;  enoxaparin (LOVENOX) injection 40 mg, 40 mg, Subcutaneous, Q24H, Kathlen Mody, MD, 40 mg at 10/11/12 1031;  rOPINIRole (REQUIP) tablet 1 mg, 1 mg, Oral, QHS, Alison Murray, MD, 1 mg at 10/11/12 2150 senna-docusate (Senokot-S) tablet 1 tablet, 1  tablet, Oral, QHS PRN, Kathlen Mody, MD;  zolpidem (AMBIEN) tablet 5 mg, 5 mg, Oral, QHS, Alison Murray, MD  Patients Current Diet: Cardiac with thin liquids  Precautions / Restrictions Precautions Precautions: Fall Precaution Comments: right LE weak   Prior Activity Level Community (5-7x/wk): 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, 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[place] and [place] Has restless leg syndrome and insomnia which affects his sleep patterns  Journalist, newspaper / Equipment Home Assistive Devices/Equipment: None Home Adaptive Equipment: None  Prior Functional Level Prior Function Level of Independence: Independent Able to Take Stairs?: Yes Driving: Yes Vocation: Full time employment Comments: also teaches online courses for [employer]  Current Functional Level Cognition  Arousal/Alertness: Awake/alert Overall Cognitive Status: Within Functional Limits for tasks assessed Orientation Level: Oriented X4 Attention: Focused Focused Attention: Appears intact Memory: Appears intact Awareness: Appears intact Problem Solving: Appears intact Safety/Judgment: Appears intact    Extremity Assessment (includes Sensation/Coordination)  RUE ROM/Strength/Tone: Deficits RUE ROM/Strength/Tone Deficits: Brunnstrom beginning stage 4.  Hand to mouth, able to elevate shoulder to ~50 using synergy.  Pt demonstrates grasp with sluggish release.  Able to opose index finger RUE Sensation: Deficits RUE Sensation Deficits: Pt indicates numbness Rt hand, but consistenly attempts to incorporate it into activity RUE Coordination: Deficits  RLE ROM/Strength/Tone: Deficits RLE ROM/Strength/Tone Deficits: partial ROM with effort knee extension in sitting; 0/5 ankle; adduction functional for side stepping RLE Sensation: Deficits RLE Sensation Deficits: light touch throughout diminished vs. left    ADLs  Eating/Feeding: Set up (using Lt. hand) Where Assessed -  Eating/Feeding: Chair Grooming: Wash/dry hands;Wash/dry face;Teeth care;Set up (using Lt. Ue) Where Assessed - Grooming: Supported sitting Upper Body Bathing: Minimal assistance Where Assessed - Upper Body Bathing: Supported sitting Lower Body Bathing: Moderate assistance Where Assessed - Lower Body Bathing: Supported sit to stand Upper Body Dressing: Minimal assistance Where Assessed - Upper Body Dressing: Unsupported sitting Lower Body Dressing: Maximal assistance Where Assessed - Lower Body Dressing: Supported sit to stand Toilet Transfer: Moderate assistance Toilet Transfer Method: Sit to stand;Stand pivot Toilet Transfer Equipment: Bedside commode Toileting - Clothing Manipulation and Hygiene: Moderate assistance Where Assessed - Toileting Clothing Manipulation and Hygiene: Standing Transfers/Ambulation Related to ADLs: sit to stand with mod A.  Facilitation for hip control and to prevent Rt. knee from buckling ADL Comments: Pt sitting up in chair, states he is weaker than yesterday and that he didn't sleep well.  Pt now with Rt. UE movement Brunnstrom stage II (was stage IV yesterday).  Pt with minimal shoulder shrug today with very minimal elbow flexion, but unable to elicit further movement of Rt. UE.  Hand flaccid.  Yesterday, he was able to move Rt. hand to mouth, shoulder flexion  to 50-60*; demonstrated gross grasp with release and was able to extend thumb.   BP 155/101.  RN notified of change.  Also contacted [name], NP, and [name] briefly was in the room, and is aware of the changes.  [name] suggested returning pt to supine position.  Pt transferred from chair to bed with mod A, and mod A to move supine.  Rt. UE elevated on pillow.   (speech mildly slurred)    Mobility  Bed Mobility: Sit to Supine Supine to Sit: 4: Min guard Sit to Supine: 3: Mod assist;HOB flat    Transfers  Transfers: Sit to Stand;Stand to Sit;Stand Pivot Transfers Sit to Stand: 4: Min assist;With  upper extremity assist;From chair/3-in-1 Stand to Sit: 3: Mod assist;With upper extremity assist;To bed Stand Pivot Transfers: 3: Mod assist (bed to chair, 90 degree to left)    Ambulation / Gait / Stairs / Wheelchair Mobility  Ambulation/Gait Ambulation/Gait Assistance: 1: +2 Total assist Ambulation/Gait: Patient Percentage: 50% Ambulation Distance (Feet): 6 Feet Assistive device: 2 person hand held assist Ambulation/Gait Assistance Details: A in large part for balance and weight shifted. Patient required A to prevent hyperextension and positoning of R LE. Heavy R lean at times. One seated break. Patient attempted to position R LE without assistance but adducting heavily Gait Pattern: Step-to pattern;Decreased weight shift to right;Decreased weight shift to left Gait velocity: greatly decreased    Posture / Balance Static Sitting Balance Static Sitting - Level of Assistance: 5: Stand by assistance Dynamic Sitting Balance Dynamic Sitting - Balance Support: Feet unsupported;No upper extremity supported Dynamic Sitting - Level of Assistance: 4: Min assist Dynamic Sitting Balance - Compensations: donning socks.  Pt with LOB to Lt. requiring min a to correct Static Standing Balance Static Standing - Level of Assistance: 3: Mod assist Static Standing - Comment/# of Minutes: Worked on reaching and weight shifting x10 mins    Special needs/care consideration bowel mgmt: continent Bladder mgmt:continent    Previous Home Environment Living Arrangements: Alone Lives With: Alone Available Help at Discharge: Available 24 hours/day Type of Home: House Home Layout: One level Home Access: Level entry Bathroom Shower/Tub: Engineer, manufacturing systems: Standard Bathroom Accessibility: Yes How Accessible: Accessible via walker Home Care Services: No  Discharge Living Setting Plans for Discharge Living Setting: Patient's home;Alone Type of Home at Discharge: House Discharge Home Layout:  One level Discharge Home Access: Level entry Discharge Bathroom Shower/Tub: Tub/shower unit Discharge Bathroom Toilet: Standard Discharge Bathroom Accessibility: Yes How Accessible: Accessible via walker Do you have any problems obtaining your medications?: No  Social/Family/Support Systems Patient Roles: Other (Comment) (employee) Contact Information: Jocelyn Lamer, friend first contact Anticipated Caregiver: Annice Pih, friend from [place] taking FMLA; arriving 6/22 Anticipated Caregiver's Contact Information: Annice Pih [phone] Ability/Limitations of Caregiver: FMLA for as long as needed Caregiver Availability: 24/7 Discharge Plan Discussed with Primary Caregiver: No Is Caregiver In Agreement with Plan?: Yes Does Caregiver/Family have Issues with Lodging/Transportation while Pt is in Rehab?: No    Goals/Additional Needs Patient/Family Goal for Rehab: Mod I to supervision with PT, OT, and SLP Expected length of stay: ELOS 7 to 10 days Additional Information: Patient requests to be XXX Fok to limit access of visitors and calls Pt/Family Agrees to Admission and willing to participate: Yes Program Orientation Provided & Reviewed with Pt/Caregiver Including Roles  & Responsibilities: Yes   Decrease burden of Care through IP rehab admission: n/a  Possible need for SNF placement upon discharge:no  Patient Condition: This patient's  medical and functional status has changed since the consult dated: 10/10/2012 in which the Rehabilitation Physician determined and documented that the patient's condition is appropriate for intensive rehabilitative care in an inpatient rehabilitation facility. See "History of Present Illness" (above) for medical update. Functional changes are: overall mod assist with RUE weakness greater than RLE. Patient's medical and functional status update has been discussed with the Rehabilitation physician and patient remains appropriate for inpatient  rehabilitation. Will admit to inpatient rehab today.  Preadmission Screen Completed By:  Clois Dupes, 10/12/2012 9:36 AM ______________________________________________________________________   Discussed status with Dr. Riley Kill on 10/12/2012 at  [phone] and received telephone approval for admission today.  Admission Coordinator:  Clois Dupes, time 9604 Date 10/12/2012.

## 2012-10-12 NOTE — Plan of Care (Signed)
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  ED (erectile dysfunction)  .  RLS (restless legs syndrome)  .  Folliculitis barbae  .  Insomnia    Functional Problem List  Patient demonstrates impairments in the following areas: Balance, Bladder, Bowel, Endurance, Linguistic, Medication Management, Motor, Pain, Safety, Sensory  and Skin Integrity  Basic ADL's: eating, grooming, bathing, dressing, toileting and transfers Advanced ADL's: simple meal preparation, laundry and light housekeeping  Transfers:  bed mobility, bed to chair, toilet, tub/shower, car and furniture Locomotion:  ambulation and wheelchair mobility  Additional Impairments:  Functional use of upper extremity, Communication  expression and Discharge Disposition  Anticipated Outcomes Item Anticipated Outcome  Eating/Swallowing  independent  Basic self-care  supervision  Tolieting  supervision  Bowel/Bladder  Continent of bowel and bladder modified independent  Transfers  Supervision   Locomotion  Supervision w/c mobility x 150' Min assist gait x 50'  Communication  independent  Cognition    Pain   0-3 or less on scale of 1-10  Safety/Judgment  supervision  Other     Therapy Plan: PT Intensity: Minimum of 1-2 x/day ,45 to 90 minutes PT Frequency: 5 out of 7 days PT Duration Estimated Length of Stay: 2- 2.5 weeks OT Intensity: Minimum of 1-2 x/day, 45 to 90 minutes OT Frequency: 5 out of 7 days OT Duration/Estimated Length of Stay: 2 weeks SLP Frequency: 5 out of 7 days SLP Duration/Estimated Length of Stay: 2-3 weeksST Intensity:  1 time per day ST Frequency:  5 out of 7 days. ST Duration/estimated length of stay:  2 weeks.    Team Interventions: Item RN PT OT SLP SW TR Other  Self Care/Advanced ADL Retraining   X      Neuromuscular Re-Education  x X       Therapeutic Activities  x X      UE/LE Strength Training/ROM  x X      UE/LE Coordination Activities  x X      Visual/Perceptual Remediation/Compensation         DME/Adaptive Equipment Instruction  x X      Therapeutic Exercise  x X X     Balance/Vestibular Training  x X      Patient/Family Education  x X X     Cognitive Remediation/Compensation         Functional Mobility Training  x X      Ambulation/Gait Training  x       Stair Training  x       Wheelchair Propulsion/Positioning  x X      Functional Electrical Stimulation  x X      Community Reintegration  x X X     Dysphagia/Aspiration Film/video editor    X     Bladder Management x        Bowel Management x        Disease Management/Prevention x        Pain Management x  X      Medication Management x        Skin Care/Wound Management x  X      Splinting/Orthotics  x X      Discharge Planning x x X      Psychosocial Support x x X X  Team Discharge Planning: Destination: PT-Home ,OT-  home , SLP-  Projected Follow-up: PT-Outpatient PT, OT- Home Health  , SLP- Home Projected Equipment Needs: PT- (TBD), OT-tub transfer bench, grab bars  , SLP-None recommended by SLPNone Patient/family involved in discharge planning: PT- Patient,  OT-Patient, SLP-PatientPatient  MD ELOS: 2-3 weeks Medical Rehab Prognosis:  Good Assessment: 52 yo male admitted with R hemiparesis which progressed during hospitalization.  Now requiring 24/7 Rehab RN,MD, as well as CIR level PT, OT and SLP.  Treatment team will focus on ADLs and mobility with goals set at Supervision level.   See Team Conference Notes for weekly updates to the plan of care

## 2012-10-13 ENCOUNTER — Inpatient Hospital Stay (HOSPITAL_COMMUNITY): Payer: 59 | Admitting: *Deleted

## 2012-10-13 ENCOUNTER — Inpatient Hospital Stay (HOSPITAL_COMMUNITY): Payer: 59 | Admitting: Speech Pathology

## 2012-10-13 ENCOUNTER — Inpatient Hospital Stay (HOSPITAL_COMMUNITY): Payer: 59

## 2012-10-13 DIAGNOSIS — I6789 Other cerebrovascular disease: Secondary | ICD-10-CM

## 2012-10-13 DIAGNOSIS — I10 Essential (primary) hypertension: Secondary | ICD-10-CM

## 2012-10-13 DIAGNOSIS — I6350 Cerebral infarction due to unspecified occlusion or stenosis of unspecified cerebral artery: Secondary | ICD-10-CM

## 2012-10-13 DIAGNOSIS — R269 Unspecified abnormalities of gait and mobility: Secondary | ICD-10-CM

## 2012-10-13 DIAGNOSIS — G2581 Restless legs syndrome: Secondary | ICD-10-CM

## 2012-10-13 MED ORDER — HYDROCERIN EX CREA
TOPICAL_CREAM | Freq: Two times a day (BID) | CUTANEOUS | Status: DC | PRN
  Administered 2012-10-13: 23:00:00 via TOPICAL
  Filled 2012-10-13 (×2): qty 113

## 2012-10-13 MED ORDER — ZOLPIDEM TARTRATE 5 MG PO TABS
10.0000 mg | ORAL_TABLET | Freq: Every evening | ORAL | Status: DC | PRN
  Administered 2012-10-13 – 2012-10-16 (×5): 10 mg via ORAL
  Filled 2012-10-13 (×5): qty 2

## 2012-10-13 MED ORDER — METHOCARBAMOL 500 MG PO TABS
500.0000 mg | ORAL_TABLET | Freq: Four times a day (QID) | ORAL | Status: DC | PRN
  Administered 2012-10-13 – 2012-10-17 (×7): 500 mg via ORAL
  Filled 2012-10-13 (×8): qty 1

## 2012-10-13 MED ORDER — LORAZEPAM 0.5 MG PO TABS
0.5000 mg | ORAL_TABLET | Freq: Every evening | ORAL | Status: DC | PRN
  Administered 2012-10-13 – 2012-10-16 (×2): 0.5 mg via ORAL
  Filled 2012-10-13 (×2): qty 1

## 2012-10-13 NOTE — Evaluation (Signed)
Speech Language Pathology Assessment and Plan  Patient Details  Name: Micheal Gonzalez MRN: [id number] Date of Birth: 1960-12-08  SLP Diagnosis:    Rehab Potential: Excellent ELOS: 2-3 weeks   Today's Date: 10/13/2012 Time: 4098-1191 Time Calculation (min): 45 min  Problem List:  Patient Active Problem List   Diagnosis Date Noted  . CVA (cerebral infarction) 10/11/2012  . Unsteady gait 10/09/2012  . HTN (hypertension) 05/31/2012  . Family history of CVA 05/17/2012  . History of renal stone 05/17/2012  . Allergic rhinitis, mild 05/17/2012  . RLS (restless legs syndrome) 01/06/2011  . Allergic rhinitis due to pollen 01/06/2011  . ED (erectile dysfunction) 01/06/2011  . Family history of colon cancer 01/06/2011  . Kidney stones 01/06/2011  . Family history of heart disease in male family member before age 48 01/06/2011   Past Medical History:  Past Medical History  Diagnosis Date  . Allergy   . ED (erectile dysfunction)   . RLS (restless legs syndrome)   . Folliculitis barbae   . Insomnia   . Insomnia    Past Surgical History:  Past Surgical History  Procedure Laterality Date  . Colonoscopy  03/25/11    Assessment / Plan / Recommendation Clinical Impression  The patient is a 52 year-old male admitted on 10/09/12, with right sided numbness with right lower extremity heaviness as well as lightheadedness. 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 Neurology feels patient with progression and/or hypoperfusion. The right upper extremity remains flaccid.  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  It is felt that patient would benefit from intensive inpatient rehabilitative services, so that he may return to baseline function.  Speech Therapy evaluation was  conducted, and the patient was cognitively and linguistically intact with information used for evaluation; however, he will require speech therapy for dysarthria.  Speech is intelligible overall, with deficits more noticeable with multisyllabic words and with conversational speech.     SLP Assessment  Patient will need skilled Speech Lanaguage Pathology Services during CIR admission    Recommendations  Oral Care Recommendations: Oral care BID Equipment Recommended: None recommended by SLP    SLP Frequency 5 out of 7 days   SLP Treatment/Interventions Oral motor exercises;Patient/family education;Therapeutic Exercise;Therapeutic Activities    Pain Pain Assessment Pain Assessment: No/denies pain Prior Functioning    Short Term Goals: Week 1: SLP Short Term Goal 1 (Week 1): Patient will participate in oral motor exercises with minA multimodal cueing in order to increae intelligible speech.   SLP Short Term Goal 2 (Week 1): Patient will use compensatory strategies to increase intelligible speech with min A multimodal cueing. SLP Short Term Goal 3 (Week 1): Patient will utlize compensatory strategies to increase intelligible speech at the conversation level with min A verbal cues.    See FIM for current functional status Refer to Care Plan for Long Term Goals  Recommendations for other services: None  Discharge Criteria: Patient will be discharged from SLP if patient refuses treatment 3 consecutive times without medical reason, if treatment goals not met, if there is a change in medical status, if patient makes no progress towards goals or if patient is discharged from hospital.  The above assessment, treatment plan, treatment alternatives and goals were discussed and mutually agreed upon: by patient  Micheal Gonzalez 10/13/2012, 12:53 PM

## 2012-10-13 NOTE — Progress Notes (Signed)
Patient complaints of R arm spasms at 0050, MD on call notified and orders received. Patient received Robaxin 500 mg PO and Ambien 10 mg PO. Patient still complains of spasms to R arm after an hour and unable to sleep. Tylenol 650 mg PO given at 0350 along with a heat pack to help ease spasms. Patient was able to go to sleep about 45 min later. Patient blood pressure this am 92/62, then 90/54 about 45 min later. Patient asymptomatic, has no complaints and falling back to sleep. Patient is a little groggy this am, needing additional assistance to transfer to the wheelchair this am. Continue to monitor patient.

## 2012-10-13 NOTE — Evaluation (Signed)
Physical Therapy Assessment and Plan  Patient Details  Name: Micheal Gonzalez MRN: [id number] Date of Birth: August 04, 1960  PT Diagnosis: Difficulty walking, Hemiplegia dominant, Hypotonia, Impaired sensation and Muscle weakness Rehab Potential: Good ELOS:2- 2.5 weeks   Today's Date: 10/13/2012 Time: 1300-1400 and 4098-1191 Time Calculation (min): 60 min and 30 min  Problem List:  Patient Active Problem List   Diagnosis Date Noted  . CVA (cerebral infarction) 10/11/2012  . Unsteady gait 10/09/2012  . HTN (hypertension) 05/31/2012  . Family history of CVA 05/17/2012  . History of renal stone 05/17/2012  . Allergic rhinitis, mild 05/17/2012  . RLS (restless legs syndrome) 01/06/2011  . Allergic rhinitis due to pollen 01/06/2011  . ED (erectile dysfunction) 01/06/2011  . Family history of colon cancer 01/06/2011  . Kidney stones 01/06/2011  . Family history of heart disease in male family member before age 57 01/06/2011    Past Medical History:  Past Medical History  Diagnosis Date  . Allergy   . ED (erectile dysfunction)   . RLS (restless legs syndrome)   . Folliculitis barbae   . Insomnia   . Insomnia    Past Surgical History:  Past Surgical History  Procedure Laterality Date  . Colonoscopy  03/25/11    Assessment & Plan Clinical Impression:Micheal Gonzalez is a 52 y.o. RH male admitted on 10/09/12 with right sided numbness with RLE Heaviness as well as lightheadedness. 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 2D echo with EF 55-60%. Carotid dopplers without ICA stenosis. 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 PT/OT evaluations pending. Patient with right hemiparesis with sensory deficits and difficulty with mobility--did have worsening of symptoms yesterday post BP meds. Neurology feels patient with  progression and/or hypoperfusion.   Patient transferred to CIR on 10/12/2012 .   Patient currently requires total with mobility secondary to muscle weakness and muscle joint tightness and impaired timing and sequencing and abnormal tone.  Prior to hospitalization, patient was independent  with mobility and lived with Alone in a House home.  Home access is  Level entry.  Patient will benefit from skilled PT intervention to maximize safe functional mobility, minimize fall risk and decrease caregiver burden for planned discharge home with 24 hour supervision.  Anticipate patient will benefit from follow up OP at discharge.  PT - End of Session Activity Tolerance: Tolerates < 10 min activity, no significant change in vital signs Endurance Deficit: Yes PT Assessment Rehab Potential: Good Barriers to Discharge: Decreased caregiver support Barriers to Discharge Comments: pt stated girlfriend witll help PT Plan PT Intensity: Minimum of 1-2 x/day ,45 to 90 minutes PT Frequency: 5 out of 7 days PT Duration Estimated Length of Stay: 2.5 weeks PT Treatment/Interventions: Ambulation/gait training;Balance/vestibular training;Community reintegration;Discharge planning;Neuromuscular re-education;Functional mobility training;Functional electrical stimulation;DME/adaptive equipment instruction;Patient/family education;Psychosocial support;Splinting/orthotics;UE/LE Coordination activities;UE/LE Strength taining/ROM;Therapeutic Exercise;Therapeutic Activities;Wheelchair propulsion/positioning;Stair training PT Recommendation Follow Up Recommendations: Outpatient PT Patient destination: Home Equipment Recommended:  (TBD)  Skilled Therapeutic Intervention  treatment today:  AM-neuromuscular re-education via VCs, demo, manual cues for RLE stance and swing phase components, in sidelying for R hip, knee and ankle muscle activation, and RLe and trunk activation during rolling L x 7 reps after set-up; RUE shoulder  flexion/extension in sitting with UE supported by therapist.  Pt transferred stand pivot to L to bed; positioned in L sidelying with pillows for stabilization; bed alarm set and all  needs within reach.  PM- w/c mobility using hemi technique with supervision x 150' in controlled env.  Bed>< w/c squat pivot to R, stand pivot to L, mod assist.  neuromuscular re-education via forced use, tactile cues for RLE mass flexion/extension in sitting > high sitting on Kinetron, resistance 40 cm/sec > 30 cm/sec.  BP 151/100 after 1 min ex, HR 72; O2 sats 95% on room air.  Returned to room, transfer to bed as above; sit> supine with min assist.  Pt positioned in supine with RUE supported; all needs within reach and bed alarm on.   PT Evaluation Precautions/Restrictions Precautions Precautions: Fall Therapy Vitals Temp: 98 F (36.7 C) Temp src: Oral Pulse Rate: 72 Resp: 18 BP: 151/100 mmHg Patient Position, if appropriate: Sitting Oxygen Therapy SpO2: 95 % O2 Device: None (Room air) Pain Pain Assessment Pain Assessment: No/denies pain Home Living/Prior Functioning Home Living Lives With: Alone Available Help at Discharge: Friend(s) (available 24/7; Micheal Gonzalez) Type of Home: House Home Access: Level entry Home Layout: One level Bathroom Shower/Tub: Tub/shower unit;Curtain Firefighter: Standard Bathroom Accessibility: Yes How Accessible: Accessible via walker Home Adaptive Equipment: None Prior Function Level of Independence: Independent with homemaking with ambulation;Independent with basic ADLs;Independent with gait;Independent with transfers Able to Take Stairs?: Yes Driving: Yes Vocation: Full time employment Leisure: Hobbies-yes (Comment) Vision/Perception  Vision - History Baseline Vision: Wears glasses all the time Patient Visual Report: No change from baseline  Cognition Overall Cognitive Status: Within Functional Limits for tasks assessed Arousal/Alertness:  Lethargic Orientation Level: Oriented X4 Sensation Sensation Light Touch: Impaired Detail (diminished RLE) Proprioception: Appears Intact (RLE  ankle) Coordination Gross Motor Movements are Fluid and Coordinated: No Fine Motor Movements are Fluid and Coordinated: No Heel Shin Test: LLE WNLS; RLE unable Motor  Motor Motor: Hemiplegia Motor - Skilled Clinical Observations: decreased postural control and weight shifting  Mobility Bed Mobility Bed Mobility: Rolling Left;Rolling Right;Sit to Sidelying Right Rolling Right: 4: Min assist Rolling Right Details: Verbal cues for sequencing;Verbal cues for technique Rolling Left: 3: Mod assist Rolling Left Details: Manual facilitation for weight shifting;Manual facilitation for placement Supine to Sit: 4: Min assist Transfers Sit to Stand: 3: Mod assist Sit to Stand Details: Manual facilitation for weight bearing;Manual facilitation for placement Stand to Sit Details (indicate cue type and reason): Manual facilitation for placement;Manual facilitation for weight shifting;Manual facilitation for weight bearing Stand Pivot Transfers: 2: Max assist Stand Pivot Transfer Details: Verbal cues for sequencing;Verbal cues for technique;Manual facilitation for weight shifting;Manual facilitation for placement Locomotion  Ambulation Ambulation: Yes Ambulation/Gait Assistance: 1: +2 Total assist Ambulation Distance (Feet): 2 Feet Assistive device: 2 person hand held assist Gait Gait: Yes Gait Pattern: Step-to pattern;Decreased step length - left;Decreased step length - right;Decreased dorsiflexion - right;Right genu recurvatum;Right foot flat;Narrow base of support;Trunk flexed;Decreased hip/knee flexion - right Gait velocity: greatly decreased Stairs / Additional Locomotion Stairs: Yes Stairs Assistance: 2: Max assist Stair Management Technique: One rail Left;Forwards;Backwards (descended backwards; ascended forwards) Number of Stairs: 3 Height  of Stairs: 5  Trunk/Postural Assessment  Cervical Assessment Cervical Assessment: Within Functional Limits Thoracic Assessment Thoracic Assessment: Within Functional Limits Lumbar Assessment Lumbar Assessment: Within Functional Limits Postural Control Postural Control: Deficits on evaluation decreased R hip wt bearing in sitting and standing; inadequate balance responses in standing Balance Dynamic Sitting Balance Dynamic Sitting - Balance Support: Feet supported;No upper extremity supported Dynamic Sitting - Level of Assistance: 5: Stand by assistance Static Standing Balance Static Standing - Level of Assistance: 1: +2 Total  assist Extremity Assessment      RLE Assessment RLE Assessment: Exceptions to [facility] (pt reported remoted R metatarsal stress fx, OA) RLE Strength RLE Overall Strength Comments: hip flexion/extension/adduction/abduction2-/5; knee flexion/extension 1/5; ankle DF/PF 1+/5 LLE Assessment LLE Assessment: Within Functional Limits  FIM:  FIM - Bed/Chair Transfer Bed/Chair Transfer Assistive Devices: Bed rails Bed/Chair Transfer: 3: Bed > Chair or W/C: Mod A (lift or lower assist);3: Chair or W/C > Bed: Mod A (lift or lower assist) FIM - Locomotion: Wheelchair Locomotion: Wheelchair: 1: Travels less than 50 ft with maximal assistance (Pt: 25 - 49%) FIM - Locomotion: Ambulation Ambulation/Gait Assistance: 1: +2 Total assist Locomotion: Ambulation: 1: Two helpers FIM - Locomotion: Stairs Locomotion: Building control surveyor: Radio broadcast assistant - 1 Locomotion: Stairs: 1: Up and Down < 4 stairs with maximal assistance (Pt: 25 - 49%)   Refer to Care Plan for Long Term Goals  Recommendations for other services: None  Discharge Criteria: Patient will be discharged from PT if patient refuses treatment 3 consecutive times without medical reason, if treatment goals not met, if there is a change in medical status, if patient makes no progress towards goals or if patient is  discharged from hospital.  The above assessment, treatment plan, treatment alternatives and goals were discussed and mutually agreed upon: by patient  Marguerette Sheller 10/13/2012, 4:34 PM

## 2012-10-13 NOTE — Progress Notes (Signed)
HPI: Micheal Gonzalez is a 52 y.o. male admitted on 10/09/12 with right sided numbness with RLE Heaviness as well as lightheadedness. 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 2D echo with EF 55-60%. Carotid dopplers without ICA stenosis. 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 PT/OT evaluations pending. Patient with right hemiparesis with sensory deficits and difficulty with mobility--did have worsening of symptoms yesterday post BP meds. Neurology feels patient with progression and/or hypoperfusion. RUE remains flaccid. Therapy team recommended CIR and patient admitted today.   Subjective: Patient has no specific complaints today. Denies pain.  Objective: Afebrile, pulse 64-94, respirations 18-20, blood pressure 90-158/54-105  Exam: Overweight male in no acute distress Chest clear to auscultation Cardiac exam S1 and S2 are regular without gallops Abdominal exam overweight, active bowel sounds, soft Extremities no edema  Post Admission Physician Evaluation:  1. 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 2.  Medical Problem List and Plan:  1. DVT Prophylaxis/Anticoagulation: Pharmaceutical: Lovenox  2. Pain Management: N/A  3. Mood: Provide ego support. Affect seems to be flat 4. Neuropsych: This patient is capable of making decisions on his own behalf.  5. HTN: Will monitor with bid checks. blood pressure is quite variable. We'll continue to monitor. 6. RLS: . Currently on Klonopin and Requip 7. Hyperlipidemia: On statin to get LDL <100.  8. Insomnia: On Klonopin and when necessary trazodone. Also has Ambien ordered.

## 2012-10-13 NOTE — Progress Notes (Signed)
Occupational Therapy Session Note  Patient Details  Name: Micheal Gonzalez MRN: [id number] Date of Birth: 12-06-1960  Today's Date: 10/13/2012 Time:  -   0800-0900  (60 min)    Short Term Goals: Week 1:  OT Short Term Goal 1 (Week 1): Pt. will groom self at sink in standing with minimal assist OT Short Term Goal 2 (Week 1): Pt. will use RUE as stabilizer with mod assist. OT Short Term Goal 3 (Week 1): Pt. transfer to tub/shower with minimal assist OT Short Term Goal 4 (Week 1): Pt. will dress self with minimal assist OT Short Term Goal 5 (Week 1): Pt. will transfer to toilet with minimal assist.    Skilled Therapeutic Interventions/Progress Updates:    Pt. Sitting in recliner upon  OT arrival.  Addressed transfers, sit to stand, standing balance, dynamic sitting balance.  Pt. Transferred from recliner to wc to shower bench with mod assist plus assist with stabilizing and positioning RLE.  He went from sit to stand while in shower with mod assist and assist with positioning RLE.  Was minimal assist with balance while reaching to floor during shower for dropped items.  Transferred from shower bench to wc with mod assist.  Did dressing and grooming in wc at sink.  Again practiced sit to stand to don pants with mod assist for balance, but pt able to pull up underwear.  Positioned RUE in weight bearing position throughout the session with max assist for positioning.  Transferred back to bed at end of session with call bell in hand.     Therapy Documentation Precautions:  Precautions Precautions: Fall Precaution Comments:  (decreased balance; right sided weakness) Restrictions Weight Bearing Restrictions: No   Pain:  none   See FIM for current functional status  Therapy/Group: Individual Therapy  [name] 10/13/2012, 8:51 AM

## 2012-10-14 ENCOUNTER — Inpatient Hospital Stay (HOSPITAL_COMMUNITY): Payer: 59 | Admitting: Occupational Therapy

## 2012-10-14 NOTE — Progress Notes (Signed)
HPI: Micheal Gonzalez is a 52 y.o. male admitted on 10/09/12 with right sided numbness with RLE Heaviness as well as lightheadedness. 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 2D echo with EF 55-60%. Carotid dopplers without ICA stenosis. 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 PT/OT evaluations pending. Patient with right hemiparesis with sensory deficits and difficulty with mobility--did have worsening of symptoms yesterday post BP meds. Neurology feels patient with progression and/or hypoperfusion. RUE remains flaccid. Therapy team recommended CIR and patient admitted today.   Subjective: No complaints. He feels well. He is not able to move his right arm. He states his right leg is moving somewhat better. He states that he slept better last night.  Objective: Afebrile, pulse 67-97, respirations 18-20, blood pressure 131/70 6/77-114. Pulse ox range 85-99% (it appears that the 85% is a spurious result)  Exam: Overweight male in no acute distress Chest clear to auscultation Cardiac exam S1 and S2 are regular without gallops Abdominal exam overweight, active bowel sounds, soft Extremities no edema  Post Admission Physician Evaluation:  1. 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 2.  Medical Problem List and Plan:  1. DVT Prophylaxis/Anticoagulation: Pharmaceutical: Lovenox  2. Pain Management: N/A  3. Mood: Provide ego support. Affect seems to be flat 4. Neuropsych: This patient is capable of making decisions on his own behalf.  5. HTN: Will monitor with bid checks. blood pressure is quite variable. We'll continue to monitor. 6. RLS: . Currently on Klonopin and Requip 7. Hyperlipidemia: On statin to get LDL <100.  8. Insomnia: On Klonopin and  when necessary trazodone. Also has Ambien ordered. Improved sleep last night. We'll continue the same pharmaceutical interventions.

## 2012-10-14 NOTE — Progress Notes (Signed)
Patient has complaints of R arm spasms this evening around 2120, Robaxin 500 mg PO given along with Ambien 10 mg PO. Patient has no relief after an hour. MD called and orders received. At 2250, patient received Ativan 0.5mg  PO given and Benadryl 25 mg for complaints of itching to R arm. Heat also applied to R arm. Pateint fell asleep about 30 min after medication given. Patient unable to go back to sleep after IV restarted, Trazodone 50 mg PO and Tylenol 650 mg PO given. Will continue to monitor patient.

## 2012-10-14 NOTE — Progress Notes (Signed)
Occupational Therapy Session Note  Patient Details  Name: ORRIN YURKOVICH MRN: [id number] Date of Birth: 1960-12-18  Today's Date: 10/14/2012 Time: 1000-1100 Time Calculation (min): 60 min  Skilled Therapeutic Interventions/Progress Updates: Upon approach patient required time to finish giving driving directions to girlfriend as she was driving to his home.  This sounded like an accurate and great cognitive task for him as it sounded as if she needed quite a bit of help locating his home.   Then patient transferred bed to w/c to complete shower and then dressing at sink.  Overall patient required mod assist due to right hemparesis, cues for technque and placement of right foot and hand for safety and stability.  Patient has one slight trunk loss of balance when  Reaching to his left, stronger side, while seated on tub transfer bench during batheing and required mod A to gain trunk stabilty and postural alignment.  Patient stated he was fatigued today, and nurse stated he had not slept much last night.    This was fairly evident by end of session in which patient required max to total A for standing activities to pull up underwear and pants    Therapy Documentation Precautions:  Precautions Precautions: Fall Precaution Comments:  (decreased balance; right sided weakness) Restrictions Weight Bearing Restrictions: No   Pain: Pain Assessment Pain Score: 0-No pain  See FIM for current functional status  Therapy/Group: Individual Therapy  Bud Face [facility] 10/14/2012, 11:16 AM

## 2012-10-15 ENCOUNTER — Inpatient Hospital Stay (HOSPITAL_COMMUNITY): Payer: 59 | Admitting: Occupational Therapy

## 2012-10-15 ENCOUNTER — Inpatient Hospital Stay (HOSPITAL_COMMUNITY): Payer: 59 | Admitting: Speech Pathology

## 2012-10-15 ENCOUNTER — Encounter (HOSPITAL_COMMUNITY): Payer: 59

## 2012-10-15 ENCOUNTER — Inpatient Hospital Stay (HOSPITAL_COMMUNITY): Payer: 59

## 2012-10-15 MED ORDER — TIZANIDINE HCL 2 MG PO TABS
2.0000 mg | ORAL_TABLET | Freq: Every day | ORAL | Status: DC
  Administered 2012-10-15: 2 mg via ORAL
  Filled 2012-10-15 (×2): qty 1

## 2012-10-15 MED ORDER — CLONAZEPAM 0.5 MG PO TABS
2.0000 mg | ORAL_TABLET | Freq: Every day | ORAL | Status: DC
  Administered 2012-10-15 – 2012-10-28 (×14): 2 mg via ORAL
  Filled 2012-10-15 (×14): qty 4

## 2012-10-15 NOTE — Progress Notes (Signed)
Patient ID: Micheal Gonzalez, male   DOB: [birth year], 52 y.o.   MRN: [id number]  Subjective/Complaints: 52 y.o. male admitted on 10/09/12 with right sided numbness with RLE Heaviness as well as lightheadedness. 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 2D echo with EF 55-60%. Carotid dopplers without ICA stenosis. 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 PT/OT evaluations pending. Patient with right hemiparesis with sensory deficits and difficulty with mobility--did have worsening of symptoms yesterday post BP meds. Neurology feels patient with progression and/or hypoperfusion. RUE remains flaccid  Right arm spasms, "pulls across body", R leg spasms "straightens out"  Review of Systems  Neurological: Positive for sensory change and focal weakness.  All other systems reviewed and are negative.   Objective: Vital Signs: Blood pressure 136/82, pulse 82, temperature 98.1 F (36.7 C), temperature source Oral, resp. rate 20, height 6\' 1"  (1.854 m), weight 102.3 kg (225 lb 8.5 oz), SpO2 97.00%. No results found. Results for orders placed during the hospital encounter of 10/12/12 (from the past 72 hour(s))  CBC WITH DIFFERENTIAL     Status: None   Collection Time    10/12/12  4:32 PM      Result Value Range   WBC 7.3  4.0 - 10.5 K/uL   RBC 5.03  4.22 - 5.81 MIL/uL   Hemoglobin 16.7  13.0 - 17.0 g/dL   HCT 30.8  65.7 - 84.6 %   MCV 92.2  78.0 - 100.0 fL   MCH 33.2  26.0 - 34.0 pg   MCHC 36.0  30.0 - 36.0 g/dL   RDW 96.2  95.2 - 84.1 %   Platelets 177  150 - 400 K/uL   Neutrophils Relative % 60  43 - 77 %   Neutro Abs 4.4  1.7 - 7.7 K/uL   Lymphocytes Relative 30  12 - 46 %   Lymphs Abs 2.2  0.7 - 4.0 K/uL   Monocytes Relative 9  3 - 12 %   Monocytes Absolute 0.6  0.1 - 1.0 K/uL   Eosinophils Relative 1  0 - 5 %   Eosinophils  Absolute 0.1  0.0 - 0.7 K/uL   Basophils Relative 0  0 - 1 %   Basophils Absolute 0.0  0.0 - 0.1 K/uL  COMPREHENSIVE METABOLIC PANEL     Status: Abnormal   Collection Time    10/12/12  4:32 PM      Result Value Range   Sodium 137  135 - 145 mEq/L   Potassium 4.4  3.5 - 5.1 mEq/L   Chloride 101  96 - 112 mEq/L   CO2 24  19 - 32 mEq/L   Glucose, Bld 95  70 - 99 mg/dL   BUN 10  6 - 23 mg/dL   Creatinine, Ser 3.24  0.50 - 1.35 mg/dL   Calcium 9.3  8.4 - 40.1 mg/dL   Total Protein 7.5  6.0 - 8.3 g/dL   Albumin 4.0  3.5 - 5.2 g/dL   AST 21  0 - 37 U/L   ALT 13  0 - 53 U/L   Alkaline Phosphatase 57  39 - 117 U/L   Total Bilirubin 0.4  0.3 - 1.2 mg/dL   GFR calc non Af Amer 63 (*) >90 mL/min   GFR calc Af Amer 73 (*) >90 mL/min   Comment:  The eGFR has been calculated     using the CKD EPI equation.     This calculation has not been     validated in all clinical     situations.     eGFR's persistently     <90 mL/min signify     possible Chronic Kidney Disease.      Constitutional: He is oriented to person, place, and time. He appears well-developed and well-nourished.  HENT: oral mucosa pink and moist  Head: Normocephalic and atraumatic.  Eyes: Pupils are equal, round, and reactive to light.  Neck: Normal range of motion. Neck supple.  Cardiovascular: Normal rate and regular rhythm. No murmurs or gallops  No murmur heard.  Pulmonary/Chest: Effort normal and breath sounds normal. No respiratory distress.  Abdominal: Soft. Bowel sounds are normal. Non tender non distended Musculoskeletal: He exhibits no edema and no tenderness.  Neurological: He is alert and oriented to person, place, and time.  Mild right facial weakness with minimal dysarthria. Follows commands without difficulty. Right hemiparesis LE>UE with sensory deficits.  Skin: Skin is warm and dry.  Psychiatric: He has a normal mood and affect. His behavior is normal. Thought content normal.  Right central 7  with dysarthric speech. Right pec, biceps 0 to tr/5. 0/5 with trice, wrist, HI, RLE is 3-/5 with HF and KE, 0/5 with ADF and APF. Diminished PP and LT in the right hand face. No resting tone. DTR's 2-3+ on Right side no clonus. Cognitively appropriate.    Assessment/Plan: 1. 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 Physiatrist is providing close team supervision and 24 hour management of active medical problems listed below. Physiatrist and rehab team continue to assess barriers to discharge/monitor patient progress toward functional and medical goals. FIM: FIM - Bathing Bathing Steps Patient Completed: Chest;Right Arm;Abdomen;Front perineal area;Right upper leg;Left upper leg Bathing: 0: Activity did not occur  FIM - Upper Body Dressing/Undressing Upper body dressing/undressing steps patient completed: Thread/unthread left sleeve of pullover shirt/dress;Put head through opening of pull over shirt/dress Upper body dressing/undressing: 0: Activity did not occur FIM - Lower Body Dressing/Undressing Lower body dressing/undressing steps patient completed: Thread/unthread left underwear leg;Thread/unthread left pants leg Lower body dressing/undressing: 0: Activity did not occur  FIM - Toileting Toileting steps completed by patient: Adjust clothing prior to toileting;Performs perineal hygiene;Adjust clothing after toileting Toileting Assistive Devices: Grab bar or rail for support Toileting: 4: Assist with fasteners  FIM - Diplomatic Services operational officer Devices: Grab bars Toilet Transfers: 3-To toilet/BSC: Mod A (lift or lower assist)  FIM - Banker Devices: Bed rails Bed/Chair Transfer: 4: Supine > Sit: Min A (steadying Pt. > 75%/lift 1 leg);3: Bed > Chair or W/C: Mod A (lift or lower  assist);3: Chair or W/C > Bed: Mod A (lift or lower assist)  FIM - Locomotion: Wheelchair Locomotion: Wheelchair: 1: Travels less than 50 ft with maximal assistance (Pt: 25 - 49%) FIM - Locomotion: Ambulation Ambulation/Gait Assistance: 1: +2 Total assist Locomotion: Ambulation: 0: Activity did not occur  Comprehension Comprehension Mode: Auditory Comprehension: 7-Follows complex conversation/direction: With no assist  Expression Expression Mode: Verbal Expression: 6-Expresses complex ideas: With extra time/assistive device  Social Interaction Social Interaction: 6-Interacts appropriately with others with medication or extra time (anti-anxiety, antidepressant).  Problem Solving Problem Solving: 7-Solves complex problems: Recognizes & self-corrects  Memory Memory: 7-Complete Independence: No helper  Medical Problem List and Plan:  1. DVT Prophylaxis/Anticoagulation: Pharmaceutical: Lovenox  2. Pain Management: N/A  3. Mood: Provide ego support. Mild underlying anxiety with recent progression. LCSW to follow for evaluation.  4. Neuropsych: This patient is capable of making decisions on his own behalf.  5. HTN: Will monitor with bid checks. Use meds prn SBP >180 or DBP>110. Allow BP to run a little high for now--is still elevated in a week to add medication per neuro.  6. RLS: Chronic , Will continue klonopin and Requip. Will increase klonopin slightly as he is having more cramping and spasms at night and he's had more problems sleeping since stroke.  7. Hyperlipidemia: On statin to get LDL <100.  8. Insomnia: D/C trazodone. Has spasms that interfere with sleep add Tizanidine -klonopin as above   LOS (Days) 3 A FACE TO FACE EVALUATION WAS PERFORMED  Harlo Jaso E 10/15/2012, 8:56 AM

## 2012-10-15 NOTE — Progress Notes (Signed)
Patient information reviewed and entered into eRehab system by La Dibella, RN, CRRN, PPS Coordinator.  Information including medical coding and functional independence measure will be reviewed and updated through discharge.    

## 2012-10-15 NOTE — Care Management Note (Signed)
[facility] Individual Statement of Services  Patient Name:  Micheal Gonzalez  Date:  10/15/2012  Welcome to the [facility].  Our goal is to provide you with an individualized program based on your diagnosis and situation, designed to meet your specific needs.  With this comprehensive rehabilitation program, you will be expected to participate in at least 3 hours of rehabilitation therapies Monday-Friday, with modified therapy programming on the weekends.  Your rehabilitation program will include the following services:  Physical Therapy (PT), Occupational Therapy (OT), Speech Therapy (ST), 24 hour per day rehabilitation nursing, Neuropsychology, Case Management ( Social Worker), Rehabilitation Medicine, Nutrition Services and Pharmacy Services  Weekly team conferences will be held on Wednesday to discuss your progress.  Your Social Worker will talk with you frequently to get your input and to update you on team discussions.  Team conferences with you and your family in attendance may also be held.  Expected length of stay: 2-2.5 weeks  Overall anticipated outcome: supervision/min level  Depending on your progress and recovery, your program may change. Your Social Worker will coordinate services and will keep you informed of any changes. Your Child psychotherapist names and contact numbers are listed  below.  The following services may also be recommended but are not provided by the [facility]:   Driving Evaluations  Home Health Rehabiltiation Services  Outpatient Rehabilitatation [facility]  Vocational Rehabilitation   Arrangements will be made to provide these services after discharge if needed.  Arrangements include referral to agencies that provide these services.  Your insurance has been verified to be:  [facility] Your primary doctor is:  DR  Sharlot Gowda  Pertinent information will be shared with your doctor and your insurance  company.  Social Worker:  Dossie Der, [place] [phone]  Information discussed with and copy given to patient by: Lucy Chris, 10/15/2012, 10:59 AM

## 2012-10-15 NOTE — Progress Notes (Signed)
Physical Therapy Session Note  Patient Details  Name: Micheal Gonzalez MRN: [id number] Date of Birth: 04-01-1961  Today's Date: 10/15/2012 Time: 0905-1005 Time Calculation (min): 60 min  Short Term Goals: Week 1:  PT Short Term Goal 1 (Week 1): Pt will roll L with supervision, 0-1 VCs. PT Short Term Goal 2 (Week 1): Pt will transfer stand/pivot to L with min assist. PT Short Term Goal 3 (Week 1): Pt will propel w/c x 150' with supervision. PT Short Term Goal 4 (Week 1): Pt will perform gait x 20' with LRAD and assistance of 1 person. PT Short Term Goal 5 (Week 1): Pt will ascend/descend 3 steps with mod assist.  Skilled Therapeutic Interventions/Progress Updates:   Discussed transfers/ safety in room with OT.  Pt will use squat pivot transfer to L and R in room with nsg.  Treatment focused on neuromuscular re-education via demo, manual and VCs for RLE stance and swing phase components in supine and L sidelying, trunk shortening/lengthening in dynamic sitting, standing, repetitive sit><squat without use of UEs, rolling L.  Standing static and dynamic balance in hallway using L rail, working on R knee extension, biased toward LLE.  Pt unable to extends R knee.  Stepping with RLE results in scissoring due to hip adduction.    W/c propulsion using hemi technique x 150' with supervision.  Returned to room; pt stated he needed to use toilet.  Toilet transfer to L squat pivot mod assist. NT informed that pt on toilet.    Therapy Documentation Precautions:  Precautions Precautions: Fall Precaution Comments:  (decreased balance; right sided weakness) Restrictions Weight Bearing Restrictions: No   Pain: Pain Assessment Pain Assessment: No/denies pain   Other Treatments: Treatments Neuromuscular Facilitation: Right;Forced use;Activity to increase sustained activation;Lower Extremity;Activity to increase motor control;Activity to increase timing and sequencing;Activity to increase  anterior-posterior weight shifting;Activity to increase lateral weight shifting Weight Bearing Technique Weight Bearing Technique: Yes RUE Weight Bearing Technique: Forearm seated  See FIM for current functional status  Therapy/Group: Individual Therapy  Ronnika Collett 10/15/2012, 3:21 PM

## 2012-10-15 NOTE — Progress Notes (Signed)
Speech Language Pathology Daily Session Note  Patient Details  Name: Micheal Gonzalez MRN: [id number] Date of Birth: 10-28-1960  Today's Date: 10/15/2012 Time: 1330-1409 Time Calculation (min): 39 min  Short Term Goals: Week 1: SLP Short Term Goal 1 (Week 1): Patient will participate in oral motor exercises with minA multimodal cueing in order to increae intelligible speech.   SLP Short Term Goal 2 (Week 1): Patient will identify compensatory strategies to increase intelligible speech with min A multimodal cueing. SLP Short Term Goal 3 (Week 1): Patient will utlize compensatory strategies to increase intelligible speech at the conversation level with min A verbal cues.    Skilled Therapeutic Interventions: Skilled treatment session focused on addressing speech intelligibility at the phrase level.  SLP educated patient regarding oral motor exercises and speech compensatory strategies; patient able to return demonstration with Supervision verbal cues.  SLP facilitated session with structured phrase level task that required patient to describe written people, places and things with slow over articulated speech.  Patient's intelligibility increased from Mod to Min assist during this task.  Continue with current plan of care.    FIM:  Comprehension Comprehension Mode: Auditory Comprehension: 7-Follows complex conversation/direction: With no assist Expression Expression Mode: Verbal Expression: 3-Expresses basic 50 - 74% of the time/requires cueing 25 - 50% of the time. Needs to repeat parts of sentences. Social Interaction Social Interaction: 6-Interacts appropriately with others with medication or extra time (anti-anxiety, antidepressant). Problem Solving Problem Solving: 6-Solves complex problems: With extra time Memory Memory: 7-Complete Independence: No helper FIM - Eating Eating Activity: 7: Complete independence:no helper  Pain Pain Assessment Pain Assessment: No/denies  pain  Therapy/Group: Individual Therapy  Charlane Ferretti., CCC-SLP [phone]  Micheal Gonzalez 10/15/2012, 2:54 PM

## 2012-10-15 NOTE — Progress Notes (Signed)
Social Work Assessment and Plan Social Work Assessment and Plan  Patient Details  Name: Micheal Gonzalez MRN: [id number] Date of Birth: 12-07-1960  Today's Date: 10/15/2012  Problem List:  Patient Active Problem List   Diagnosis Date Noted  . CVA (cerebral infarction) 10/11/2012  . Unsteady gait 10/09/2012  . HTN (hypertension) 05/31/2012  . Family history of CVA 05/17/2012  . History of renal stone 05/17/2012  . Allergic rhinitis, mild 05/17/2012  . RLS (restless legs syndrome) 01/06/2011  . Allergic rhinitis due to pollen 01/06/2011  . ED (erectile dysfunction) 01/06/2011  . Family history of colon cancer 01/06/2011  . Kidney stones 01/06/2011  . Family history of heart disease in male family member before age 42 01/06/2011   Past Medical History:  Past Medical History  Diagnosis Date  . Allergy   . ED (erectile dysfunction)   . RLS (restless legs syndrome)   . Folliculitis barbae   . Insomnia   . Insomnia    Past Surgical History:  Past Surgical History  Procedure Laterality Date  . Colonoscopy  03/25/11   Social History:  reports that he has never smoked. He has never used smokeless tobacco. He reports that he drinks about 1.0 ounces of alcohol per week. He reports that he does not use illicit drugs.  Family / Support Systems Marital Status: Single Patient Roles: Other (Comment) (Employee) Other Supports: Samella Parr  [phone]  Ray Moses-friend [phone]-cell  Carmelina Noun [phone]-cell Anticipated Caregiver: [name] is here from [place] to provide care to pt for however long is needed. Ability/Limitations of Caregiver: No limitations Caregiver Availability: 24/7 Family Dynamics: Pt is close with his siblings and family.  He has very close friends who are there for him and will assist him at discharge.  He feels fortunate to have them.  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  He will work hard and adapt with what he needs to do. Recent Psychosocial Issues: Other medical issues-sister recently had a stroke and is recovering from Pyschiatric History: No history-deferred depression screen due to pt tired form therapies and felt unneccessary.  He seemes to be coping appropriately and still processing all that has happened to him.  May benefit form Neuro-psych eval will ask to see. Substance Abuse History: No issues  Patient / Family Perceptions, Expectations & Goals Pt/Family understanding of illness & functional limitations: Pt has a good understanding of his stroke and deficits.  He speaks daily with his MD and addressed his questions and concerns.  He is encouraged by his progress thus far but expects to make much more while here. Premorbid pt/family roles/activities: Friend, Employee, Home owner, [employer], etc Anticipated changes in roles/activities/participation: resume Pt/family expectations/goals: Pt states: " I want to be as independent as possible before I leave here."  His friend is here already and observing in therapies.  Community [employer] Agencies: None Premorbid Home Care/DME  Agencies: None Transportation available at discharge: Friend Resource referrals recommended: Support group  (specify) (CVA Support group)  Discharge Planning Living Arrangements: Alone Support Systems: Other relatives;Friends/neighbors;Church/faith community Type of Residence: Private residence Insurance Resources: Media planner (specify) [facility]) Financial Resources: Employment Financial Screen Referred: No Living Expenses: Own Money Management: Patient Do you have any problems obtaining your medications?: No Home Management: Self Patient/Family Preliminary Plans: Return home with [name] assisting him.  She is here already from [place] to assist for as long as needed.  Pt has other friends who are supportive and willing to assist. Social Work Anticipated Follow Up Needs: HH/OP;Support Group  Clinical Impression Pleasant motivated gentleman who is willing to do what it takes to regain his independence.  Jamila-friend here from [place] to assist him and provide support.  Lucy Chris 10/15/2012, 2:41 PM

## 2012-10-15 NOTE — Progress Notes (Signed)
Occupational Therapy Session Note  Patient Details  Name: Micheal Gonzalez MRN: [id number] Date of Birth: May 05, 1960  Today's Date: 10/15/2012 Time: 0800-0900 and 1100-1140 Time Calculation (min): 60 min and 40 min  Short Term Goals: Week 1:  OT Short Term Goal 1 (Week 1): Pt. will groom self at sink in standing with minimal assist OT Short Term Goal 2 (Week 1): Pt. will use RUE as stabilizer with mod assist. OT Short Term Goal 3 (Week 1): Pt. transfer to tub/shower with minimal assist OT Short Term Goal 4 (Week 1): Pt. will dress self with minimal assist OT Short Term Goal 5 (Week 1): Pt. will transfer to toilet with minimal assist.    Skilled Therapeutic Interventions/Progress Updates:    Visit 1: No c/o pain.  Pt seen for BADL retraining of B/D at shower level and dressing from EOB with a focus on RUE management, dynamic sitting balance, trunk control, and sit to stand.  Pt had preferred to transfer with stand pivot but due to poor RLE motor control, encouraged patient to to perform squat pivot transfers for now.  He bathed on tub bench with mod assist, will be more independent if he has a long handled sponge.   He also was able to don a shirt with not cues demonstrating good perceptual skills.  Mod assist with LB dressing focusing on safe foot placement prior to reaching down to his right foot. Pt worked on crossing right over left to don pants with 50% help over right foot.  Completed grooming at sink with set up only. Pt's PT arrived for his next session.  Visit 2: No c/o pain. Pt seen to work on toilet transfers using squat pivot and grab bar with min - mod assist for guiding. In gym, simulated the transfer again from w/c to mat 8x focusing on leaning forward and in opposition to his hips. He was able to complete transfers with only guiding assist, learning this new skill quickly. Rue weight bearing with reaching to the right with LUE.  Lateral weight shifting activity to increase active  pelvic control to increase sitting balance. Pt was able to lift each hip slightly off mat.  A/Arom and tapping to RUE with towel slides on table. Pt had minimal active movement in shoulder retraction, horizontal adduction, and elbow flexion.  Trace in shoulder flexion and increased tone in finger flexors. Pt transferred to bed to rest with call light and phone in reach.  Therapy Documentation Precautions:  Precautions Precautions: Fall Precaution Comments:  (decreased balance; right sided weakness) Restrictions Weight Bearing Restrictions: No   Pain: Pain Assessment Pain Assessment: No/denies pain ADL:  See FIM for current functional status  Therapy/Group: Individual Therapy  Om Lizotte 10/15/2012, 12:02 PM

## 2012-10-15 NOTE — Plan of Care (Signed)
Problem: RH SAFETY Goal: RH STG ADHERE TO SAFETY PRECAUTIONS W/ASSISTANCE/DEVICE STG Adhere to Safety Precautions With supervison  Outcome: Not Progressing Slip off of side of bed= needs reinforcement to call for assistnce when items fall or cannot be reached  Problem: RH KNOWLEDGE DEFICIT Goal: RH STG INCREASE KNOWLEDGE OF HYPERTENSION Patient will be able to verbalize methods to manage high blood pressure at home.( diet, medications, follow up with PCP, etc.)  Outcome: Progressing Reinforced education on HTN and HLD, food choices, meal selection, etc. Handout given and reviewed

## 2012-10-15 NOTE — Progress Notes (Signed)
Called to room by NT making rounds. Found pt sitting on buttocks on side of bed holding on to rainling with lft arm/hand. Noted he thought he could reach for chapstick that had fallen in floor and did not realize that he would fall forward (rt side LE weakness/UE paresis due to stroke) and not be able to catch himself and ended up sliding off side of bed onto the floor. Pt wearing wind suit pants and slips easily on dermatherapy linen. No injury noted, did not hit head. VS taken and assisted back to bed. Reinforced need to seek assistance and allow assistance to reach for items if not accessible until rt side is improved.  Reinforced staff available and able to assist. Dan Finland PA notified. Pamelia Hoit

## 2012-10-16 ENCOUNTER — Inpatient Hospital Stay (HOSPITAL_COMMUNITY): Payer: 59 | Admitting: Occupational Therapy

## 2012-10-16 ENCOUNTER — Inpatient Hospital Stay (HOSPITAL_COMMUNITY): Payer: 59 | Admitting: Speech Pathology

## 2012-10-16 ENCOUNTER — Inpatient Hospital Stay (HOSPITAL_COMMUNITY): Payer: 59 | Admitting: Physical Therapy

## 2012-10-16 ENCOUNTER — Inpatient Hospital Stay (HOSPITAL_COMMUNITY): Payer: 59 | Admitting: *Deleted

## 2012-10-16 DIAGNOSIS — I6330 Cerebral infarction due to thrombosis of unspecified cerebral artery: Secondary | ICD-10-CM

## 2012-10-16 DIAGNOSIS — G8110 Spastic hemiplegia affecting unspecified side: Secondary | ICD-10-CM

## 2012-10-16 MED ORDER — BOOST / RESOURCE BREEZE PO LIQD
1.0000 | Freq: Four times a day (QID) | ORAL | Status: DC
  Administered 2012-10-16 – 2012-10-26 (×31): 1 via ORAL

## 2012-10-16 MED ORDER — TIZANIDINE HCL 4 MG PO TABS
4.0000 mg | ORAL_TABLET | Freq: Every day | ORAL | Status: DC
  Administered 2012-10-16: 4 mg via ORAL
  Filled 2012-10-16 (×2): qty 1

## 2012-10-16 NOTE — Progress Notes (Signed)
Occupational Therapy Session Note  Patient Details  Name: Micheal Gonzalez MRN: [id number] Date of Birth: 1960-12-02  Today's Date: 10/16/2012 Time: 0800-0900 and 1100-1135 Time Calculation (min): 60 min and 35 min  Short Term Goals: Week 1:  OT Short Term Goal 1 (Week 1): Pt. will groom self at sink in standing with minimal assist OT Short Term Goal 2 (Week 1): Pt. will use RUE as stabilizer with mod assist. OT Short Term Goal 3 (Week 1): Pt. transfer to tub/shower with minimal assist OT Short Term Goal 4 (Week 1): Pt. will dress self with minimal assist OT Short Term Goal 5 (Week 1): Pt. will transfer to toilet with minimal assist.    Skilled Therapeutic Interventions/Progress Updates:    Visit 1: No c/o pain.  Pt seen for BADL retraining of B/D at shower level with a focus on dynamic sitting balance, use of RUE as a stabilizing assist to open/ close deoderant.  Pt stood holding onto grab bar in shower to allow therapist to wash his bottom, but to don pants pt was able to stand with mod assist to allow pt to pull his pants up.  In shower, pt actively elevated shoulder. NMES to wrist extensors for 10 min (see tx below). Good response during tx, but no carry over post tx.  Pt resting in w/c at end of session with call light in reach.  Visit 2:  Pt taken to gym to address RUE active movement with a/arom and grasping and releasing activities. Today he was able to actively flex right arm to 90 degrees and place hand on lap when arm was hanging by his side. He worked on [employer] on table with increased shoulder flexion movement and grasping and releasing cones with assist from therapist to stabilize wrist and tone in fingers held the cone, pt used active shoulder horizontal adduction to bring arm across body.  Pt also worked on sit>< stand multiple times without UE support to increase trunk control of forward flexion with controlled sit. Pt returned to room to sit in recliner with call light in  reach.  Therapy Documentation Precautions:  Precautions Precautions: Fall Precaution Comments:  (decreased balance; right sided weakness) Restrictions Weight Bearing Restrictions: No   Other Treatments: Modalities Modalities: Archivist Stimulation Location: wrist extensors Electrical Stimulation Action: wrist and finger extension Electrical Stimulation Parameters: 35 pps, intensity 26, 10 sec on and off Electrical Stimulation Goals: Neuromuscular facilitation  See FIM for current functional status  Therapy/Group: Individual Therapy  Jim Philemon 10/16/2012, 9:26 AM

## 2012-10-16 NOTE — Consult Note (Signed)
NEUROBEHAVIORAL STATUS EXAM - CONFIDENTIAL  Inpatient Rehabilitation   MEDICAL NECESSITY:  [name] was seen on the [facility] Inpatient Rehabilitation Unit for a neurobehavioral status exam owing to the patient's diagnosis of stroke, and to assist in treatment planning during admission.   According to medical records, Mr. 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 Mr. Barro feels that he is making strides in therapy and endorsed no adjustment issues about being in the hospital. He denied experiencing any significant cognitive issues and only motor symptoms following the stroke. However, he was observed to repeat himself several times throughout the assessment. And although he denied experiencing any cognitive impairment, he was agreeable to further neuropsychological screen to assess for any impairments he might not be aware of.  PROCEDURES ADMINISTERED: [2 units [id number] on 10/15/12] Diagnostic clinical interview  Review of available records Mental Status Exam-2 (brief version) Geriatric Depression Inventory  Beck Depression Inventory - Fast Screen  MENTAL STATUS: Mr. Gries' mental status exam score was above the cutoff used to indicate severe cognitive impairment or dementia. He was fully oriented to person, place, time and situation, but he could not recall all of the previously presented words after only a very short delay.  Emotional & Behavioral Evaluation: Mr. Tagg was appropriately dressed for season and situation, and he appeared tidy and well-groomed. Normal posture was noted. He was friendly and rapport was easily established. His speech was mildly  dysfluent owing to hemi-facial motor issues. He seemed to understand test directions readily. His affect was somewhat flat. Attention and motivation were good. Optimal test taking conditions were maintained.  From an emotional standpoint, on a brief self-report inventory, Mr. Ponciano denied experiencing any significant symptoms of depression. No anxiety-related symptoms were endorsed. Suicidal/homicidal ideation, plan or intent was denied. No manic or hypomanic episodes were reported. The patient denied ever experiencing any auditory/visual hallucinations. No major behavioral or personality changes were endorsed.    Overall, Mr. Novosad denied experiencing any significant cognitive impairment, nor any adjustment issues related to his current hospitalization. However, there were mild indications that he could be suffering from memory loss or other cognitive difficulties, particularly in light of his high estimated premorbid intellectual functioning. As such, he is scheduled to complete a more thorough neuropsychological screen this Thursday with Dr. Wylene Simmer.   DIAGNOSIS:  Cerebral infarction   Debbe Mounts, Psy.D.  Clinical Neuropsychologist

## 2012-10-16 NOTE — Progress Notes (Signed)
Patient ID: Micheal Gonzalez, male   DOB: 11/26/1960, 52 y.o.   MRN: [id number]  Subjective/Complaints: 52 y.o. male admitted on 10/09/12 with right sided numbness with RLE Heaviness as well as lightheadedness. 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 2D echo with EF 55-60%. Carotid dopplers without ICA stenosis. 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 PT/OT evaluations pending. Patient with right hemiparesis with sensory deficits and difficulty with mobility--did have worsening of symptoms yesterday post BP meds. Neurology feels patient with progression and/or hypoperfusion. RUE remains flaccid  Arm and leg spasms lessened last noc  Review of Systems  Neurological: Positive for sensory change and focal weakness.  All other systems reviewed and are negative.   Objective: Vital Signs: Blood pressure 126/82, pulse 64, temperature 97.7 F (36.5 C), temperature source Oral, resp. rate 18, height 6\' 1"  (1.854 m), weight 102.3 kg (225 lb 8.5 oz), SpO2 94.00%. No results found. No results found for this or any previous visit (from the past 72 hour(s)).    Constitutional: He is oriented to person, place, and time. He appears well-developed and well-nourished.  HENT: oral mucosa pink and moist  Head: Normocephalic and atraumatic.  Eyes: Pupils are equal, round, and reactive to light.  Neck: Normal range of motion. Neck supple.  Cardiovascular: Normal rate and regular rhythm. No murmurs or gallops  No murmur heard.  Pulmonary/Chest: Effort normal and breath sounds normal. No respiratory distress.  Abdominal: Soft. Bowel sounds are normal. Non tender non distended Musculoskeletal: He exhibits no edema and no tenderness.  Neurological: He is alert and oriented to person, place, and time.  Mild right facial weakness with minimal  dysarthria. Follows commands without difficulty. Right hemiparesis LE>UE with sensory deficits.  Skin: Skin is warm and dry.  Psychiatric: He has a normal mood and affect. His behavior is normal. Thought content normal.  Right central 7 with dysarthric speech. Right pec, biceps 0 to tr/5. 0/5 with trice, wrist, HI, RLE is 3-/5 with HF and KE, 0/5 with ADF and APF. Diminished PP and LT in the right hand face. No resting tone. DTR's 2-3+ on Right side no clonus. Cognitively appropriate.    Assessment/Plan: 1. 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 Physiatrist is providing close team supervision and 24 hour management of active medical problems listed below. Physiatrist and rehab team continue to assess barriers to discharge/monitor patient progress toward functional and medical goals. FIM: FIM - Bathing Bathing Steps Patient Completed: Chest;Right Arm;Abdomen;Front perineal area;Right upper leg;Left upper leg Bathing: 3: Mod-Patient completes 5-7 85f 10 parts or 50-74%  FIM - Upper Body Dressing/Undressing Upper body dressing/undressing steps patient completed: Thread/unthread right sleeve of pullover shirt/dresss;Thread/unthread left sleeve of pullover shirt/dress;Put head through opening of pull over shirt/dress;Pull shirt over trunk Upper body dressing/undressing: 5: Supervision: Safety issues/verbal cues FIM - Lower Body Dressing/Undressing Lower body dressing/undressing steps patient completed: Thread/unthread left underwear leg;Pull underwear up/down;Thread/unthread left pants leg;Pull pants up/down;Don/Doff left sock;Don/Doff right sock;Don/Doff left shoe Lower body dressing/undressing: 3: Mod-Patient completed 50-74% of tasks  FIM - Toileting Toileting steps completed by patient: Adjust clothing prior to toileting;Performs perineal  hygiene;Adjust clothing after toileting Toileting Assistive Devices: Grab bar or rail for support Toileting: 4: Assist with fasteners  FIM - Diplomatic Services operational officer  Devices: Therapist, music Transfers: 3-To toilet/BSC: Mod A (lift or lower assist)  FIM - Banker Devices: Arm rests Bed/Chair Transfer: 3: Bed > Chair or W/C: Mod A (lift or lower assist);3: Chair or W/C > Bed: Mod A (lift or lower assist)  FIM - Locomotion: Wheelchair Locomotion: Wheelchair: 5: Travels 150 ft or more: maneuvers on rugs and over door sills with supervision, cueing or coaxing FIM - Locomotion: Ambulation Ambulation/Gait Assistance: 1: +2 Total assist Locomotion: Ambulation: 0: Activity did not occur  Comprehension Comprehension Mode: Auditory Comprehension: 7-Follows complex conversation/direction: With no assist  Expression Expression Mode: Verbal Expression: 3-Expresses basic 50 - 74% of the time/requires cueing 25 - 50% of the time. Needs to repeat parts of sentences.  Social Interaction Social Interaction: 6-Interacts appropriately with others with medication or extra time (anti-anxiety, antidepressant).  Problem Solving Problem Solving: 6-Solves complex problems: With extra time  Memory Memory: 7-Complete Independence: No helper Medical Problem List and Plan:  1. DVT Prophylaxis/Anticoagulation: Pharmaceutical: Lovenox  2. Pain Management: N/A  3. Mood: Provide ego support. Mild underlying anxiety with recent progression. LCSW to follow for evaluation.  4. Neuropsych: This patient is capable of making decisions on his own behalf.  5. HTN: Will monitor with bid checks. Use meds prn SBP >180 or DBP>110. Allow BP to run a little high for now--is still elevated in a week to add medication per neuro.  6. RLS: Chronic , Will continue klonopin and Requip. Will increase klonopin slightly as he is having more cramping and spasms at night and  he's had more problems sleeping since stroke.  7. Hyperlipidemia: On statin to get LDL <100.  8. Insomnia: D/C trazodone. Has spasms that interfere with sleep increase Tizanidine -klonopin as above   LOS (Days) 4 A FACE TO FACE EVALUATION WAS PERFORMED  KIRSTEINS,ANDREW E 10/16/2012, 8:29 AM

## 2012-10-16 NOTE — Evaluation (Signed)
Recreational Therapy Assessment and Plan  Patient Details  Name: Micheal Gonzalez MRN: [id number] Date of Birth: 1960-12-14 Today's Date: 10/16/2012  Rehab Potential: Good ELOS:  2-2.5 weeks Assessment Clinical Impression: Problem List:  Patient Active Problem List    Diagnosis  Date Noted   .  CVA (cerebral infarction)  10/11/2012   .  Unsteady gait  10/09/2012   .  HTN (hypertension)  05/31/2012   .  Family history of CVA  05/17/2012   .  History of renal stone  05/17/2012   .  Allergic rhinitis, mild  05/17/2012   .  RLS (restless legs syndrome)  01/06/2011   .  Allergic rhinitis due to pollen  01/06/2011   .  ED (erectile dysfunction)  01/06/2011   .  Family history of colon cancer  01/06/2011   .  Kidney stones  01/06/2011   .  Family history of heart disease in male family member before age 71  01/06/2011    Past Medical History:  Past Medical History   Diagnosis  Date   .  Allergy    .  ED (erectile dysfunction)    .  RLS (restless legs syndrome)    .  Folliculitis barbae    .  Insomnia    .  Insomnia     Past Surgical History:  Past Surgical History   Procedure  Laterality  Date   .  Colonoscopy   03/25/11    Assessment & Plan  Clinical Impression:Micheal Gonzalez is a 52 y.o. RH male admitted on 10/09/12 with right sided numbness with RLE Heaviness as well as lightheadedness. 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 2D echo with EF 55-60%. Carotid dopplers without ICA stenosis. 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 PT/OT evaluations pending. Patient with right hemiparesis with sensory deficits and difficulty with mobility--did have worsening of symptoms yesterday post BP meds. Neurology feels patient with progression and/or hypoperfusion.  Patient transferred to CIR on 10/12/2012.  Pt presents  with decreased activity tolerance, decreased functional mobility, decreased balance, right sided weakness, dysarthria, & insomnia Limiting pt's independence with leisure/community pursuits.   Leisure History/Participation Premorbid leisure interest/current participation: Garment/textile technologist - Travel (Comment);Community - Press photographer - Physicist, medical Scientist, product/process development at [employer]) Expression Interests: Music (Comment) Other Leisure Interests: Television;Movies;Reading;Computer Leisure Participation Style: Alone;With Family/Friends Awareness of Community Resources: Good-identify 3 post discharge leisure resources Psychosocial / Spiritual Spiritual Interests: Church Patient agreeable to Pet Therapy: No Does patient have pets?: No Social interaction - Mood/Behavior: Cooperative Film/video editor for Education?: Yes Patient Agreeable to Outing?: Yes Recreational Therapy Orientation Orientation -Reviewed with patient: Available activity resources;Use of Dayroom Strengths/Weaknesses Patient Strengths/Abilities: Willingness to participate;Active premorbidly Patient weaknesses: Physical limitations  Plan Rec Therapy Plan Is patient appropriate for Therapeutic Recreation?: Yes Rehab Potential: Good Treatment times per week: Min 1 time per week >20 minutes TR Treatment/Interventions: Adaptive equipment instruction;1:1 session;Balance/vestibular training;Functional mobility training;Community reintegration;Cognitive remediation/compensation;Patient/family education;Therapeutic activities;Recreation/leisure participation;Therapeutic exercise;UE/LE Coordination activities;Wheelchair propulsion/positioning Recommendations for other services: Neuropsych  Recommendations for other services: Neuropsych  Discharge Criteria: Patient will be discharged from TR if patient refuses treatment 3 consecutive times without medical reason.  If treatment goals not met, if there is a change in  medical status, if patient makes no progress towards goals or if patient is discharged from hospital.  The above assessment, treatment plan, treatment alternatives and goals  were discussed and mutually agreed upon: by patient  Sigmond Patalano 10/16/2012, 4:42 PM

## 2012-10-16 NOTE — Progress Notes (Signed)
Speech Language Pathology Daily Session Note  Patient Details  Name: Micheal Gonzalez MRN: [id number] Date of Birth: 19-May-1960  Today's Date: 10/16/2012 Time: 4098-1191 Time Calculation (min): 45 min  Short Term Goals: Week 1: SLP Short Term Goal 1 (Week 1): Patient will participate in oral motor exercises with minA multimodal cueing in order to increae intelligible speech.   SLP Short Term Goal 2 (Week 1): Patient will identify compensatory strategies to increase intelligible speech with min A multimodal cueing. SLP Short Term Goal 3 (Week 1): Patient will utlize compensatory strategies to increase intelligible speech at the conversation level with min A verbal cues.    Skilled Therapeutic Interventions: Skilled treatment session focused on addressing speech intelligibility.  Patient required Mod faded to Min assist verbal cues to slow pace of speech and utilize over articulation during structured tasks that required patient to generate sentences after being provided with three written words.  SLP re-enforced use of oral motor exercises to increase strength and ROM as well as oral reading to facilitate improved endurance.  Continue with current plan of care.   FIM:  Comprehension Comprehension Mode: Auditory Comprehension: 7-Follows complex conversation/direction: With no assist Expression Expression Mode: Verbal Expression: 3-Expresses basic 50 - 74% of the time/requires cueing 25 - 50% of the time. Needs to repeat parts of sentences. Social Interaction Social Interaction: 6-Interacts appropriately with others with medication or extra time (anti-anxiety, antidepressant). Problem Solving Problem Solving: 6-Solves complex problems: With extra time Memory Memory: 7-Complete Independence: No helper  Pain Pain Assessment Pain Assessment: No/denies pain  Therapy/Group: Individual Therapy  Charlane Ferretti., CCC-SLP [phone]  Micheal Gonzalez 10/16/2012, 4:10 PM

## 2012-10-16 NOTE — Progress Notes (Signed)
Physical Therapy Session Note  Patient Details  Name: Micheal Gonzalez MRN: [id number] Date of Birth: [birth year]  Today's Date: 10/16/2012 Time: 9:32-10:30 ( )   Short Term Goals: Week 1:  PT Short Term Goal 1 (Week 1): Pt will roll L with supervision, 0-1 VCs. PT Short Term Goal 2 (Week 1): Pt will transfer stand/pivot to L with min assist. PT Short Term Goal 3 (Week 1): Pt will propel w/c x 150' with supervision. PT Short Term Goal 4 (Week 1): Pt will perform gait x 20' with LRAD and assistance of 1 person. PT Short Term Goal 5 (Week 1): Pt will ascend/descend 3 steps with mod assist.  Skilled Therapeutic Interventions/Progress Updates:  Tx focused on transfer training, WC mobility, NMR for RLE activation, and gait training with 2 person HHA. Pt not sleeping well, reporting fatigue.  Pt needing to use restroom upon arrival: Mod A for WC<>toilet with cues for technique.   Pt propelled WC x150' with S and cues for safe brake use before moving leg rests. Demonstrated optimal WC positioning for tranfers and leg rest operation.   Demonstrated steps for squat-pivot transfer for scooting forward, foot placement, and forward lean/turn. Pt needing up to Mod A still to complete turn and control lowering. Practiced WC<>mat x3 throughout tx and WC>recliner.   NMR for RLE extension strengthening sit<>stand from mat, focusing on R weight-shift x8, also having pt slide R arm up mirror to increase trunk ext and RLE ext. Pt continues to have difficulty with TKE.   Gait training with 2 person HHA x30' with cues and manual facilitation for anterior translation over BOS, blocking R knee, R weight shift, and R foot placement. R foot tends to adduct, but pt is able to get swing through. Pt improved L step greatly throughout course of walk.   Step tap with RLE to 2" step with target for RLE strengthening and coordination x10.     Therapy Documentation Precautions:  Precautions Precautions:  Fall Precaution Comments:  (decreased balance; right sided weakness) Restrictions Weight Bearing Restrictions: No   Pain: Pain Assessment Pain Assessment: 0-10 Pain Score: 0-No pain Patients Stated Pain Goal: 0 Multiple Pain Sites: No   Locomotion : Ambulation Ambulation/Gait Assistance: 1: +2 Total assist Wheelchair Mobility Distance: 150    Other Treatments: Modalities Modalities: Archivist Stimulation Location: wrist extensors Electrical Stimulation Action: wrist and finger extension Electrical Stimulation Parameters: 35 pps, intensity 26, 10 sec on and off Electrical Stimulation Goals: Neuromuscular facilitation  See FIM for current functional status  Therapy/Group: Individual Therapy  Clydene Laming, PT, DPT 10/16/2012, 10:20 AM

## 2012-10-17 ENCOUNTER — Inpatient Hospital Stay (HOSPITAL_COMMUNITY): Payer: 59

## 2012-10-17 ENCOUNTER — Inpatient Hospital Stay (HOSPITAL_COMMUNITY): Payer: 59 | Admitting: *Deleted

## 2012-10-17 ENCOUNTER — Inpatient Hospital Stay (HOSPITAL_COMMUNITY): Payer: 59 | Admitting: Occupational Therapy

## 2012-10-17 ENCOUNTER — Inpatient Hospital Stay (HOSPITAL_COMMUNITY): Payer: 59 | Admitting: Speech Pathology

## 2012-10-17 MED ORDER — METHOCARBAMOL 500 MG PO TABS
750.0000 mg | ORAL_TABLET | Freq: Four times a day (QID) | ORAL | Status: DC | PRN
  Administered 2012-10-17 – 2012-10-20 (×6): 750 mg via ORAL
  Filled 2012-10-17 (×6): qty 2

## 2012-10-17 MED ORDER — ROPINIROLE HCL 1 MG PO TABS
2.0000 mg | ORAL_TABLET | Freq: Every day | ORAL | Status: DC
  Administered 2012-10-17 – 2012-10-19 (×3): 2 mg via ORAL
  Filled 2012-10-17 (×5): qty 2

## 2012-10-17 NOTE — Progress Notes (Signed)
Occupational Therapy Session Note  Patient Details  Name: Micheal Gonzalez MRN: [id number] Date of Birth: December 09, 1960  Today's Date: 10/17/2012 Time: 0800-0900 and 1100-1135 Time Calculation (min): 60 min and 35 min  Short Term Goals: Week 1:  OT Short Term Goal 1 (Week 1): Pt. will groom self at sink in standing with minimal assist OT Short Term Goal 2 (Week 1): Pt. will use RUE as stabilizer with mod assist. OT Short Term Goal 3 (Week 1): Pt. transfer to tub/shower with minimal assist OT Short Term Goal 4 (Week 1): Pt. will dress self with minimal assist OT Short Term Goal 5 (Week 1): Pt. will transfer to toilet with minimal assist.    Skilled Therapeutic Interventions/Progress Updates:    Visit 1: No c/o pain. Pt seen for ADL retraining of bathing at shower level and dressing from w/c at sink with a focus on sit to stand, squat pivot transfers, and standing balance. Pt completed B/D with min assist. He donned pants over feet and then stood at sink with RUE supported on sink for weight bearing while he used LUE to pull pants up. Pt needed facilitation through RLE to maintain knee in extension. Lateral weight shifting in standing with use of sink mirror for visual feedback for upright and symmetrical posture.  Pt continued tx in the gym with standing at hi/lo table with RUE weight bearing and mod assist to facilitate standing. He also worked on a/arom with pushing hand forward and back and in circles. In sitting, RUE on table for horizontal add and abd ROM. Pt has improved muscle control in flexion patterns. Pt went to his next PT session.  Visit 2: No c/o pain. Pt seen for NMES with estim to wrist extensors 10 min, 35 pps, intensity 18, 10 sec on and off. Pt worked on grasping and releasing cones during the estim session.  Bilateral hands on large therapy ball for pushing into ball and rolling ball forward and back on knees for increased shoulder control and tricep extension.  A/AROM for elbow  flex/ext.  Pt transferred to recliner from w/c with min assist squat pivot.  Call light and phone in reach.  Therapy Documentation Precautions:  Precautions Precautions: Fall Precaution Comments:  (decreased balance; right sided weakness) Restrictions Weight Bearing Restrictions: No      ADL:  See FIM for current functional status  Therapy/Group: Individual Therapy  SAGUIER,JULIA 10/17/2012, 9:10 AM

## 2012-10-17 NOTE — Progress Notes (Signed)
Social Work Patient ID: Micheal Gonzalez, male   DOB: 11-04-1960, 53 y.o.   MRN: [id number] Met with pt to inform team conference goals-supervision/min level and discharge 7/10.  Pt reports; " That sounds long." Discussed will meet each Wednesday to discuss progress toward goals and may be able to move up if progresses more quickly. He is pleased with the progress he has made thus far.  Discussed OP therapies at discharge and he does have transportation.  Will continue to work On discharge plans and continue to discuss discharge date.

## 2012-10-17 NOTE — Progress Notes (Signed)
Patient ID: Micheal Gonzalez, male   DOB: [birth year], 52 y.o.   MRN: [id number]  Subjective/Complaints: 52 y.o. male admitted on 10/09/12 with right sided numbness with RLE Heaviness as well as lightheadedness. 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 2D echo with EF 55-60%. Carotid dopplers without ICA stenosis. 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 PT/OT evaluations pending. Patient with right hemiparesis with sensory deficits and difficulty with mobility--did have worsening of symptoms yesterday post BP meds. Neurology feels patient with progression and/or hypoperfusion. RUE remains flaccid  Arm and leg spasms, seemed to respond better to Robaxin than tizanidine Slept poorly Appreciate Neuropsych eval  Review of Systems  Neurological: Positive for sensory change and focal weakness.  All other systems reviewed and are negative.   Objective: Vital Signs: Blood pressure 144/91, pulse 62, temperature 97.5 F (36.4 C), temperature source Oral, resp. rate 18, height 6\' 1"  (1.854 m), weight 102.3 kg (225 lb 8.5 oz), SpO2 95.00%. No results found. No results found for this or any previous visit (from the past 72 hour(s)).    Constitutional: He is oriented to person, place, and time. He appears well-developed and well-nourished.  HENT: oral mucosa pink and moist  Head: Normocephalic and atraumatic.  Eyes: Pupils are equal, round, and reactive to light.  Neck: Normal range of motion. Neck supple.  Cardiovascular: Normal rate and regular rhythm. No murmurs or gallops  No murmur heard.  Pulmonary/Chest: Effort normal and breath sounds normal. No respiratory distress.  Abdominal: Soft. Bowel sounds are normal. Non tender non distended Musculoskeletal: He exhibits no edema and no tenderness.  Neurological: He is alert and oriented  to person, place, and time.  Mild right facial weakness with minimal dysarthria. Follows commands without difficulty. Right hemiparesis LE>UE with sensory deficits.  Skin: Skin is warm and dry.  Psychiatric: He has a normal mood and affect. His behavior is normal. Thought content normal.  Right central 7 with dysarthric speech. Right pec, biceps 0 to tr/5. 0/5 with trice, wrist, HI, RLE is 3-/5 with HF and KE, 0/5 with ADF and APF. Diminished PP and LT in the right hand face. No resting tone. DTR's 2-3+ on Right side no clonus. Cognitively appropriate.    Assessment/Plan: 1. 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 Physiatrist is providing close team supervision and 24 hour management of active medical problems listed below. Physiatrist and rehab team continue to assess barriers to discharge/monitor patient progress toward functional and medical goals. Team conference today please see physician documentation under team conference tab, met with team face-to-face to discuss problems,progress, and goals. Formulized individual treatment plan based on medical history, underlying problem and comorbidities. FIM: FIM - Bathing Bathing Steps Patient Completed: Chest;Right Arm;Left Arm;Abdomen;Front perineal area;Right upper leg;Left upper leg;Right lower leg (including foot);Left lower leg (including foot) Bathing: 4: Min-Patient completes 8-9 19f 10 parts or 75+ percent (uses long sponge)  FIM - Upper Body Dressing/Undressing Upper body dressing/undressing steps patient completed: Thread/unthread right sleeve of pullover shirt/dresss;Thread/unthread left sleeve of pullover shirt/dress;Put head through opening of pull over shirt/dress;Pull shirt over trunk Upper body dressing/undressing: 5: Supervision: Safety issues/verbal cues FIM - Lower Body  Dressing/Undressing Lower body dressing/undressing steps patient completed: Thread/unthread left underwear leg;Pull underwear up/down;Thread/unthread left pants leg;Pull pants up/down;Don/Doff left  sock;Don/Doff right sock;Don/Doff left shoe Lower body dressing/undressing: 3: Mod-Patient completed 50-74% of tasks  FIM - Toileting Toileting steps completed by patient: Adjust clothing prior to toileting;Performs perineal hygiene;Adjust clothing after toileting Toileting Assistive Devices: Grab bar or rail for support Toileting: 4: Steadying assist  FIM - Diplomatic Services operational officer Devices: Grab bars Toilet Transfers: 3-To toilet/BSC: Mod A (lift or lower assist);3-From toilet/BSC: Mod A (lift or lower assist)  FIM - Press photographer Assistive Devices: Arm rests Bed/Chair Transfer: 3: Bed > Chair or W/C: Mod A (lift or lower assist);3: Chair or W/C > Bed: Mod A (lift or lower assist)  FIM - Locomotion: Wheelchair Distance: 150 Locomotion: Wheelchair: 5: Travels 150 ft or more: maneuvers on rugs and over door sills with supervision, cueing or coaxing FIM - Locomotion: Ambulation Locomotion: Ambulation Assistive Devices: Other (comment) (2 person HHA) Ambulation/Gait Assistance: 1: +2 Total assist Locomotion: Ambulation: 1: Two helpers  Comprehension Comprehension Mode: Auditory Comprehension: 7-Follows complex conversation/direction: With no assist  Expression Expression Mode: Verbal Expression: 3-Expresses basic 50 - 74% of the time/requires cueing 25 - 50% of the time. Needs to repeat parts of sentences.  Social Interaction Social Interaction: 6-Interacts appropriately with others with medication or extra time (anti-anxiety, antidepressant).  Problem Solving Problem Solving: 6-Solves complex problems: With extra time  Memory Memory: 7-Complete Independence: No helper Medical Problem List and Plan:  1. DVT Prophylaxis/Anticoagulation:  Pharmaceutical: Lovenox  2. Pain Management: N/A  3. Mood: Provide ego support. Mild underlying anxiety with recent progression. LCSW to follow for evaluation.  4. Neuropsych: This patient is capable of making decisions on his own behalf.  5. HTN: Will monitor with bid checks. Use meds prn SBP >180 or DBP>110. Allow BP to run a little high for now--is still elevated in a week to add medication per neuro.  6. RLS: Chronic , Will continue klonopin and Requip. Will increase klonopin slightly as he is having more cramping and spasms at night and he's had more problems sleeping since stroke.  7. Hyperlipidemia: On statin to get LDL <100.  8. Insomnia: D/C trazodone. Has spasms that interfere with sleep, no clinical signs of spasticity, D/C tizanidine, increase robaxin -klonopin as above   LOS (Days) 5 A FACE TO FACE EVALUATION WAS PERFORMED  Tristian Bouska E 10/17/2012, 8:16 AM

## 2012-10-17 NOTE — Progress Notes (Signed)
Social Work Patient ID: Micheal Gonzalez, male   DOB: [birth year], 52 y.o.   MRN: [id number] Met with pt and girlfriend-Micheal Gonzalez to discuss concerns regarding his sleep depreciation and outcomes in therapy.  Pt feels he can not  Put forth his maximum effort due to this issue.  He wants team to be aware of this, so his evaluation and stay here is not so long.  He feels the  Target discharge date is too long, that once spasms are under control he will be able to be home and get OP therapies.  Encouraged him to speak with MD Regarding this and team will work on education with [name] when they feel it is safe to do transfers and possibly ambulation.  Pt is aware the discharge date is not set in stone. Will continue to work on discharge needs and team aware to educate Micheal Gonzalez when appropriate. Pt to speak with MD about concerns in am.

## 2012-10-17 NOTE — Patient Care Conference (Signed)
Inpatient RehabilitationTeam Conference and Plan of Care Update Date: 10/17/2012   Time: 11:30 AM    Patient Name: Micheal Gonzalez      Medical Record Number: [id number]  Date of Birth: 08-01-1960 Sex: Male         Room/Bed: 4149/4149-01 Payor Info: Payor: Advertising copywriter / Plan: Advertising copywriter / Product Type: *No Product type* /    Admitting Diagnosis: L CVA  Admit Date/Time:  10/12/2012  3:22 PM Admission Comments: No comment available   Primary Diagnosis:  CVA (cerebral infarction) Principal Problem: CVA (cerebral infarction)  Patient Active Problem List   Diagnosis Date Noted  . CVA (cerebral infarction) 10/11/2012  . Unsteady gait 10/09/2012  . HTN (hypertension) 05/31/2012  . Family history of CVA 05/17/2012  . History of renal stone 05/17/2012  . Allergic rhinitis, mild 05/17/2012  . RLS (restless legs syndrome) 01/06/2011  . Allergic rhinitis due to pollen 01/06/2011  . ED (erectile dysfunction) 01/06/2011  . Family history of colon cancer 01/06/2011  . Kidney stones 01/06/2011  . Family history of heart disease in male family member before age 68 01/06/2011    Expected Discharge Date: Expected Discharge Date: 11/01/12  Team Members Present: Physician leading conference: Dr. Claudette Laws Social Worker Present: Dossie Der, LCSW Nurse Present: Gregor Hams, RN PT Present: Edman Circle, PT;Caroline Adriana Simas, PT;Other (comment) Clarisse Gouge Ripa-PT) OT Present: Other (comment);Leonette Monarch, Loistine Chance, OT (Kayla Perkinson-OT) SLP Present: Fae Pippin, SLP Other (Discipline and Name): marie Noel-PPS     Current Status/Progress Goal Weekly Team Focus  Medical   severe hemiparesis, recall issues noted by Neuropsych  NM re education, further assessment of cognition  see above   Bowel/Bladder   continent of bowel and bladder. LBM 10-16-12  remain continent of B/B.  remain continent  toilet as needed   Swallow/Nutrition/ Hydration     na         ADL's   min assist bathing, supervision UB dressing, mod assist LB dressing, mod assist stand pivot, min squat pivot transfers, developing RUE active movement  supervision overall  ADL retraining, functional mobility, RUE neuro re-ed, e-stim,  pt education   Mobility   Mod A transfers, S WC mobility, +2 gait short distance - getting return  S for transfers and WC mobility, Min A gait x50'  RLE NMR, gait with device, transfer safety   Communication   Min-Mod  Mod I   increase knowledge of effective strategies and self-monitoring and correcting   Safety/Cognition/ Behavioral Observations  slid to floor on Monday, bed alarm in use, frequent reminders to call for assist of items out of reach or on floor  no falls with injkury, least restrictive safety device in use to prevent falls      Pain   no complaint of pain, complaint of spasms  decrease the number of spasms the pt has a night.   medicate as needed decrease spasms   Skin   no skin issues noted   no new breakdown or infection while on rehab  assess and monitor q shift       *See Care Plan and progress notes for long and short-term goals.  Barriers to Discharge: heavy physical assist level    Possible Resolutions to Barriers:  cont rehab    Discharge Planning/Teaching Needs:  Home with friend-Jamila who is here and observing in therapies-who can provide 24 hour care      Team Discussion:  Working on spasms at night time-interrupting sleep.  Neuro-psych  to see tomorrow regarding testing.  Making good progress in therapies.  Revisions to Treatment Plan:  None   Continued Need for Acute Rehabilitation Level of Care: The patient requires daily medical management by a physician with specialized training in physical medicine and rehabilitation for the following conditions: Daily direction of a multidisciplinary physical rehabilitation program to ensure safe treatment while eliciting the highest outcome that is of practical value to  the patient.: Yes Daily medical management of patient stability for increased activity during participation in an intensive rehabilitation regime.: Yes Daily analysis of laboratory values and/or radiology reports with any subsequent need for medication adjustment of medical intervention for : Neurological problems  Griffey Nicasio, Lemar Livings 10/18/2012, 8:27 AM

## 2012-10-17 NOTE — Progress Notes (Signed)
Recreational Therapy Session Note  Patient Details  Name: Micheal Gonzalez MRN: [id number] Date of Birth: [birth year] Today's Date: 10/17/2012 Time:  9147-8295 Pain: no c/o Skilled Therapeutic Interventions/Progress Updates: Session focused on discharge planning, use of aromatherapy, relaxation techniques to potentially assist with sleep with pt and pt's girlfriend.  Pt stated that his projected discharge date was too far away, feeling that his rehab stay is too long.  When questioned about current status and his goals for discharge, pt stated that lack of sleep was the most limiting factor in his progress.  Pt stated he felt like his insomnia has been worse since admission due to muscle spasms & noise on the unit.  Pt also stated that he felt like muscle relaxants made him groggy and that it effected his ability to fully participate in therapies giving examples that he wasn't able to speak clearly or perform physically as he could because of the meds.  Pt continually stated that "he was marked down" in performance because of this.  Discussed with pt the use of aroma therapy per hospital policy and relaxation techniques (diaphragmatic breathing, visualization, mantra meditation) in attempt to assist with sleep.  Pt stated willingness to incorporate these into his evening/nightly routine.  LRT/CTRS did not demonstrate or implement the use of aroma therpay/essential oils due to the current use of fragrances within the room.  Will follow up with pt tomorrow about his use of the above mentioned techniques.  Also shared the above concerns pertaining to discharge with Kriste Basque, SW for follow up and to [place], [place] Therapy/Group: Individual Therapy   Keshia Weare 10/17/2012, 3:57 PM

## 2012-10-17 NOTE — Progress Notes (Signed)
Speech Language Pathology Daily Session Note & Weekly Progress Note  Patient Details  Name: Micheal Gonzalez MRN: [id number] Date of Birth: [birth year]  Today's Date: 10/17/2012 Time: 1500-1530 Time Calculation (min): 30 min  Short Term Goals: Week 1: SLP Short Term Goal 1 (Week 1): Patient will participate in oral motor exercises with minA multimodal cueing in order to increae intelligible speech.   SLP Short Term Goal 2 (Week 1): Patient will identify compensatory strategies to increase intelligible speech with min A multimodal cueing. SLP Short Term Goal 3 (Week 1): Patient will utlize compensatory strategies to increase intelligible speech at the conversation level with min A verbal cues.    Skilled Therapeutic Interventions: Skilled treatment session focused on addressing speech intelligibility.  SLP facilitated session with complex sentences that patient was asked to read aloud.  Audio recorder was used to facilitated awareness and self-monitoring.  Patient was able to identify the need to use pausing/pacing and then required Min faded to Supervision level verbal cues as session progressed.  Patient states that oral motor musculature is weak and SLP recommended continued use of oral motor exercises on own; patient agreed and verbalized understanding.  Patient and significant other expressed concern regarding impact of fatigue on his speech and thing that going home sooner with be beneficial.  They also requested to reduce frequency to 3x week.  Continue with current plan of care.   FIM:  Comprehension Comprehension Mode: Auditory Comprehension: 7-Follows complex conversation/direction: With no assist Expression Expression Mode: Verbal Expression: 4-Expresses basic 75 - 89% of the time/requires cueing 10 - 24% of the time. Needs helper to occlude trach/needs to repeat words. Social Interaction Social Interaction: 6-Interacts appropriately with others with medication or extra time  (anti-anxiety, antidepressant). Problem Solving Problem Solving: 6-Solves complex problems: With extra time Memory Memory: 7-Complete Independence: No helper  Pain Pain Assessment Pain Assessment: No/denies pain  Therapy/Group: Individual Therapy   Speech Language Pathology Weekly Progress Note  Patient Details  Name: Micheal Gonzalez MRN: [id number] Date of Birth: 1961/03/19  Today's Date: 10/18/2012  Short Term Goals: Week 1: SLP Short Term Goal 1 (Week 1): Patient will participate in oral motor exercises with minA multimodal cueing in order to increae intelligible speech.   SLP Short Term Goal 1 - Progress (Week 1): Met SLP Short Term Goal 2 (Week 1): Patient will identify compensatory strategies to increase intelligible speech with min A multimodal cueing. SLP Short Term Goal 2 - Progress (Week 1): Met SLP Short Term Goal 3 (Week 1): Patient will utlize compensatory strategies to increase intelligible speech at the conversation level with min A verbal cues.   SLP Short Term Goal 3 - Progress (Week 1): Met Week 2: SLP Short Term Goal 1 (Week 2): Patient will perform oral motor exercises with Modified Independence. SLP Short Term Goal 2 (Week 2): Patient will identify compensatory strategies to increase intelligible speech Modified Independence. SLP Short Term Goal 3 (Week 2): Patient will utlize compensatory strategies to increase intelligible speech at the conversation level with Supervision level cues from clinician or significant other.    Weekly Progress Updates: Patient met 3 out of 3 short term goals this reporting period due to gains in ability to perform oral motor exercises, identify effective speech intelligibility strategies and use them in structured sentence level tasks. Patient continues to demonstrate decreased intelligibility at the conversational level due to decreased pacing.   As a result, it is recommended that he continue to receive skilled SLP services to  address these deficits at a frequency of 3 times a week for the next week to maximize funcitoanl communication abilities.    SLP Intensity: Minumum of 1-2 x/day, 30 to 90 minutes SLP Frequency: 3 out of 7 days SLP Duration/Estimated Length of Stay: 1 week for SLP SLP Treatment/Interventions: Oral motor exercises;Patient/family education;Therapeutic Exercise;Therapeutic Activities;Speech/Language facilitation  Charlane Ferretti., CCC-SLP [phone]  Hudsen Fei 10/17/2012, 4:31 PM

## 2012-10-17 NOTE — Progress Notes (Addendum)
Physical Therapy Session Note  Patient Details  Name: Micheal Gonzalez MRN: [id number] Date of Birth: Apr 02, 1961  Today's Date: 10/17/2012 Time:0900-0945 and  1478-2956 Time Calculation (min): 45 and 30 min  Short Term Goals: Week 1:  PT Short Term Goal 1 (Week 1): Pt will roll L with supervision, 0-1 VCs. PT Short Term Goal 2 (Week 1): Pt will transfer stand/pivot to L with min assist. PT Short Term Goal 3 (Week 1): Pt will propel w/c x 150' with supervision. PT Short Term Goal 4 (Week 1): Pt will perform gait x 20' with LRAD and assistance of 1 person. PT Short Term Goal 5 (Week 1): Pt will ascend/descend 3 steps with mod assist.  Skilled Therapeutic Interventions/Progress Updates:   treatment 1:  focused on neuromuscular re-education via VCS, demo, manual cues for RLE stance stability and swing phase components, trunk shortening/lengthening for dynamic sitting balance, transfer training for RLE activation for pivoting, gait in parallel bars with ACE on LLE for foot drop  Gait x 8' forward and backwards in parallel bars, max assist, ACE on RLE, R hand in pants pocket.  Standing in parallel bars with = wt bearing, LUE support, RUE in pants pocket, pt worked on R knee extension without hyperextension, ACE on RLE.  W/c Mobility x 150' with supervision on level tile in hall and in room, VCs in congested area for w/c set up for transfer.    W/c> bed to L stand pivot with mod assist.Pt requested to rest in bed; sit> supine with close supervision, max VCS for RLE management. Pt positioned in supine with RUE support.    Pt continues to be very fatigued due to poor sleep at night.  Therapist left all needs within reach, lights outs and door closed, bed alarm on.  Treatment 2 focused on bed mobility, family ed with pt's girlfriend Jamila, neuromuscular re-education, transfer.  Bedside tx,  teaching girlfriend HEP of bil bridging, RLe heel slide and bil lower trunk rotation, with use of  hand out, as well as use of pillows, towel rolls to facilitate pt's sidelying positions; needs reinforcement.  PTA pt preferred to sleep on R side, or on abdomen.    Bed> w/c transfer to r stand pivot with min/mod assist.    Therapy Documentation Precautions:  Precautions Precautions: Fall Precaution Comments:  (decreased balance; right sided weakness) Restrictions Weight Bearing Restrictions: No Pain:- none reported      Other Treatments: Treatments Neuromuscular Facilitation: Right;Forced use;Activity to increase sustained activation;Lower Extremity;Activity to increase motor control;Activity to increase timing and sequencing;Activity to increase anterior-posterior weight shifting;Activity to increase lateral weight shifting  See FIM for current functional status  Therapy/Group: Individual Therapy  Halley Shepheard 10/17/2012, 3:55 PM

## 2012-10-18 ENCOUNTER — Encounter (HOSPITAL_COMMUNITY): Payer: 59

## 2012-10-18 ENCOUNTER — Inpatient Hospital Stay (HOSPITAL_COMMUNITY): Payer: 59

## 2012-10-18 ENCOUNTER — Inpatient Hospital Stay (HOSPITAL_COMMUNITY): Payer: 59 | Admitting: *Deleted

## 2012-10-18 ENCOUNTER — Inpatient Hospital Stay (HOSPITAL_COMMUNITY): Payer: 59 | Admitting: Occupational Therapy

## 2012-10-18 DIAGNOSIS — I6330 Cerebral infarction due to thrombosis of unspecified cerebral artery: Secondary | ICD-10-CM

## 2012-10-18 DIAGNOSIS — G8110 Spastic hemiplegia affecting unspecified side: Secondary | ICD-10-CM

## 2012-10-18 NOTE — Progress Notes (Signed)
Recreational Therapy Session Note  Patient Details  Name: Micheal Gonzalez MRN: [id number] Date of Birth: 1960/12/03 Today's Date: 10/18/2012 Time:  1030-11 Pain: no c/o Skilled Therapeutic Interventions/Progress Updates: Out & about on hospital grounds w/c level with discussion about current therapy status, last night's sleep and general leisure.  Pt smiling and laughing throughout session today.    Therapy/Group: Individual Therapy  Batu Cassin 10/18/2012, 3:48 PM

## 2012-10-18 NOTE — Progress Notes (Signed)
Occupational Therapy Session Note  Patient Details  Name: Micheal Gonzalez MRN: [id number] Date of Birth: 1961/03/23  Today's Date: 10/18/2012 Time: 1321-1400 Time Calculation (min): 39 min  Short Term Goals: Week 1:  OT Short Term Goal 1 (Week 1): Pt. will groom self at sink in standing with minimal assist OT Short Term Goal 2 (Week 1): Pt. will use RUE as stabilizer with mod assist. OT Short Term Goal 3 (Week 1): Pt. transfer to tub/shower with minimal assist OT Short Term Goal 4 (Week 1): Pt. will dress self with minimal assist OT Short Term Goal 5 (Week 1): Pt. will transfer to toilet with minimal assist.    Skilled Therapeutic Interventions/Progress Updates:    Therapy session focused on standing balance, weightshifting, and NMR to RUE. Pt engaged in therapeutic activity in standing while weightbearing through RUE. Engaged in activity where reached across body with LUE to retrieve item and increase weight shift to R. Tolerated standing with min-mod assist approx 8 min. Engaged in active movement of adduction as pushed cones across table. While sitting pt engaged in shoulder control and tricep extension activity of rolling ball use AAROM. PROM provided prior as tightness noted with elbow extension. Pt then engaged activity from w/c level of squeezing large ball together with BUE and pt motivated as he could visually see RUE assisting with applying pressure to ball.   Therapy Documentation Precautions:  Precautions Precautions: Fall Precaution Comments:  (decreased balance; right sided weakness) Restrictions Weight Bearing Restrictions: No General: General Amount of Missed OT Time (min): 6 Minutes Vital Signs:   Pain: No c/o pain during therapy session.  See FIM for current functional status  Therapy/Group: Individual Therapy  Daneil Dan 10/18/2012, 2:15 PM

## 2012-10-18 NOTE — Progress Notes (Signed)
Patient ID: Micheal Gonzalez, male   DOB: 05-May-1960, 52 y.o.   MRN: [id number]  Subjective/Complaints: 52 y.o. male admitted on 10/09/12 with right sided numbness with RLE Heaviness as well as lightheadedness. 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 2D echo with EF 55-60%. Carotid dopplers without ICA stenosis. 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 PT/OT evaluations pending. Patient with right hemiparesis with sensory deficits and difficulty with mobility--did have worsening of symptoms yesterday post BP meds. Neurology feels patient with progression and/or hypoperfusion. RUE remains flaccid  Arm and leg spasms, seemed to respond better to Robaxin than tizanidine Slept a little better Appreciate Neuropsych eval Would like to be D/C sooner than 7/10  Review of Systems  Neurological: Positive for sensory change and focal weakness.  All other systems reviewed and are negative.   Objective: Vital Signs: Blood pressure 117/74, pulse 76, temperature 97.6 F (36.4 C), temperature source Oral, resp. rate 19, height 6\' 1"  (1.854 m), weight 97.8 kg (215 lb 9.8 oz), SpO2 96.00%. No results found. No results found for this or any previous visit (from the past 72 hour(s)).    Constitutional: He is oriented to person, place, and time. He appears well-developed and well-nourished.  HENT: oral mucosa pink and moist  Head: Normocephalic and atraumatic.  Eyes: Pupils are equal, round, and reactive to light.  Neck: Normal range of motion. Neck supple.  Cardiovascular: Normal rate and regular rhythm. No murmurs or gallops  No murmur heard.  Pulmonary/Chest: Effort normal and breath sounds normal. No respiratory distress.  Abdominal: Soft. Bowel sounds are normal. Non tender non distended Musculoskeletal: He exhibits no edema and no  tenderness.  Neurological: He is alert and oriented to person, place, and time.  Mild right facial weakness with minimal dysarthria. Follows commands without difficulty. Right hemiparesis LE>UE with sensory deficits.  Skin: Skin is warm and dry.  Psychiatric: He has a normal mood and affect. His behavior is normal. Thought content normal.  Right central 7 with dysarthric speech. Right pec, biceps 0 to tr/5. 0/5 with trice, wrist, HI, RLE is 3-/5 with HF and KE, 0/5 with ADF and APF. Diminished PP and LT in the right hand face. No resting tone. DTR's 2-3+ on Right side no clonus. Cognitively appropriate.    Assessment/Plan: 1. 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 Physiatrist is providing close team supervision and 24 hour management of active medical problems listed below. Physiatrist and rehab team continue to assess barriers to discharge/monitor patient progress toward functional and medical goals.  FIM: FIM - Bathing Bathing Steps Patient Completed: Chest;Right Arm;Left Arm;Abdomen;Front perineal area;Right upper leg;Left upper leg;Right lower leg (including foot);Left lower leg (including foot) Bathing: 4: Min-Patient completes 8-9 75f 10 parts or 75+ percent  FIM - Upper Body Dressing/Undressing Upper body dressing/undressing steps patient completed: Thread/unthread right sleeve of pullover shirt/dresss;Thread/unthread left sleeve of pullover shirt/dress;Put head through opening of pull over shirt/dress;Pull shirt over trunk Upper body dressing/undressing: 5: Supervision: Safety issues/verbal cues FIM - Lower Body Dressing/Undressing Lower body dressing/undressing steps patient completed: Thread/unthread right underwear leg;Thread/unthread left underwear leg;Pull underwear up/down;Thread/unthread left pants leg;Pull pants up/down;Don/Doff  right sock;Don/Doff left sock;Don/Doff left shoe (8 out of 10 steps) Lower body dressing/undressing: 4: Min-Patient completed 75 plus % of tasks  FIM - Toileting Toileting steps completed by patient: Adjust clothing prior to toileting;Performs perineal hygiene;Adjust clothing after toileting Toileting Assistive Devices: Grab bar or rail for support Toileting: 4: Steadying assist  FIM - Diplomatic Services operational officer Devices: Grab bars Toilet Transfers: 3-To toilet/BSC: Mod A (lift or lower assist)  FIM - Banker Devices: Bed rails;HOB elevated Bed/Chair Transfer: 3: Chair or W/C > Bed: Mod A (lift or lower assist)  FIM - Locomotion: Wheelchair Distance: 150 Locomotion: Wheelchair: 5: Travels 150 ft or more: maneuvers on rugs and over door sills with supervision, cueing or coaxing FIM - Locomotion: Ambulation Locomotion: Ambulation Assistive Devices: Parallel bars Ambulation/Gait Assistance: 2: Max assist Locomotion: Ambulation: 1: Travels less than 50 ft with maximal assistance (Pt: 25 - 49%) (8')  Comprehension Comprehension Mode: Auditory Comprehension: 6-Follows complex conversation/direction: With extra time/assistive device  Expression Expression Mode: Verbal Expression: 4-Expresses basic 75 - 89% of the time/requires cueing 10 - 24% of the time. Needs helper to occlude trach/needs to repeat words.  Social Interaction Social Interaction: 6-Interacts appropriately with others with medication or extra time (anti-anxiety, antidepressant).  Problem Solving Problem Solving: 6-Solves complex problems: With extra time  Memory Memory: 7-Complete Independence: No helper Medical Problem List and Plan:  1. DVT Prophylaxis/Anticoagulation: Pharmaceutical: Lovenox  2. Pain Management: N/A  3. Mood: Provide ego support. Mild underlying anxiety with recent progression. LCSW to follow for evaluation.  4. Neuropsych: This patient is  capable of making decisions on his own behalf.  5. HTN: Will monitor with bid checks. Use meds prn SBP >180 or DBP>110. Allow BP to run a little high for now--is still elevated in a week to add medication per neuro.  6. RLS: Chronic , Will continue klonopin and Requip. Will increase klonopin slightly as he is having more cramping and spasms at night and he's had more problems sleeping since stroke.  7. Hyperlipidemia: On statin to get LDL <100.  8. Insomnia: D/C trazodone. Has spasms that interfere with sleep, developing spasticity, seems to respond to  Robaxin rather than tizanidine  -klonopin as above   LOS (Days) 6 A FACE TO FACE EVALUATION WAS PERFORMED  KIRSTEINS,ANDREW E 10/18/2012, 9:20 AM

## 2012-10-18 NOTE — Progress Notes (Signed)
Occupational Therapy Session Notes  Patient Details  Name: Micheal Gonzalez MRN: [id number] Date of Birth: 1961-03-05  Today's Date: 10/18/2012 Time: 1105-1205 and 310-340 Time Calculation (min): 60 min and 30 min  Short Term Goals: Week 1:  OT Short Term Goal 1 (Week 1): Pt. will groom self at sink in standing with minimal assist OT Short Term Goal 2 (Week 1): Pt. will use RUE as stabilizer with mod assist. OT Short Term Goal 3 (Week 1): Pt. transfer to tub/shower with minimal assist OT Short Term Goal 4 (Week 1): Pt. will dress self with minimal assist OT Short Term Goal 5 (Week 1): Pt. will transfer to toilet with minimal assist.    Skilled Therapeutic Interventions/Progress Updates:  1)  Self care retraining to include shower, dress, and groom.  Focused session on weight bearing through RUE & RLE, sit><stands and standing balance/tolerance with RLE in optimal position to assist with functional mobility and transitional movements, adaptive techniques for self care tasks and safe shower and bed squat pivot and stand pivot transfers.  2)  Patient in w/c upon arrival with girlfriend at his side.  Patient propelled w/c to therapy gym first using LUE & LLE then propelled w/c backwards using only BLEs with focus on RLE assisting.  Stand step transfer to mat for NMR to include RUE heavy weight bearing while seated EOM, reaching forward and to the right. Increase RUE activation using the UE Ranger, transitional movements, bed mobility and pain-free bed positioning while lying on his affected right side.  Squat pivot>w/c>bed and assisted patient onto his right side secondary reporting very tired and not sleeping well.  Therapy Documentation Precautions:  Precautions Precautions: Fall Precaution Comments:  (decreased balance; right sided weakness) Restrictions Weight Bearing Restrictions: No Pain: Denies pain in both sessions ADL: See FIM for current functional status  Therapy/Group:  Individual Therapy for both sessions.  Johanan Skorupski 10/18/2012, 12:17 PM

## 2012-10-18 NOTE — Progress Notes (Addendum)
Physical Therapy Session Note  Patient Details  Name: Micheal Gonzalez MRN: [id number] Date of Birth: 06/06/1960  Today's Date: 10/18/2012 Time: 0800-0900 Time Calculation (min): 60 min  Short Term Goals: Week 1:  PT Short Term Goal 1 (Week 1): Pt will roll L with supervision, 0-1 VCs. PT Short Term Goal 2 (Week 1): Pt will transfer stand/pivot to L with min assist. PT Short Term Goal 3 (Week 1): Pt will propel w/c x 150' with supervision. PT Short Term Goal 4 (Week 1): Pt will perform gait x 20' with LRAD and assistance of 1 person. PT Short Term Goal 5 (Week 1): Pt will ascend/descend 3 steps with mod assist.  Skilled Therapeutic Interventions/Progress Updates:   Treatment focused on sitting balance during donning shoes bedside, with close supervision; neuromuscular re-education via tactile and manual cues, VCs for RLE stance and swing phase components of gait in sitting and standing in static stander, gait; transfers..  W/c > mat to the L, stand pivot with min assist; squat pivot to the R with min/mod assist.  Gait with EVA walker, +2 for RLE guidance and walker guidance, x 20', focusing on R foot placement due to excessive adduction, RLe stance stability.  W/c propulsion using hemi method with LUE and LLE, distant supervision, x 150'.      Therapy Documentation Precautions:  Precautions Precautions: Fall Precaution Comments:  (decreased balance; right sided weakness) Restrictions Weight Bearing Restrictions: No   Pain: Pain Assessment Pain Assessment: No/denies pain  Other Treatments: Treatments Neuromuscular Facilitation: Right;Forced use;Activity to increase sustained activation;Lower Extremity;Activity to increase motor control;Activity to increase timing and sequencing;Activity to increase anterior-posterior weight shifting;Activity to increase lateral weight shifting Weight Bearing Technique RUE Weight Bearing Technique: Forearm standing  See FIM for current  functional status  Therapy/Group: Individual Therapy  Ardell Aaronson 10/18/2012, 9:24 AM

## 2012-10-19 ENCOUNTER — Inpatient Hospital Stay (HOSPITAL_COMMUNITY): Payer: 59 | Admitting: *Deleted

## 2012-10-19 ENCOUNTER — Inpatient Hospital Stay (HOSPITAL_COMMUNITY): Payer: 59

## 2012-10-19 LAB — CREATININE, SERUM
Creatinine, Ser: 1.26 mg/dL (ref 0.50–1.35)
GFR calc Af Amer: 74 mL/min — ABNORMAL LOW (ref >90)
GFR calc non Af Amer: 64 mL/min — ABNORMAL LOW (ref >90)

## 2012-10-19 NOTE — Progress Notes (Signed)
Occupational Therapy Session Notes  Patient Details  Name: Micheal Gonzalez MRN: [id number] Date of Birth: 07-28-1960  Today's Date: 10/19/2012  Short Term Goals: Week 1:  OT Short Term Goal 1 (Week 1): Pt. will groom self at sink in standing with minimal assist OT Short Term Goal 2 (Week 1): Pt. will use RUE as stabilizer with mod assist. OT Short Term Goal 3 (Week 1): Pt. transfer to tub/shower with minimal assist OT Short Term Goal 4 (Week 1): Pt. will dress self with minimal assist OT Short Term Goal 5 (Week 1): Pt. will transfer to toilet with minimal assist.    Skilled Therapeutic Interventions/Progress Updates:  Session Note #1: Time: 1030-1130 Pt with no report of pain.  Upon entering room, pt seated in w/c. OT pushed pt->BR where he transferred<>tub bench using squat pivot (steady assist). Pt completed bathing from tub bench level with sit<>stand for peri care (mod assist for dynamic standing balance; pt has a tendency to lean backwards). Pt completed dressing task from w/c level with sit<>stand at sink for pulling up pants (min assist for dynamic standing balance). Pt brushed teeth standing at sink with emphasis on wt shifting/bearing through R leg. OT educated pt on strategies to increase independence with this task. OT donned GivMohr sling to RUE. From here, pt propelled w/c using LUE/LLE->ADL BR where he transferred<>tub bench in tub/shower (pt has tub/shower at home). Pt then propelled w/c->room where he transferred->bed using stand pivot (steady assist). Pt requires mod v.c's for attention to R arm placement during tasks. At end of tx session, pt supine in bed with call bell within reach. BA turned on.  Session Note #2: Time: 1345-1430 (45 mins) Pt with no report of pain.  Upon entering room, pt seated in w/c. OT issued dycem to assist pt with maintaining R foot on recliner in order to increase independence with LB dressing. From here skilled intervention focused on dynamic  standing balance/endurance with and without UE support, wt shifting/bearing through BLE's/BUE's, sit<>stand, postural control, RUE ROM using UE ranger, and overall activity tolerance. OT pushed pt->room. Pt required min v.c's for placement/attention to RUE during tasks. At end of tx session, pt seated in w/c with call bell within reach.  Therapy Documentation Precautions:  Precautions Precautions: Fall Precaution Comments:  (decreased balance; right sided weakness) Restrictions Weight Bearing Restrictions: No  See FIM for current functional status  Therapy/Group: Individual Therapy  Culler, Whitley 10/19/2012, 7:28 AM

## 2012-10-19 NOTE — Progress Notes (Signed)
Occupational Therapy Weekly Progress Note  Patient Details  Name: Micheal Gonzalez MRN: [id number] Date of Birth: October 06, 1960  Today's Date: 10/19/2012  Patient has met 4 of 5 short term goals.  Pt completes lateral scoot and squat pivot transfers with min-supervision however requires min-mod for stand pivot transfers. Pt requires occasional cues for safety awareness and awareness of RUE. Pt has GivMohr sling and wears it at times. Verbal cues provided during self-care tasks in standing to use mirror as visual feedback for weightshifting. Pt beginning to show active movement in RUE.   Patient continues to demonstrate the following deficits: right hemiplegia, decreased balance, decreased postural control, decreased awareness, decreased safety, decreased coordination, decreased strength and therefore will continue to benefit from skilled OT intervention to enhance overall performance with BADL and Reduce care partner burden.  Patient progressing toward long term goals..  Continue plan of care.  OT Short Term Goals Week 1:  OT Short Term Goal 1 (Week 1): Pt. will groom self at sink in standing with minimal assist OT Short Term Goal 1 - Progress (Week 1): Met OT Short Term Goal 2 (Week 1): Pt. will use RUE as stabilizer with mod assist. OT Short Term Goal 2 - Progress (Week 1): Met OT Short Term Goal 3 (Week 1): Pt. transfer to tub/shower with minimal assist OT Short Term Goal 3 - Progress (Week 1): Met OT Short Term Goal 4 (Week 1): Pt. will dress self with minimal assist OT Short Term Goal 4 - Progress (Week 1): Met OT Short Term Goal 5 (Week 1): Pt. will transfer to toilet with minimal assist.   OT Short Term Goal 5 - Progress (Week 1): Not met Week 2:  OT Short Term Goal 1 (Week 2): Pt will complete stand pivot tub transfer with min assist  OT Short Term Goal 2 (Week 2): Pt will complete stand pivot toilet transfer with min assist  OT Short Term Goal 3 (Week 2):  Pt will use RUE as  stabilizer during grooming task with 1 verbal cue OT Short Term Goal 4 (Week 2): Pt will complete LB dressing with close supervision for standing balance for management around waist  OT Short Term Goal 5 (Week 2): Pt will complete 2 grooming tasks in standing with close supervision      Therapy Documentation Precautions:  Precautions Precautions: Fall Precaution Comments:  (decreased balance; right sided weakness) Restrictions Weight Bearing Restrictions: No General:   Vital Signs:   Pain: Pain Assessment Pain Assessment: No/denies pain Pain Score: 0-No pain ADL:   Exercises:   Other Treatments:    See FIM for current functional status  Therapy/Group: Individual Therapy  Daneil Dan 10/19/2012, 12:21 PM

## 2012-10-19 NOTE — Progress Notes (Signed)
Patient ID: Micheal Gonzalez, male   DOB: 1961/04/23, 52 y.o.   MRN: [id number]  Subjective/Complaints: 52 y.o. male admitted on 10/09/12 with right sided numbness with RLE Heaviness as well as lightheadedness. 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 2D echo with EF 55-60%. Carotid dopplers without ICA stenosis. 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 PT/OT evaluations pending. Patient with right hemiparesis with sensory deficits and difficulty with mobility--did have worsening of symptoms yesterday post BP meds. Neurology feels patient with progression and/or hypoperfusion. RUE remains flaccid  Arm and leg spasms, seemed to respond better to Robaxin than tizanidine Slept poorly secondary to midnite blood draw ordered by pharmacy Appreciate Neuropsych eval Would like to be D/C sooner than 7/10  Review of Systems  Neurological: Positive for sensory change and focal weakness.  All other systems reviewed and are negative.   Objective: Vital Signs: Blood pressure 145/98, pulse 77, temperature 97.8 F (36.6 C), temperature source Oral, resp. rate 18, height 6\' 1"  (1.854 m), weight 97.8 kg (215 lb 9.8 oz), SpO2 96.00%. No results found. Results for orders placed during the hospital encounter of 10/12/12 (from the past 72 hour(s))  CREATININE, SERUM     Status: Abnormal   Collection Time    10/19/12 12:04 AM      Result Value Range   Creatinine, Ser 1.26  0.50 - 1.35 mg/dL   GFR calc non Af Amer 64 (*) >90 mL/min   GFR calc Af Amer 74 (*) >90 mL/min   Comment:            The eGFR has been calculated     using the CKD EPI equation.     This calculation has not been     validated in all clinical     situations.     eGFR's persistently     <90 mL/min signify     possible Chronic Kidney Disease.      Constitutional: He is  oriented to person, place, and time. He appears well-developed and well-nourished.  HENT: oral mucosa pink and moist  Head: Normocephalic and atraumatic.  Eyes: Pupils are equal, round, and reactive to light.  Neck: Normal range of motion. Neck supple.  Cardiovascular: Normal rate and regular rhythm. No murmurs or gallops  No murmur heard.  Pulmonary/Chest: Effort normal and breath sounds normal. No respiratory distress.  Abdominal: Soft. Bowel sounds are normal. Non tender non distended Musculoskeletal: He exhibits no edema and no tenderness.  Neurological: He is alert and oriented to person, place, and time.  Mild right facial weakness with minimal dysarthria. Follows commands without difficulty. Right hemiparesis LE>UE with sensory deficits.  Skin: Skin is warm and dry.  Psychiatric: He has a normal mood and affect. His behavior is normal. Thought content normal.  Right central 7 with dysarthric speech. Right pec, biceps 0 to tr/5. 0/5 with trice, wrist, HI, RLE is 3-/5 with HF and KE, 0/5 with ADF and APF. Diminished PP and LT in the right hand face. No resting tone. DTR's 2-3+ on Right side no clonus. Cognitively appropriate.    Assessment/Plan: 1. 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 Physiatrist is providing close team supervision and 24 hour management of active medical problems listed below. Physiatrist and rehab team continue  to assess barriers to discharge/monitor patient progress toward functional and medical goals.  FIM: FIM - Bathing Bathing Steps Patient Completed: Chest;Right Arm;Left Arm;Abdomen;Front perineal area;Right upper leg;Left upper leg;Right lower leg (including foot);Left lower leg (including foot);Buttocks Bathing: 4: Steadying assist  FIM - Upper Body Dressing/Undressing Upper body dressing/undressing steps  patient completed: Thread/unthread right sleeve of pullover shirt/dresss;Thread/unthread left sleeve of pullover shirt/dress;Put head through opening of pull over shirt/dress;Pull shirt over trunk Upper body dressing/undressing: 5: Supervision: Safety issues/verbal cues FIM - Lower Body Dressing/Undressing Lower body dressing/undressing steps patient completed: Thread/unthread right underwear leg;Thread/unthread left underwear leg;Pull underwear up/down;Thread/unthread left pants leg;Pull pants up/down;Don/Doff right sock;Don/Doff left sock;Don/Doff left shoe;Don/Doff right shoe (8 out of 10 steps) Lower body dressing/undressing: 4: Min-Patient completed 75 plus % of tasks  FIM - Toileting Toileting steps completed by patient: Adjust clothing prior to toileting;Performs perineal hygiene;Adjust clothing after toileting Toileting Assistive Devices: Grab bar or rail for support Toileting: 4: Steadying assist  FIM - Diplomatic Services operational officer Devices: Grab bars Toilet Transfers: 3-From toilet/BSC: Mod A (lift or lower assist)  FIM - Banker Devices: Arm rests Bed/Chair Transfer: 4: Bed > Chair or W/C: Min A (steadying Pt. > 75%)  FIM - Locomotion: Wheelchair Distance: 150 Locomotion: Wheelchair: 5: Travels 150 ft or more: maneuvers on rugs and over door sills with supervision, cueing or coaxing FIM - Locomotion: Ambulation Locomotion: Ambulation Assistive Devices: Interior and spatial designer Ambulation/Gait Assistance: 1: +2 Total assist Locomotion: Ambulation: 1: Two helpers  Comprehension Comprehension Mode: Auditory Comprehension: 6-Follows complex conversation/direction: With extra time/assistive device  Expression Expression Mode: Verbal Expression: 5-Expresses complex 90% of the time/cues < 10% of the time  Social Interaction Social Interaction: 6-Interacts appropriately with others with medication or extra time (anti-anxiety,  antidepressant).  Problem Solving Problem Solving: 6-Solves complex problems: With extra time  Memory Memory: 6-More than reasonable amt of time Medical Problem List and Plan:  1. DVT Prophylaxis/Anticoagulation: Pharmaceutical: Lovenox  2. Pain Management: N/A  3. Mood: Provide ego support. Mild underlying anxiety with recent progression. LCSW to follow for evaluation.  4. Neuropsych: This patient is capable of making decisions on his own behalf.  5. HTN: Will monitor with bid checks. Use meds prn SBP >180 or DBP>110. Allow BP to run a little high for now--is still elevated in a week to add medication per neuro.  6. RLS: Chronic , Will continue klonopin and Requip. Will increase klonopin slightly as he is having more cramping and spasms at night and he's had more problems sleeping since stroke.  7. Hyperlipidemia: On statin to get LDL <100.  8. Insomnia: D/C trazodone. Has spasms that interfere with sleep, developing spasticity, seems to respond to  Robaxin rather than tizanidine  -klonopin as above   LOS (Days) 7 A FACE TO FACE EVALUATION WAS PERFORMED  Carrah Eppolito E 10/19/2012, 8:32 AM

## 2012-10-19 NOTE — Progress Notes (Signed)
Physical Therapy Weekly Progress Note  Patient Details  Name: Micheal Gonzalez MRN: [id number] Date of Birth: June 24, 1960  Today's Date: 10/19/2012 Time:0900- 1000 and 1308-6578 Time Calculation (min): 60 and 30 min  Patient has met 4 of 5 short term goals.  He partly met gait goal.  Patient continues to demonstrate the following deficits: motor control RUE and RLE, balance, coordination, activity tolerance, functional mobility and therefore will continue to benefit from skilled PT intervention to enhance overall performance with balance, postural control, ability to compensate for deficits and functional use of  right upper extremity and right lower extremity.  Patient progressing toward long term goals..  Continue plan of care.  PT Short Term Goals Week 1:  PT Short Term Goal 1 (Week 1): Pt will roll L with supervision, 0-1 VCs. PT Short Term Goal 1 - Progress (Week 1): Met PT Short Term Goal 2 (Week 1): Pt will transfer stand/pivot to L with min assist. PT Short Term Goal 2 - Progress (Week 1): Met PT Short Term Goal 3 (Week 1): Pt will propel w/c x 150' with supervision. PT Short Term Goal 3 - Progress (Week 1): Met PT Short Term Goal 4 (Week 1): Pt will perform gait x 20' with LRAD and assistance of 1 person. PT Short Term Goal 4 - Progress (Week 1): Partly met PT Short Term Goal 5 (Week 1): Pt will ascend/descend 3 steps with mod assist. PT Short Term Goal 5 - Progress (Week 1): Met  Skilled Therapeutic Interventions/Progress Updates:   Treatment 1:  neuromuscular re-education via demo, VCs, tactile and manual cues for RUE, RLE facilitation for rolling L, RLE stance stability during transfers, standing, gait.  With ACE on RLE for foot drop, up/down up/down 5(5-7") steps with mod assist.  Sit>< stand focusing on RLE wt bearing, foot position.  Gait with ACE as above, x 42' +2 assist, EVA walker, focusing on RLE stance stability, wt shift, upright posture, and x 43' +2 assist  without AD.   Treatment 2:  Recliner> w/c to R with min assist, squat pivot with poor clearance of hips.  Pt tends to transfer quickly and not pivot RLE. W/c> Kinetron to R with min assist squat pivot; to L stand pivot with washcloth under R foot for reduced friction, mod assist focusing on pivoting RLE.  neuromuscular re-education via demo, VCS, manual cues for wt shifting and RLE activation on Kinetron:  in sitting, progressing to standing, bil UE support,ACE on R hand on handle,  30-40 cm/sec resistance, working on RLE stance stability components.      Therapy Documentation Precautions:  Precautions Precautions: Fall Precaution Comments:  (decreased balance; right sided weakness) Restrictions Weight Bearing Restrictions: No   Locomotion : Ambulation Ambulation/Gait Assistance: 1: +2 Total assist Wheelchair Mobility Distance: 150    Other Treatments: Treatments Neuromuscular Facilitation: Right;Forced use;Activity to increase sustained activation;Lower Extremity;Activity to increase motor control;Activity to increase timing and sequencing;Activity to increase anterior-posterior weight shifting;Activity to increase lateral weight shifting  See FIM for current functional status  Therapy/Group: Individual Therapy  Kayliah Tindol 10/19/2012, 5:02 PM

## 2012-10-20 ENCOUNTER — Inpatient Hospital Stay (HOSPITAL_COMMUNITY): Payer: 59 | Admitting: *Deleted

## 2012-10-20 MED ORDER — METHOCARBAMOL 500 MG PO TABS
1000.0000 mg | ORAL_TABLET | Freq: Four times a day (QID) | ORAL | Status: DC | PRN
  Administered 2012-10-20 – 2012-10-21 (×2): 1000 mg via ORAL
  Filled 2012-10-20 (×2): qty 2

## 2012-10-20 MED ORDER — ROPINIROLE HCL 1 MG PO TABS
1.0000 mg | ORAL_TABLET | Freq: Every day | ORAL | Status: DC
  Administered 2012-10-20 – 2012-10-28 (×9): 1 mg via ORAL
  Filled 2012-10-20 (×10): qty 1

## 2012-10-20 NOTE — Progress Notes (Signed)
Note reviewed and accurately reflects treatment session.   [name], MS OTR/L

## 2012-10-20 NOTE — Progress Notes (Signed)
Patient ID: Micheal Gonzalez, male   DOB: [birth year], 52 y.o.   MRN: [id number] Subjective/Complaints: 52 y.o. male admitted on 10/09/12 with right sided numbness with RLE Heaviness as well as lightheadedness. 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 2D echo with EF 55-60%. Carotid dopplers without ICA stenosis. 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 PT/OT evaluations pending. Patient with right hemiparesis with sensory deficits and difficulty with mobility--did have worsening of symptoms yesterday post BP meds. Neurology feels patient with progression and/or hypoperfusion. RUE remains flaccid  Poor sleep, states he was up until 6 AM  Review of Systems  Neurological:       Spasms RLE   Objective: Vital Signs: Blood pressure 115/80, pulse 70, temperature 97.2 F (36.2 C), temperature source Oral, resp. rate 19, height 6\' 1"  (1.854 m), weight 97.8 kg (215 lb 9.8 oz), SpO2 97.00%. No results found. Results for orders placed during the hospital encounter of 10/12/12 (from the past 72 hour(s))  CREATININE, SERUM     Status: Abnormal   Collection Time    10/19/12 12:04 AM      Result Value Range   Creatinine, Ser 1.26  0.50 - 1.35 mg/dL   GFR calc non Af Amer 64 (*) >90 mL/min   GFR calc Af Amer 74 (*) >90 mL/min   Comment:            The eGFR has been calculated     using the CKD EPI equation.     This calculation has not been     validated in all clinical     situations.     eGFR's persistently     <90 mL/min signify     possible Chronic Kidney Disease.    Constitutional: He is oriented to person, place, and time. He appears well-developed and well-nourished.  HENT: oral mucosa pink and moist  Head: Normocephalic and atraumatic.  Eyes: Pupils are equal, round, and reactive to light.  Neck: Normal range of motion. Neck  supple.  Cardiovascular: Normal rate and regular rhythm. No murmurs or gallops  No murmur heard.  Pulmonary/Chest: Effort normal and breath sounds normal. No respiratory distress.  Abdominal: Soft. Bowel sounds are normal. Non tender non distended Musculoskeletal: He exhibits no edema and no tenderness.  Neurological: He is alert and oriented to person, place, and time.  Mild right facial weakness with minimal dysarthria. Follows commands without difficulty. Right hemiparesis LE>UE with sensory deficits.  Skin: Skin is warm and dry.  Psychiatric: He has a normal mood and affect. His behavior is normal. Thought content normal.  Right central 7 with dysarthric speech. Right pec, biceps 0 to tr/5. 0/5 with trice, wrist, HI, RLE is 3-/5 with HF and KE, 0/5 with ADF and APF. Diminished PP and LT in the right hand face. No resting tone. DTR's 2-3+ on Right side no clonus. Cognitively appropriate.    Assessment/Plan: 1. Functional deficits secondary to Right hemiparesis, L PLIC infarct which require 3+ hours per day of interdisciplinary therapy in a comprehensive inpatient rehab setting. Physiatrist is providing close team supervision and 24 hour management of active medical problems listed below. Physiatrist and rehab team continue to assess barriers to discharge/monitor patient progress toward functional and medical goals. FIM: FIM - Bathing Bathing Steps Patient Completed: Chest;Right lower leg (including foot);Left lower leg (including foot);Left Arm;Right Arm;Abdomen;Front perineal area;Buttocks;Right upper leg;Left upper leg  Bathing: 3: Mod-Patient completes 5-7 45f 10 parts or 50-74% (mod assist sit<>stand for peri care)  FIM - Upper Body Dressing/Undressing Upper body dressing/undressing steps patient completed: Thread/unthread right sleeve of pullover shirt/dresss;Thread/unthread left sleeve of pullover shirt/dress;Pull shirt over trunk;Put head through opening of pull over shirt/dress Upper  body dressing/undressing: 5: Set-up assist to: Obtain clothing/put away FIM - Lower Body Dressing/Undressing Lower body dressing/undressing steps patient completed: Thread/unthread right underwear leg;Thread/unthread left underwear leg;Pull underwear up/down;Thread/unthread right pants leg;Thread/unthread left pants leg;Pull pants up/down;Don/Doff left shoe Lower body dressing/undressing: 4: Min-Patient completed 75 plus % of tasks  FIM - Toileting Toileting steps completed by patient: Adjust clothing prior to toileting;Performs perineal hygiene;Adjust clothing after toileting Toileting Assistive Devices: Grab bar or rail for support Toileting: 0: Activity did not occur  FIM - Diplomatic Services operational officer Devices: Grab bars Toilet Transfers: 0-Activity did not occur  FIM - Banker Devices: Arm rests Bed/Chair Transfer: 4: Chair or W/C > Bed: Min A (steadying Pt. > 75%);4: Bed > Chair or W/C: Min A (steadying Pt. > 75%)  FIM - Locomotion: Wheelchair Distance: 150 Locomotion: Wheelchair: 5: Travels 150 ft or more: maneuvers on rugs and over door sills with supervision, cueing or coaxing FIM - Locomotion: Ambulation Locomotion: Ambulation Assistive Devices: Interior and spatial designer Ambulation/Gait Assistance: 1: +2 Total assist Locomotion: Ambulation: 1: Two helpers (43')  Comprehension Comprehension Mode: Auditory Comprehension: 6-Follows complex conversation/direction: With extra time/assistive device  Expression Expression Mode: Verbal Expression: 5-Expresses complex 90% of the time/cues < 10% of the time  Social Interaction Social Interaction: 6-Interacts appropriately with others with medication or extra time (anti-anxiety, antidepressant).  Problem Solving Problem Solving: 5-Solves complex 90% of the time/cues < 10% of the time  Memory Memory: 5-Recognizes or recalls 90% of the time/requires cueing < 10% of the time  Medical  Problem List and Plan:  1. DVT Prophylaxis/Anticoagulation: Pharmaceutical: Lovenox  2. Pain Management: N/A  3. Mood: Provide ego support. Mild underlying anxiety with recent progression. LCSW to follow for evaluation.  4. Neuropsych: This patient is capable of making decisions on his own behalf.  5. HTN: Will monitor with bid checks. Use meds prn SBP >180 or DBP>110. Allow BP to run a little high for now--is still elevated in a week to add medication per neuro.  6. RLS: Chronic , Will continue klonopin and Requip. he's had more problems sleeping since stroke.  7. Hyperlipidemia: On statin to get LDL <100.  8. Insomnia: D/C trazodone. Has spasms that interfere with sleep, developing spasticity, seems to respond to Robaxin rather than tizanidine, will increase dose -klonopin as above   LOS (Days) 8 A FACE TO FACE EVALUATION WAS PERFORMED  Shenna Brissette E 10/20/2012, 10:02 AM

## 2012-10-20 NOTE — Progress Notes (Signed)
Occupational Therapy Session Note  Patient Details  Name: MACAULEY MOSSBERG MRN: [id number] Date of Birth: [birth year]  Today's Date: 10/20/2012  Short Term Goals: Week 1:  OT Short Term Goal 1 (Week 1): Pt. will groom self at sink in standing with minimal assist OT Short Term Goal 1 - Progress (Week 1): Met OT Short Term Goal 2 (Week 1): Pt. will use RUE as stabilizer with mod assist. OT Short Term Goal 2 - Progress (Week 1): Met OT Short Term Goal 3 (Week 1): Pt. transfer to tub/shower with minimal assist OT Short Term Goal 3 - Progress (Week 1): Met OT Short Term Goal 4 (Week 1): Pt. will dress self with minimal assist OT Short Term Goal 4 - Progress (Week 1): Met OT Short Term Goal 5 (Week 1): Pt. will transfer to toilet with minimal assist.   OT Short Term Goal 5 - Progress (Week 1): Not met Week 2:  OT Short Term Goal 1 (Week 2): Pt will complete stand pivot tub transfer with min assist  OT Short Term Goal 2 (Week 2): Pt will complete stand pivot toilet transfer with min assist  OT Short Term Goal 3 (Week 2):  Pt will use RUE as stabilizer during grooming task with 1 verbal cue OT Short Term Goal 4 (Week 2): Pt will complete LB dressing with close supervision for standing balance for management around waist  OT Short Term Goal 5 (Week 2): Pt will complete 2 grooming tasks in standing with close supervision  Skilled Therapeutic Interventions/Progress Updates:  Session Note: Time: 1415-1500 (45 mins) Pt with no report of pain.  Upon entering room, pt supine in bed. Pt->EOB then transferred->w/c using stand pivot to R side. From here pt donned socks/shoes with assist from OT. Pt able to demo use of dycem to assist with this task. From here pt propelled w/c->gym using LUE/LLE. Pt transferred<>therapy mat. On therapy mat skilled intervention focused on weight bearing activities including side-lying on R elbow, prone on elbows and quadruped position, bridging to increase independence with  bed mobility, and fxal transfers using squat and stand pivot techniques. OT issued elastic shoe laces to increase independence with LB dressing. OT pushed pt->room. At end of tx session, pt seated in w/c with call bell within reach.  Therapy Documentation Precautions:  Precautions Precautions: Fall Precaution Comments:  (decreased balance; right sided weakness) Restrictions Weight Bearing Restrictions: No  See FIM for current functional status  Therapy/Group: Individual Therapy  Roderic Palau 10/20/2012, 3:59 PM

## 2012-10-21 ENCOUNTER — Inpatient Hospital Stay (HOSPITAL_COMMUNITY): Payer: 59 | Admitting: Physical Therapy

## 2012-10-21 DIAGNOSIS — I6330 Cerebral infarction due to thrombosis of unspecified cerebral artery: Secondary | ICD-10-CM

## 2012-10-21 DIAGNOSIS — G8110 Spastic hemiplegia affecting unspecified side: Secondary | ICD-10-CM

## 2012-10-21 MED ORDER — ZOLPIDEM TARTRATE 5 MG PO TABS
5.0000 mg | ORAL_TABLET | Freq: Every day | ORAL | Status: DC
  Administered 2012-10-21 – 2012-10-28 (×8): 5 mg via ORAL
  Filled 2012-10-21 (×9): qty 1

## 2012-10-21 MED ORDER — BACLOFEN 10 MG PO TABS
10.0000 mg | ORAL_TABLET | Freq: Three times a day (TID) | ORAL | Status: DC
  Administered 2012-10-21 (×3): 10 mg via ORAL
  Filled 2012-10-21 (×8): qty 1

## 2012-10-21 NOTE — Progress Notes (Signed)
Physical Therapy Note  Patient Details  Name: IANMICHAEL AMESCUA MRN: [id number] Date of Birth: [birth year] Today's Date: 10/21/2012  1300-1355 (55 minutes) individual Pain: no complaint of pain  Focus of treatment: Therapeutic exercise focused on RT LE strengthening/control;   Treatment: wc mobility- 120 feet X 2 using Lt extremities SBA with min cues to attend to objects on right; transfers ; stand to squat/pivot min assist; Nustep Level 5 X 10 minutes LEs only; gait in parallel bars using ace wrap RT ankle forward/backwards X 2 min assist; gait 30 feet X 4 using RT PFRW min/mod assist with assist to steer AD and mod assist during direction changes for balance.; partial squats X 15 with bilateral UE support (back of wc).  Kimball Manske,JIM 10/21/2012, 1:12 PM

## 2012-10-21 NOTE — Progress Notes (Signed)
Patient rested for short intervals throughout the night due to muscle spasms to R lower extremity and general insomnia.  Patient states he does have a history of insomnia before CVA but it has gotten worse since CVA occurred.  Medicated throughout this shift as he requested and as ordered by MD.  Patient is requesting his Ambien 10mg  be restarted as he says Ambien is the one medication that helped him to rest the most.  Sticky note left for MD as well as passing information on to oncoming shift.    Kelli Hope M

## 2012-10-21 NOTE — Consult Note (Signed)
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  According to his medical record, he was admitted on 10/09/12 with right-sided numbness with RLE heaviness as well as lightheadedness.  MRI/MRA of his head revealed small infarct in the left posterior temporal white matter and posterior limb internal capsule.  He was started on ASA for thrombotic stroke.  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  Results from that evaluation revealed, "Mild indications that he could be suffering from memory loss of other cognitive difficulties, particularly in light of his high estimated premorbid intellectual functioning."  As such, it was recommended that he be scheduled to complete a more thorough neuropsychological screen.    PROCEDURES: [3 units of 16109 on 10/17/11]  The following tests were performed during today's visit: Repeatable Battery for the Assessment of Neuropsychological Status (RBANS, form A), Beck Depression Inventory (fast screen for medical patients).  Test results are as follows:   RBANS Indices Scaled Score Percentile Description  Immediate Memory  85 16 Below Average  Visuospatial/Constructional 109 73 Average  Language 97 42 Average  Attention n/a n/a n/a  Delayed Memory 96 34 Average  Total Score n/a n/a n/a   RBANS Subtests Raw Score Percentile Description  List Learning 20 5 Impaired  Story Memory 18 55 Average  Figure Copy 20 90 Above Average  Line Orientation 17 58 Average  Picture Naming 10 75 Average  Semantic Fluency 20 42 Average  Digit Span 11 58 Average  Coding n/a n/a n/a  List Recall 2 3 Impaired  List Recognition 20 70 Average  Story Recall 8 32 Average  Figure recall 11 21 Below Average    BDI-fast Raw Score = 1 Description = WNL   Test results revealed impairment in aspects of learning and memory.  Most notably, rote auditory learning and recall were impaired and he demonstrated reduced incidental memory.  These deficits are expected and consistent with the location of his stroke.  Although his other cognitive skills were largely average, many of these average scores also likely represent declines from his previous abilities, given his likely above average to superior baseline potential.  Of importance, one measure of processing speed requiring complex attention was not able to be administered due to his inability to use his dominant hand for writing. He used his non-dominant hand for untimed drawing tasks.  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  Although fatigue could certainly be exacerbating his cognitive deficits, it is not thought that fatigue can fully explain the impairments seen.    From an emotional standpoint, Dr. Mayford Knife' responses to a self-report measure of mood symptoms were not suggestive of clinically significant depression at this time.  However, his affect appeared flat and somewhat depressed.    In light of these findings, the following recommendations are provided.    RECOMMENDATIONS:  Recommendations for treatment team:     If at all possible, Dr. Mayford Knife' therapies should be scheduled starting no earlier than 10:00AM, given his reported difficulty rousing in the early hours of the morning.     Dr. Mayford Knife' memory was improved when he was told that he would need to remember something and when the information was  framed within a context.  As such, those interacting with him should explicitly state when they are providing him with instructions or information that he is expected to recall for later.  Also, they may find that he is better able to  freely recall recently learned information if it is framed within a context or story.     Dr. Mayford Knife may also benefit from being provided with multiple trials to learn new skills given the noted memory inefficiencies.    To the extent possible, multitasking should be avoided.   Performance will generally be best in a structured, routine, and familiar environment, as opposed to situations involving complex problems.   Recommendations for discharge planning:    Complete a comprehensive neuropsychological evaluation as an outpatient in 8-12 months to assess for interval change. Contact information for Drs. McDermott and Wylene Simmer should be provided for this purpose.     Maintain engagement in mentally, physically and cognitively stimulating activities.    Strive to maintain a healthy lifestyle (e.g., proper diet and exercise) in order to promote physical, cognitive and emotional health.         Leavy Cella, Psy.D.  Clinical Neuropsychologist

## 2012-10-21 NOTE — Progress Notes (Addendum)
Patient ID: Micheal Gonzalez, male   DOB: [birth year], 52 y.o.   MRN: [id number] Subjective/Complaints: 52 y.o. male admitted on 10/09/12 with right sided numbness with RLE Heaviness as well as lightheadedness. 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 2D echo with EF 55-60%. Carotid dopplers without ICA stenosis. 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 PT/OT evaluations pending. Patient with right hemiparesis with sensory deficits and difficulty with mobility--did have worsening of symptoms yesterday post BP meds. Neurology feels patient with progression and/or hypoperfusion. RUE remains flaccid  Poor sleep, Right biceps spasms, discussed and instructed stretch Frustrated by spasms  Review of Systems  Neurological:       Spasms RLE   Objective: Vital Signs: Blood pressure 129/83, pulse 75, temperature 98.6 F (37 C), temperature source Oral, resp. rate 20, height 6\' 1"  (1.854 m), weight 97.8 kg (215 lb 9.8 oz), SpO2 98.00%. No results found. Results for orders placed during the hospital encounter of 10/12/12 (from the past 72 hour(s))  CREATININE, SERUM     Status: Abnormal   Collection Time    10/19/12 12:04 AM      Result Value Range   Creatinine, Ser 1.26  0.50 - 1.35 mg/dL   GFR calc non Af Amer 64 (*) >90 mL/min   GFR calc Af Amer 74 (*) >90 mL/min   Comment:            The eGFR has been calculated     using the CKD EPI equation.     This calculation has not been     validated in all clinical     situations.     eGFR's persistently     <90 mL/min signify     possible Chronic Kidney Disease.    Constitutional: He is oriented to person, place, and time. He appears well-developed and well-nourished.  HENT: oral mucosa pink and moist  Head: Normocephalic and atraumatic.  Eyes: Pupils are equal, round, and reactive to  light.  Neck: Normal range of motion. Neck supple.  Cardiovascular: Normal rate and regular rhythm. No murmurs or gallops  No murmur heard.  Pulmonary/Chest: Effort normal and breath sounds normal. No respiratory distress.  Abdominal: Soft. Bowel sounds are normal. Non tender non distended Musculoskeletal: He exhibits no edema and no tenderness.  Neurological: He is alert and oriented to person, place, and time.  Mild right facial weakness with minimal dysarthria. Follows commands without difficulty. Right biceps Ashworth 3 Skin: Skin is warm and dry.  Psychiatric: He has a normal mood and affect. His behavior is normal. Thought content normal.  Right central 7 with dysarthric speech. Right pec, biceps 0 to tr/5. 0/5 with trice, wrist, HI, RLE is 3-/5 with HF and KE, 0/5 with ADF and APF. Diminished PP and LT in the right hand face. No resting tone. DTR's 2-3+ on Right side no clonus. Cognitively appropriate.    Assessment/Plan: 1. Functional deficits secondary to Right hemiparesis, L PLIC infarct which require 3+ hours per day of interdisciplinary therapy in a comprehensive inpatient rehab setting. Physiatrist is providing close team supervision and 24 hour management of active medical problems listed below. Physiatrist and rehab team continue to assess barriers to discharge/monitor patient progress toward functional and medical goals. FIM: FIM - Bathing Bathing Steps Patient Completed: Chest;Right lower leg (including foot);Left lower leg (including foot);Left Arm;Right Arm;Abdomen;Front perineal area;Buttocks;Right upper leg;Left upper leg  Bathing: 3: Mod-Patient completes 5-7 67f 10 parts or 50-74% (mod assist sit<>stand for peri care)  FIM - Upper Body Dressing/Undressing Upper body dressing/undressing steps patient completed: Thread/unthread right sleeve of pullover shirt/dresss;Thread/unthread left sleeve of pullover shirt/dress;Pull shirt over trunk;Put head through opening of pull  over shirt/dress Upper body dressing/undressing: 5: Set-up assist to: Obtain clothing/put away FIM - Lower Body Dressing/Undressing Lower body dressing/undressing steps patient completed: Thread/unthread right underwear leg;Thread/unthread left underwear leg;Pull underwear up/down;Thread/unthread right pants leg;Thread/unthread left pants leg;Pull pants up/down;Don/Doff left shoe Lower body dressing/undressing: 4: Min-Patient completed 75 plus % of tasks  FIM - Toileting Toileting steps completed by patient: Adjust clothing prior to toileting;Performs perineal hygiene;Adjust clothing after toileting Toileting Assistive Devices: Grab bar or rail for support Toileting: 4: Steadying assist  FIM - Diplomatic Services operational officer Devices: Grab bars Toilet Transfers: 3-From toilet/BSC: Mod A (lift or lower assist)  FIM - Banker Devices: Arm rests Bed/Chair Transfer: 4: Bed > Chair or W/C: Min A (steadying Pt. > 75%)  FIM - Locomotion: Wheelchair Distance: 150 Locomotion: Wheelchair: 5: Travels 150 ft or more: maneuvers on rugs and over door sills with supervision, cueing or coaxing FIM - Locomotion: Ambulation Locomotion: Ambulation Assistive Devices: Interior and spatial designer Ambulation/Gait Assistance: 1: +2 Total assist Locomotion: Ambulation: 1: Two helpers (43')  Comprehension Comprehension Mode: Auditory Comprehension: 6-Follows complex conversation/direction: With extra time/assistive device  Expression Expression Mode: Verbal Expression: 6-Expresses complex ideas: With extra time/assistive device  Social Interaction Social Interaction: 6-Interacts appropriately with others with medication or extra time (anti-anxiety, antidepressant).  Problem Solving Problem Solving: 6-Solves complex problems: With extra time  Memory Memory: 6-More than reasonable amt of time  Medical Problem List and Plan:  1. DVT Prophylaxis/Anticoagulation:  Pharmaceutical: Lovenox  2. Pain Management: N/A  3. Mood: Provide ego support. Mild underlying anxiety with recent progression. LCSW to follow for evaluation.  4. Neuropsych: This patient is capable of making decisions on his own behalf.  5. HTN: Will monitor with bid checks. Use meds prn SBP >180 or DBP>110. Allow BP to run a little high for now--is still elevated in a week to add medication per neuro.  6. RLS: Chronic , Will continue klonopin and Requip. he's had more problems sleeping since stroke.  7. Hyperlipidemia: On statin to get LDL <100.  8. Insomnia:Has spasms that interfere with sleep, developing increased biceps spasticity, does not seem to respond to Robaxin or tizanidine, will try baclofen -klonopin and ambien qhs   LOS (Days) 9 A FACE TO FACE EVALUATION WAS PERFORMED  Fraya Ueda E 10/21/2012, 10:29 AM

## 2012-10-22 ENCOUNTER — Inpatient Hospital Stay (HOSPITAL_COMMUNITY): Payer: 59 | Admitting: Occupational Therapy

## 2012-10-22 ENCOUNTER — Inpatient Hospital Stay (HOSPITAL_COMMUNITY): Payer: 59

## 2012-10-22 ENCOUNTER — Inpatient Hospital Stay (HOSPITAL_COMMUNITY): Payer: 59 | Admitting: Speech Pathology

## 2012-10-22 MED ORDER — BACLOFEN 10 MG PO TABS
10.0000 mg | ORAL_TABLET | Freq: Four times a day (QID) | ORAL | Status: DC
  Administered 2012-10-22 – 2012-10-26 (×15): 10 mg via ORAL
  Filled 2012-10-22 (×20): qty 1

## 2012-10-22 NOTE — Progress Notes (Signed)
Speech Language Pathology Daily Session Note  Patient Details  Name: Micheal Gonzalez MRN: [id number] Date of Birth: [birth year]  Today's Date: 10/22/2012 Time: 1130-1215 Time Calculation (min): 45 min  Short Term Goals: Week 2: SLP Short Term Goal 1 (Week 2): Patient will perform oral motor exercises with Modified Independence. SLP Short Term Goal 1 - Progress (Week 2): Progressing toward goal SLP Short Term Goal 2 (Week 2): Patient will identify compensatory strategies to increase intelligible speech Modified Independence. SLP Short Term Goal 2 - Progress (Week 2): Progressing toward goal SLP Short Term Goal 3 (Week 2): Patient will utlize compensatory strategies to increase intelligible speech at the conversation level with Supervision level cues from clinician or significant other.   SLP Short Term Goal 3 - Progress (Week 2): Progressing toward goal  Skilled Therapeutic Interventions: Session today focused on pt's job and communication demands.  We discussed communication needs/requirements based on audience, single vs group comm partners, phone vs face-to-face interactions, and the specific demands for each type of communication partner.  Practiced phone communication with service providers, requiring pt to make inquiries, ask and answer questions to unknown comm partner.  He modified speech output with min assist and then at Mod I level by end of session.  Feedback provided at conversational level to address clarity of consonant blends and avoid distortions.   FIM:  Comprehension Comprehension Mode: Auditory Comprehension: 7-Follows complex conversation/direction: With no assist Expression Expression Mode: Verbal Expression: 5-Expresses complex 90% of the time/cues < 10% of the time Social Interaction Social Interaction: 6-Interacts appropriately with others with medication or extra time (anti-anxiety, antidepressant). Problem Solving Problem Solving: 6-Solves complex problems:  With extra time Memory Memory: 7-Complete Independence: No helper  Pain Pain Assessment Pain Assessment: No/denies pain  Therapy/Group: Individual Therapy  Blenda Mounts Laurice 10/22/2012, 12:38 PM

## 2012-10-22 NOTE — Progress Notes (Signed)
Patient has slept well throughout the night.  Awakened x1 to request Tylenol at 0218 for R leg pain.    Micheal Gonzalez

## 2012-10-22 NOTE — Progress Notes (Addendum)
Physical Therapy Session Note  Patient Details  Name: Micheal Gonzalez MRN: [id number] Date of Birth: April 13, 1961  Today's Date: 10/22/2012 Time: 1000-1045 Time Calculation (min): 45 min  Short Term Goals: STG:   1.Pt will transfer L with close supervision 2. Pt will move supine> sit through L sidelying with supervision 3. Pt will propel w/c x 150' including thresholds, congested spaces modified independent 4. Pt will perform gait x 40' with assistance of 1 person, LRAD. 5. Pt will ascend/descend 4 steps with 2 rails, min assist.   Skilled Therapeutic Interventions/Progress Updates:  OT had positioned pt in supine with Rue supported at end of her session. Pt fell asleep and was snoring within minutes.  Pt awoke easily.  Treatment focused on neuromuscular re-education via tactile and manual cues, VCs, demo for movements in supine, sitting, standing, and gait.  2 sets of 10 lower trunk rotation, bil bridging, RLE heel slide; all with tactile or manual cues for technique. Sit>< stand without use of UEs, x 3, with manual cues for R hip neutral rotation, wt bearing RLE,   Bed mobility training in R sidelying: 1 set of 10 bil hip flexion/ext and 1 set of 10 knee ext/flexion as components of sit>< supine.  Gait with LPFRW x 57' with ACE RLE and R hand, +2 assist to progress RW, widen BOS, facilitate wt shift to R.  W/c propulsion x 150' with distant supervision.    Therapy Documentation Precautions:  Precautions Precautions: Fall Precaution Comments:  (decreased balance; right sided weakness) Restrictions Weight Bearing Restrictions: No   Pain: Pain Assessment Pain Assessment: No/denies pain   Locomotion : Ambulation Ambulation/Gait Assistance: 1: +2 Total assist Wheelchair Mobility Distance: 150    Other Treatments: Treatments Neuromuscular Facilitation: Right;Forced use;Activity to increase sustained activation;Lower Extremity;Activity to increase motor control;Activity  to increase timing and sequencing;Activity to increase anterior-posterior weight shifting;Activity to increase lateral weight shifting  See FIM for current functional status  Therapy/Group: Individual Therapy  [name] 10/22/2012, 12:17 PM

## 2012-10-22 NOTE — Progress Notes (Signed)
Occupational Therapy Session Note  Patient Details  Name: Micheal Gonzalez MRN: [id number] Date of Birth: March 09, 1961  Today's Date: 10/22/2012 Time: 0800-0945 Time Calculation (min): 105 min  Short Term Goals: Week 1:  OT Short Term Goal 1 (Week 1): Pt. will groom self at sink in standing with minimal assist OT Short Term Goal 1 - Progress (Week 1): Met OT Short Term Goal 2 (Week 1): Pt. will use RUE as stabilizer with mod assist. OT Short Term Goal 2 - Progress (Week 1): Met OT Short Term Goal 3 (Week 1): Pt. transfer to tub/shower with minimal assist OT Short Term Goal 3 - Progress (Week 1): Met OT Short Term Goal 4 (Week 1): Pt. will dress self with minimal assist OT Short Term Goal 4 - Progress (Week 1): Met OT Short Term Goal 5 (Week 1): Pt. will transfer to toilet with minimal assist.   OT Short Term Goal 5 - Progress (Week 1): Not met Week 2:  OT Short Term Goal 1 (Week 2): Pt will complete stand pivot tub transfer with min assist  OT Short Term Goal 2 (Week 2): Pt will complete stand pivot toilet transfer with min assist  OT Short Term Goal 3 (Week 2):  Pt will use RUE as stabilizer during grooming task with 1 verbal cue OT Short Term Goal 4 (Week 2): Pt will complete LB dressing with close supervision for standing balance for management around waist  OT Short Term Goal 5 (Week 2): Pt will complete 2 grooming tasks in standing with close supervision  Skilled Therapeutic Interventions/Progress Updates:      Pt seen for BADL retraining of toileting, bathing, and dressing with a focus on squat pivot transfers to increase safety with transfers, sit to stand and standing balance, adaptive techniques and use of AE for bathing.  Pt completed toileting and bathing needing min assist with transfer to stay forward into a lean.  He used long sponge to bathe and stood holding onto bar for the OT to wash his buttocks.  To don pants, stood at sink with RUE weight bearing on sink while using LUE  to pull pants up. In gym, pt received 10 min of NMES to right wrist and finger extensors at level 20 with 35 pps, 10 sec on and 10 off. He then used UE ranger for increased shoulder control with flex/ext and wrist ext.  Minimal active movement.  Standing and RUE weight bearing with a focus on RLE extension while playing a game of checkers. Pt then worked on lateral squat pivot transfers. Verbal cues to right but min to left to guide trunk forward flexion. In supine, elbow a/arom and PNF D1 and D2 patterns for increased shoulder control. Pt continued to rest in supine on mat for 15 min until his PT session began.  Therapy Documentation Precautions:  Precautions Precautions: Fall Precaution Comments:  (decreased balance; right sided weakness) Restrictions Weight Bearing Restrictions: No  Pain: Pain Assessment Pain Assessment: No/denies pain ADL:  See FIM for current functional status  Therapy/Group: Individual Therapy  Micheal Gonzalez 10/22/2012, 9:56 AM

## 2012-10-23 ENCOUNTER — Inpatient Hospital Stay (HOSPITAL_COMMUNITY): Payer: 59 | Admitting: Occupational Therapy

## 2012-10-23 ENCOUNTER — Inpatient Hospital Stay (HOSPITAL_COMMUNITY): Payer: 59 | Admitting: *Deleted

## 2012-10-23 DIAGNOSIS — G8110 Spastic hemiplegia affecting unspecified side: Secondary | ICD-10-CM

## 2012-10-23 DIAGNOSIS — I6330 Cerebral infarction due to thrombosis of unspecified cerebral artery: Secondary | ICD-10-CM

## 2012-10-23 NOTE — Plan of Care (Signed)
Problem: RH SKIN INTEGRITY Goal: RH STG SKIN FREE OF INFECTION/BREAKDOWN Patient will remain free from skin breakdown /infection while on Rehab with min assist of caregiver  Outcome: Not Applicable Date Met:  10/23/12 Skin CDI.

## 2012-10-23 NOTE — Progress Notes (Signed)
Patient ID: Micheal Gonzalez, male   DOB: Aug 26, 1960, 52 y.o.   MRN: [id number] Subjective/Complaints: 52 y.o. male admitted on 10/09/12 with right sided numbness with RLE Heaviness as well as lightheadedness. 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 2D echo with EF 55-60%. Carotid dopplers without ICA stenosis. 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 PT/OT evaluations pending. Patient with right hemiparesis with sensory deficits and difficulty with mobility--did have worsening of symptoms yesterday post BP meds. Neurology feels patient with progression and/or hypoperfusion. RUE remains flaccid  Slept much better on Baclofen Q6 h dosing  Review of Systems  Neurological:       Spasms RLE   Objective: Vital Signs: Blood pressure 132/81, pulse 61, temperature 98 F (36.7 C), temperature source Oral, resp. rate 19, height 6\' 1"  (1.854 m), weight 97.8 kg (215 lb 9.8 oz), SpO2 97.00%. No results found. No results found for this or any previous visit (from the past 72 hour(s)).  Constitutional: He is oriented to person, place, and time. He appears well-developed and well-nourished.  HENT: oral mucosa pink and moist  Head: Normocephalic and atraumatic.  Eyes: Pupils are equal, round, and reactive to light.  Neck: Normal range of motion. Neck supple.  Cardiovascular: Normal rate and regular rhythm. No murmurs or gallops  No murmur heard.  Pulmonary/Chest: Effort normal and breath sounds normal. No respiratory distress.  Abdominal: Soft. Bowel sounds are normal. Non tender non distended Musculoskeletal: He exhibits no edema and no tenderness.  Neurological: He is alert and oriented to person, place, and time.  Mild right facial weakness with minimal dysarthria. Follows commands without difficulty. Right biceps Ashworth 3 Skin: Skin is  warm and dry.  Psychiatric: He has a normal mood and affect. His behavior is normal. Thought content normal.  Right central 7 with dysarthric speech. Right pec, biceps 0 to tr/5. 0/5 with trice, wrist, HI, RLE is 3-/5 with HF and KE, 0/5 with ADF and APF.Marland Kitchen No resting tone. DTR's 2-3+ on Right side no clonus. Cognitively appropriate.    Assessment/Plan: 1. Functional deficits secondary to Right hemiparesis, L PLIC infarct which require 3+ hours per day of interdisciplinary therapy in a comprehensive inpatient rehab setting. Physiatrist is providing close team supervision and 24 hour management of active medical problems listed below. Physiatrist and rehab team continue to assess barriers to discharge/monitor patient progress toward functional and medical goals. FIM: FIM - Bathing Bathing Steps Patient Completed: Chest;Right Arm;Left Arm;Abdomen;Front perineal area;Right upper leg;Left upper leg;Left lower leg (including foot);Right lower leg (including foot) Bathing: 4: Min-Patient completes 8-9 67f 10 parts or 75+ percent  FIM - Upper Body Dressing/Undressing Upper body dressing/undressing steps patient completed: Thread/unthread right sleeve of pullover shirt/dresss;Thread/unthread left sleeve of pullover shirt/dress;Pull shirt over trunk;Put head through opening of pull over shirt/dress Upper body dressing/undressing: 5: Set-up assist to: Obtain clothing/put away FIM - Lower Body Dressing/Undressing Lower body dressing/undressing steps patient completed: Thread/unthread right underwear leg;Thread/unthread left underwear leg;Pull underwear up/down;Thread/unthread right pants leg;Thread/unthread left pants leg;Pull pants up/down;Don/Doff left shoe Lower body dressing/undressing: 4: Min-Patient completed 75 plus % of tasks  FIM - Toileting Toileting steps completed by patient: Adjust clothing prior to toileting;Performs perineal hygiene;Adjust clothing after toileting Toileting Assistive  Devices: Grab bar or rail for support Toileting: 4: Steadying assist  FIM - Diplomatic Services operational officer Devices: Grab bars Toilet Transfers: 4-To toilet/BSC: Min A (  steadying Pt. > 75%);4-From toilet/BSC: Min A (steadying Pt. > 75%)  FIM - Bed/Chair Transfer Bed/Chair Transfer Assistive Devices: Arm rests Bed/Chair Transfer: 4: Supine > Sit: Min A (steadying Pt. > 75%/lift 1 leg);4: Chair or W/C > Bed: Min A (steadying Pt. > 75%);4: Bed > Chair or W/C: Min A (steadying Pt. > 75%)  FIM - Locomotion: Wheelchair Distance: 150 Locomotion: Wheelchair: 5: Travels 150 ft or more: maneuvers on rugs and over door sills with supervision, cueing or coaxing FIM - Locomotion: Ambulation Locomotion: Ambulation Assistive Devices: TEFL teacher;Walker - Rolling Ambulation/Gait Assistance: 1: +2 Total assist Locomotion: Ambulation: 1: Two helpers (75')  Comprehension Comprehension Mode: Auditory Comprehension: 7-Follows complex conversation/direction: With no assist  Expression Expression Mode: Verbal Expression: 6-Expresses complex ideas: With extra time/assistive device  Social Interaction Social Interaction: 6-Interacts appropriately with others with medication or extra time (anti-anxiety, antidepressant).  Problem Solving Problem Solving: 7-Solves complex problems: Recognizes & self-corrects  Memory Memory: 7-Complete Independence: No helper  Medical Problem List and Plan:  1. DVT Prophylaxis/Anticoagulation: Pharmaceutical: Lovenox  2. Pain Management: N/A  3. Mood: Provide ego support. Mild underlying anxiety with recent progression. LCSW to follow for evaluation.  4. Neuropsych: This patient is capable of making decisions on his own behalf.  5. HTN: Will monitor with bid checks. Use meds prn SBP >180 or DBP>110. Allow BP to run a little high for now--is still elevated in a week to add medication per neuro.  6. RLS: Chronic , Will continue klonopin and Requip. he's  had more problems sleeping since stroke.  7. Hyperlipidemia: On statin to get LDL <100.  8. Insomnia:Has spasms that interfere with sleep, developing increased biceps spasticity, does not seem to respond to Robaxin or tizanidine, Cont baclofen 10mg  Q 6 h -klonopin and ambien qhs   LOS (Days) 11 A FACE TO FACE EVALUATION WAS PERFORMED  Amanada Philbrick E 10/23/2012, 8:53 AM

## 2012-10-23 NOTE — Plan of Care (Signed)
Problem: RH BLADDER ELIMINATION Goal: RH STG MANAGE BLADDER WITH ASSISTANCE STG Manage Bladder With Modified independence  Outcome: Progressing No incontinent episode reported

## 2012-10-23 NOTE — Progress Notes (Addendum)
Recreational Therapy Session Note  Patient Details  Name: Micheal Gonzalez MRN: [id number] Date of Birth: 1960-09-11 Today's Date: 10/23/2012 Time:  830-930 Pain: no c/o Skilled Therapeutic Interventions/Progress Updates: Follow up with pt about sleep.  Pt stated that he did sleep last night.  Discussion with pt about discharge planning.  Pt states he still thinks discharge date is too long and would like to discharge by July 4 or 5 and stated he did share his desire to discharge earlier than anticipated date with MD this morning.  Session focused on w/c mobility on unit with mod I negotiating obstacles.  Assisted PT with ambulation using PFRW initially and then pt progressed to assist of 1.  No further TR applicable at this time.  Will continue to monitor through team.  Therapy/Group: Co-Treatment Tamakia Porto 10/23/2012, 4:33 PM

## 2012-10-23 NOTE — Progress Notes (Signed)
Occupational Therapy Session Note  Patient Details  Name: MARVIE CALENDER MRN: [id number] Date of Birth: 05-07-1960  Today's Date: 10/23/2012 Time: 0930-1030 and 1110-1140 Time Calculation (min): 60 min and 30 min  Short Term Goals: Week 1:  OT Short Term Goal 1 (Week 1): Pt. will groom self at sink in standing with minimal assist OT Short Term Goal 1 - Progress (Week 1): Met OT Short Term Goal 2 (Week 1): Pt. will use RUE as stabilizer with mod assist. OT Short Term Goal 2 - Progress (Week 1): Met OT Short Term Goal 3 (Week 1): Pt. transfer to tub/shower with minimal assist OT Short Term Goal 3 - Progress (Week 1): Met OT Short Term Goal 4 (Week 1): Pt. will dress self with minimal assist OT Short Term Goal 4 - Progress (Week 1): Met OT Short Term Goal 5 (Week 1): Pt. will transfer to toilet with minimal assist.   OT Short Term Goal 5 - Progress (Week 1): Not met Week 2:  OT Short Term Goal 1 (Week 2): Pt will complete stand pivot tub transfer with min assist  OT Short Term Goal 2 (Week 2): Pt will complete stand pivot toilet transfer with min assist  OT Short Term Goal 3 (Week 2):  Pt will use RUE as stabilizer during grooming task with 1 verbal cue OT Short Term Goal 4 (Week 2): Pt will complete LB dressing with close supervision for standing balance for management around waist  OT Short Term Goal 5 (Week 2): Pt will complete 2 grooming tasks in standing with close supervision  Skilled Therapeutic Interventions/Progress Updates:    Visit 1:  Pt seen for BADL retraining of B/D at shower level with a focus on sit to stand and static standing balance.  Pt was able to stand with guiding assist and maintain static standing with gentle steady assist to stand in shower to wash bottom and to stand 2x to pull up underwear and pants.  This is a great deal of progress from yesterday in which he needed mod assist to stabililize his RLE.  Pt is now able to lift RLE from sitting to don pants over  right foot without having to cross foot.  Pt then completed stand pivot transfer to recliner with min assist for his NMES treatment. Estim to wrist extensors 10 min with 35 pps with 10 sec on and off with intensity of 20.  Pt used UE ranger for A/Arom for RUE to facilitate shoulder flex and ext, horizontal abd, add.  Pt resting in recliner at end of session.  Visit 2: Pt seen this session for neuro re-ed to include standing balance, sit><stand from low surface, RUE weight bearing and A/arom activities. Pt has been making excellent progress with his standing balance as he now can stand with occasional steady assist without any UE support to allow him to use LUE for functional activities.  In standing he worked on scapular retraction exercises. Pt resting in recliner at end of session.  Therapy Documentation Precautions:  Precautions Precautions: Fall Precaution Comments:  (decreased balance; right sided weakness) Restrictions Weight Bearing Restrictions: No  See FIM for current functional status  Therapy/Group: Individual Therapy  Folashade Gamboa 10/23/2012, 11:48 AM

## 2012-10-23 NOTE — Progress Notes (Signed)
Physical Therapy Session Note  Patient Details  Name: Micheal Gonzalez MRN: [id number] Date of Birth: 04/15/1961  Today's Date: 10/23/2012 Time: 0832-0930 and 13:30-14:15  Time Calculation (min): 58 min and ( )   Skilled Therapeutic Interventions/Progress Updates:    First treatment [place] with rec therapy, focusing on WC mobilty on the unit, gait training, and transfer training.   Recliner>WC transfer with min A for safely completing turn, no cues needed.   Pt demonstrated ability to perform WC mobility on unit with Mod I and no safety concerns in busy environments or with WC mangement. Discussed with pt the need to have staff there for any transfers to/from [facility], but otherwise will be Mod I on unit by end of day. Pt demonstrated short distance ability to propel WC with bil LEs only for increased R LE coordination.   Sit<>stand transfers x4 throughout tx with cues for hand placement on WC each time as well as to use RLE for lifting and lowering.   Gait training 1x110' with PFRW and 2x50' with L handrail in hall with Mod A overall. Began walk with +2 for RW management safety, but dropped to 1 person quickly. Pt responding well to demonstration and cues for tightening R quad and hip extensors in stance. Pt needing verbal and tactile cues for hip/knee ext. Therapist also walking with RLE between feet to guide RLE to neutral position each step. Pt needing cues to take equal steps, esp increase L step length, which was more successful with hand rail so RW not preventing forward translation.   Second treatment Focused on NMR for RLE, transfer training, and gait training in controlled environment at handrail.  Supine>sit with S WC<>bed and WC<>mat transfers with Min A and cues for more efficient and safe foot placement.  Pt propelled WC x150' in controlled environment, Mod I with no assist needed for parts.  Sit>supine on mat with S   NMR for RLE activation with tactile and verbal cues for:   Sidelying clamshells 3x10 Supine bridging 3x10 RLE hip ABD in supine 1x10 - stopped due to flexor compensation   Pre-gait and NMR in //bars  - Forward/retro stepping with RLE to target RLE placement on outside of red line 2x10 with NMR for R knee and hip ext with weight-shift - Forward/retro stepping with LLE to increase R weight-bearing and L step length Standing hip ABD in limited ROM due to decreased strength 2x10  Pt requesting to go back to bed at end of tx.  Dynamic sitting balance reaching for personal objects with close S. Pt educated on importance of equal weight-bearing through hips and UEs in sitting as well.   Gait in hall with R rail x50' with Max A due to fatigue. Pt still having difficulty with RLE placement.    Therapy Documentation Precautions:  Precautions Precautions: Fall Precaution Comments:  (decreased balance; right sided weakness) Restrictions Weight Bearing Restrictions: No   Vital Signs:   Pain: None    Locomotion : Ambulation Ambulation/Gait Assistance: 3: Mod assist Wheelchair Mobility Distance: 150   See FIM for current functional status  Therapy/Group: Individual Therapy  Clydene Laming, PT, DPT   10/23/2012, 10:01 AM

## 2012-10-23 NOTE — Plan of Care (Signed)
Problem: RH SAFETY Goal: RH STG ADHERE TO SAFETY PRECAUTIONS W/ASSISTANCE/DEVICE STG Adhere to Safety Precautions With min assist and freq reminders to call for assistance and not reach for items out of reach  Outcome: Progressing Calls appropriately.

## 2012-10-24 ENCOUNTER — Inpatient Hospital Stay (HOSPITAL_COMMUNITY): Payer: 59 | Admitting: Occupational Therapy

## 2012-10-24 ENCOUNTER — Inpatient Hospital Stay (HOSPITAL_COMMUNITY): Payer: 59 | Admitting: Physical Therapy

## 2012-10-24 ENCOUNTER — Inpatient Hospital Stay (HOSPITAL_COMMUNITY): Payer: 59

## 2012-10-24 NOTE — Progress Notes (Signed)
Social Work Patient ID: Micheal Gonzalez, male   DOB: [birth year], 53 y.o.   MRN: [id number] Met with pt to inform him of team conference progression toward goals and discharge.  Pt would like to go home 7/5 and feels he can Make the progress he is making there along with Op therapies.  MD reports no medical issues that would hold him here.  Caroline-PT wants to speak With pt regarding staying into next week, due to the progress he is making.  Aware would need to do family education with Jamila prior to discharge. Pt is agreeable to this.  See what he and [place] Citizen -PT decide.  His spasms are much better with baclofen on board.  Continue to work toward discharge.

## 2012-10-24 NOTE — Progress Notes (Signed)
Physical Therapy Session Note  Patient Details  Name: Micheal Gonzalez MRN: [id number] Date of Birth: 05-Dec-1960  Today's Date: 10/24/2012 [id number] and  1430-1530 Time Calculation (min):45 and  60 min  Short Term Goals: Week 2:  PT Short Term Goal 1 (Week 2): pt will transfer to L with close supervision PT Short Term Goal 2 (Week 2): pt will move supine to sit through L sidelying with supervision PT Short Term Goal 3 (Week 2): pt will propel w/c x 150' including threshold, negotiating congested spaces, modified independent PT Short Term Goal 4 (Week 2): pt will perform gait x 42' with assistance of 1 person, LRAD PT Short Term Goal 5 (Week 2): pt will ascend/descend 4 steps, 2 rails with min assist     Skilled Therapeutic Interventions/Progress Updates:   Treatment 1 focused on neuromuscular re-education via tactile and manual cues, VCs for RLE stance stability avoiding hyperextension; RUE gross assist motor task in standing, transfers.  Bed> w/c to R stand pivot with min assist, mod VCs to incorporate RLE into task.  Mat> w/c to L stand pivot to L with min assist, mod VCs as above.  Kinetron at 10cm/sec in high sitting, focusing on RLE extension, x 10 minutes.  Standing during LUE or bil UE activity working on R knee control; feet level, progressing to LLE on 2" high stool to force more wt onto RLE.  W/c mobility on unit modified independent.  Treatment 2 with Clinical Specialist, focused on NMES/FES of R hip abductors to facilitate safe R foot placement with wider BOS during gait; neuromuscular re-education via manual cues, VCs.   Gait with RPFRW x 55' with max assist for RLE swing control for foot placement, R stance stability, upright stance, forward gaze.  Gait with  PFRW x 20' with theraband on lower section of RW to abduct LE during swing phase.  Attempted R LE closed chain hip abduction in standing, but pt unable to activate.    Therapy Documentation Precautions:   Precautions Precautions: Fall Precaution Comments:  (decreased balance; right sided weakness) Pain:- none reported     Other Treatments: Treatments Neuromuscular Facilitation: Right;Forced use;Activity to increase sustained activation;Lower Extremity;Activity to increase motor control;Activity to increase timing and sequencing;Activity to increase anterior-posterior weight shifting;Activity to increase lateral weight shifting Modalities Modalities: Archivist Stimulation Location: R hip abductors Electrical Stimulation Action: abduction during swing phase Electrical Stimulation Parameters: 50 pps, intensity 28-34, 16 seconds on/10 seconds off, for 5 minutes, multiple repetitions Electrical Stimulation Goals: Neuromuscular facilitation  See FIM for current functional status  Therapy/Group: Individual Therapy and Co-Treatment  [name] 10/24/2012, 4:47 PM

## 2012-10-24 NOTE — Progress Notes (Signed)
Patient ID: Micheal Gonzalez, male   DOB: [birth year], 52 y.o.   MRN: [id number] Subjective/Complaints: 52 y.o. male admitted on 10/09/12 with right sided numbness with RLE Heaviness as well as lightheadedness. 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 2D echo with EF 55-60%. Carotid dopplers without ICA stenosis. 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 PT/OT evaluations pending. Patient with right hemiparesis with sensory deficits and difficulty with mobility--did have worsening of symptoms yesterday post BP meds. Neurology feels patient with progression and/or hypoperfusion. RUE remains flaccid  Slept much better on Baclofen Q6 h dosing  No new issues, would like to d/C on 7/5 Review of Systems  Neurological:       Spasms RLE   Objective: Vital Signs: Blood pressure 131/86, pulse 51, temperature 97.6 F (36.4 C), temperature source Oral, resp. rate 17, height 6\' 1"  (1.854 m), weight 97.8 kg (215 lb 9.8 oz), SpO2 95.00%. No results found. No results found for this or any previous visit (from the past 72 hour(s)).  Constitutional: He is oriented to person, place, and time. He appears well-developed and well-nourished.  HENT: oral mucosa pink and moist  Head: Normocephalic and atraumatic.  Eyes: Pupils are equal, round, and reactive to light.  Neck: Normal range of motion. Neck supple.  Cardiovascular: Normal rate and regular rhythm. No murmurs or gallops  No murmur heard.  Pulmonary/Chest: Effort normal and breath sounds normal. No respiratory distress.  Abdominal: Soft. Bowel sounds are normal. Non tender non distended Musculoskeletal: He exhibits no edema and no tenderness.  Neurological: He is alert and oriented to person, place, and time.  Mild right facial weakness with minimal dysarthria. Follows commands without  difficulty. Right biceps [place] 2 Skin: Skin is warm and dry.  Psychiatric: He has a normal mood and affect. His behavior is normal. Thought content normal.  Right central 7 with dysarthric speech. Right pec, biceps 0 to tr/5. 0/5 with trice, wrist, HI, RLE is 3-/5 with HF and KE, 0/5 with ADF and APF.Marland Kitchen No resting tone. DTR's 2-3+ on Right side no clonus. Cognitively appropriate.    Assessment/Plan: 1. Functional deficits secondary to Right hemiparesis, L PLIC infarct which require 3+ hours per day of interdisciplinary therapy in a comprehensive inpatient rehab setting. Physiatrist is providing close team supervision and 24 hour management of active medical problems listed below. Physiatrist and rehab team continue to assess barriers to discharge/monitor patient progress toward functional and medical goals. Team conference today please see physician documentation under team conference tab, met with team face-to-face to discuss problems,progress, and goals. Formulized individual treatment plan based on medical history, underlying problem and comorbidities. FIM: FIM - Bathing Bathing Steps Patient Completed: Chest;Right Arm;Left Arm;Abdomen;Front perineal area;Buttocks;Right upper leg;Left upper leg;Right lower leg (including foot);Left lower leg (including foot) Bathing: 4: Steadying assist  FIM - Upper Body Dressing/Undressing Upper body dressing/undressing steps patient completed: Thread/unthread right sleeve of pullover shirt/dresss;Thread/unthread left sleeve of pullover shirt/dress;Pull shirt over trunk;Put head through opening of pull over shirt/dress Upper body dressing/undressing: 5: Set-up assist to: Obtain clothing/put away FIM - Lower Body Dressing/Undressing Lower body dressing/undressing steps patient completed: Thread/unthread right underwear leg;Thread/unthread left underwear leg;Pull underwear up/down;Thread/unthread right pants leg;Thread/unthread left pants leg;Pull pants  up/down;Don/Doff right sock;Don/Doff left sock;Don/Doff left shoe Lower body dressing/undressing: 4: Min-Patient completed 75 plus % of tasks  FIM - Toileting Toileting steps completed by patient: Adjust clothing  prior to toileting;Performs perineal hygiene;Adjust clothing after toileting Toileting Assistive Devices: Grab bar or rail for support Toileting: 4: Steadying assist  FIM - Diplomatic Services operational officer Devices: Grab bars Toilet Transfers: 4-To toilet/BSC: Min A (steadying Pt. > 75%);4-From toilet/BSC: Min A (steadying Pt. > 75%)  FIM - Bed/Chair Transfer Bed/Chair Transfer Assistive Devices: Bed rails;Arm rests Bed/Chair Transfer: 4: Bed > Chair or W/C: Min A (steadying Pt. > 75%)  FIM - Locomotion: Wheelchair Distance: 150 Locomotion: Wheelchair: 6: Travels 150 ft or more, turns around, maneuvers to table, bed or toilet, negotiates 3% grade: maneuvers on rugs and over door sills independently FIM - Locomotion: Ambulation Locomotion: Ambulation Assistive Devices: Other (comment) (Handrail ) Ambulation/Gait Assistance: 2: Max assist Locomotion: Ambulation: 2: Travels 50 - 149 ft with maximal assistance (Pt: 25 - 49%)  Comprehension Comprehension Mode: Auditory Comprehension: 7-Follows complex conversation/direction: With no assist  Expression Expression Mode: Verbal Expression: 6-Expresses complex ideas: With extra time/assistive device  Social Interaction Social Interaction: 6-Interacts appropriately with others with medication or extra time (anti-anxiety, antidepressant).  Problem Solving Problem Solving: 7-Solves complex problems: Recognizes & self-corrects  Memory Memory: 7-Complete Independence: No helper  Medical Problem List and Plan:  1. DVT Prophylaxis/Anticoagulation: Pharmaceutical: Lovenox  2. Pain Management: N/A  3. Mood: Provide ego support. Mild underlying anxiety with recent progression. LCSW to follow for evaluation.  4.  Neuropsych: This patient is capable of making decisions on his own behalf.  5. HTN: Will monitor with bid checks. Use meds prn SBP >180 or DBP>110. Allow BP to run a little high for now--is still elevated in a week to add medication per neuro.  6. RLS: Chronic , Will continue klonopin and Requip. he's had more problems sleeping since stroke.  7. Hyperlipidemia: On statin to get LDL <100.  8. Insomnia:Has spasms that interfere with sleep, developing increased biceps spasticity, does not seem to respond to Robaxin or tizanidine, Cont baclofen 10mg  Q 6 h -klonopin and ambien qhs   LOS (Days) 12 A FACE TO FACE EVALUATION WAS PERFORMED  KIRSTEINS,ANDREW E 10/24/2012, 9:54 AM

## 2012-10-24 NOTE — Progress Notes (Signed)
Occupational Therapy Session Note  Patient Details  Name: Micheal Gonzalez MRN: [id number] Date of Birth: 05/22/1960  Today's Date: 10/24/2012 Time: 1030-1130 Time Calculation (min): 60 min  Short Term Goals: Week 1:  OT Short Term Goal 1 (Week 1): Pt. will groom self at sink in standing with minimal assist OT Short Term Goal 1 - Progress (Week 1): Met OT Short Term Goal 2 (Week 1): Pt. will use RUE as stabilizer with mod assist. OT Short Term Goal 2 - Progress (Week 1): Met OT Short Term Goal 3 (Week 1): Pt. transfer to tub/shower with minimal assist OT Short Term Goal 3 - Progress (Week 1): Met OT Short Term Goal 4 (Week 1): Pt. will dress self with minimal assist OT Short Term Goal 4 - Progress (Week 1): Met OT Short Term Goal 5 (Week 1): Pt. will transfer to toilet with minimal assist.   OT Short Term Goal 5 - Progress (Week 1): Not met Week 2:  OT Short Term Goal 1 (Week 2): Pt will complete stand pivot tub transfer with min assist  OT Short Term Goal 2 (Week 2): Pt will complete stand pivot toilet transfer with min assist  OT Short Term Goal 3 (Week 2):  Pt will use RUE as stabilizer during grooming task with 1 verbal cue OT Short Term Goal 4 (Week 2): Pt will complete LB dressing with close supervision for standing balance for management around waist  OT Short Term Goal 5 (Week 2): Pt will complete 2 grooming tasks in standing with close supervision  Skilled Therapeutic Interventions/Progress Updates:      Pt seen for BADL retraining of  bathing and dressing with a focus on sit to stand and standing balance.  Pt ambulated to shower with platform walker with mod assist to facilitate RLE stepping.   After shower transferred to w/c with supervision.  He was able to stand with steady assist in shower and in room to complete dressing.  He worked on [employer] A/Arom with table top pillow slides to facilitate shoulder flexion and extension and elbow flexion.  Pt then used platform walker to  ambulate to recliner. Pt resting in recliner at end of session.  Therapy Documentation Precautions:  Precautions Precautions: Fall Precaution Comments:  (decreased balance; right sided weakness) Restrictions Weight Bearing Restrictions: No   Pain: Pain Assessment Pain Assessment: No/denies pain ADL:   See FIM for current functional status  Therapy/Group: Individual Therapy  SAGUIER,JULIA 10/24/2012, 12:23 PM

## 2012-10-24 NOTE — Patient Care Conference (Signed)
Inpatient RehabilitationTeam Conference and Plan of Care Update Date: 10/24/2012   Time: 11:20AM    Patient Name: Micheal Gonzalez      Medical Record Number: [id number]  Date of Birth: [birth year] Sex: Male         Room/Bed: 4149/4149-01 Payor Info: Payor: Advertising copywriter / Plan: Advertising copywriter / Product Type: *No Product type* /    Admitting Diagnosis: L CVA  Admit Date/Time:  10/12/2012  3:22 PM Admission Comments: No comment available   Primary Diagnosis:  CVA (cerebral infarction) Principal Problem: CVA (cerebral infarction)  Patient Active Problem List   Diagnosis Date Noted  . CVA (cerebral infarction) 10/11/2012  . Unsteady gait 10/09/2012  . HTN (hypertension) 05/31/2012  . Family history of CVA 05/17/2012  . History of renal stone 05/17/2012  . Allergic rhinitis, mild 05/17/2012  . RLS (restless legs syndrome) 01/06/2011  . Allergic rhinitis due to pollen 01/06/2011  . ED (erectile dysfunction) 01/06/2011  . Family history of colon cancer 01/06/2011  . Kidney stones 01/06/2011  . Family history of heart disease in male family member before age 2 01/06/2011    Expected Discharge Date: Expected Discharge Date: 11/01/12  Team Members Present: Physician leading conference: Dr. Claudette Laws Social Worker Present: Dossie Der, LCSW Nurse Present: Other (comment) Milly Jakob Rcom-RN) PT Present: Edman Circle, PT;Caroline Adriana Simas, PT;Other (comment) Clarisse Gouge Ripa-PT) OT Present: Other (comment);Leonette Monarch, Felipa Eth, OT (Kayla Perkinson-OT) SLP Present: Feliberto Gottron, SLP Other (Discipline and Name): marie Noel-PPS     Current Status/Progress Goal Weekly Team Focus  Medical   Severe spasticity and severe hemiparesis  Improve spasticity and sleep  Medication adjustment   Bowel/Bladder   cont of bowel and bladder. no meds. uses toilet  remain cont of bowel and bladder  monitor for constipation/diarrhea   Swallow/Nutrition/ Hydration     na         ADL's   steady assist with bathing and toileting, supervision UB dressing, min assist LB dressing, min stand pivot transfers, supervision squat pivot transfers  supervision overall  ADL retraining, NMES, neuro re-ed, functional mobility, balance activities, pt/ caregiver education   Mobility   Min A transfers, Mod I WC on unit, +1/2 gait with PFRW x100'  S for transfers and WC mobility, Min A gait x50'  RLE NMR (esp ext in stance), moving towards S for transfers, gait with device   Communication   Supervision-Mod I  Mod I  self-monitoring and correcting    Safety/Cognition/ Behavioral Observations    Quick release belt while up in chair as a reminder.   mod/i   reminder not to get up and call for assistance  Pain   no c/o pain; spasms controlled by scheduled baclofen; no prn's given  control spasms, no new pain  monitor and cont sched baclofen; prn meds as needed   Skin   CDI  CDI  assess and monitor skin qshift and prn      *See Care Plan and progress notes for long and short-term goals.  Barriers to Discharge: Heavy physical assist level, poor sleep interfering with therapy participation    Possible Resolutions to Barriers:  See above. Patient would like to be discharged earlier than date established    Discharge Planning/Teaching Needs:  HOme with fiance-Jamila who is here and observing and participating in therapies.      Team Discussion:  Spasms better controlled with meds. Progressing well in therapies.  Will need to train caregiver if d/c moved up.  Currently  min assist transfers-goals are supervision level.  May not reach goals if leaves early  Revisions to Treatment Plan:  Pt wants to be d/c sooner-family education needs to be completed prior to d/c   Continued Need for Acute Rehabilitation Level of Care: The patient requires daily medical management by a physician with specialized training in physical medicine and rehabilitation for the following conditions: Daily  direction of a multidisciplinary physical rehabilitation program to ensure safe treatment while eliciting the highest outcome that is of practical value to the patient.: Yes Daily medical management of patient stability for increased activity during participation in an intensive rehabilitation regime.: Yes Daily analysis of laboratory values and/or radiology reports with any subsequent need for medication adjustment of medical intervention for : Neurological problems  Rafe Mackowski, Lemar Livings 10/25/2012, 9:16 AM

## 2012-10-24 NOTE — Progress Notes (Signed)
Occupational Therapy Note  Patient Details  Name: Micheal Gonzalez MRN: [id number] Date of Birth: 11/29/1960 Today's Date: 10/24/2012  Time: 1400-1430 Pt denies pain Individual Therapy  Pt engaged in standing activities with weight bearing through RUE while reaching across midline with LUE to retrieve and replace objects on table.  Pt transitioned to therapy mat to work on active shoulder flexion/elbow extension with hand placed on stool. Pt exhibited shoulder flexion/extension and elbow extension/flexion.  Pt was very effective in isolating appropriate muscle groups to complete tasks.   Lavone Neri [facility] 10/24/2012, 3:29 PM

## 2012-10-25 ENCOUNTER — Inpatient Hospital Stay (HOSPITAL_COMMUNITY): Payer: 59

## 2012-10-25 ENCOUNTER — Inpatient Hospital Stay (HOSPITAL_COMMUNITY): Payer: 59 | Admitting: Speech Pathology

## 2012-10-25 ENCOUNTER — Inpatient Hospital Stay (HOSPITAL_COMMUNITY): Payer: 59 | Admitting: Occupational Therapy

## 2012-10-25 NOTE — Progress Notes (Signed)
Occupational Therapy Weekly Progress Note  Patient Details  Name: Micheal Gonzalez MRN: [id number] Date of Birth: [birth year]  Today's Date: 10/25/2012 Time: 1000-1100 Time Calculation (min): 60 min  Patient has met 3 of 5 short term goals.  Pt had two goals of standing with close supervision, but he still needs steadying assist as he does not have full motor control of his LUE.  Patient continues to demonstrate the following deficits: right UE/LE hemiplegia, decreased standing balance and decreased proprioception and therefore will continue to benefit from skilled OT intervention to enhance overall performance with BADL.  Patient progressing toward long term goals..  Continue plan of care.  OT Short Term Goals Week 1:  OT Short Term Goal 1 (Week 1): Pt. will groom self at sink in standing with minimal assist OT Short Term Goal 1 - Progress (Week 1): Met OT Short Term Goal 2 (Week 1): Pt. will use RUE as stabilizer with mod assist. OT Short Term Goal 2 - Progress (Week 1): Met OT Short Term Goal 3 (Week 1): Pt. transfer to tub/shower with minimal assist OT Short Term Goal 3 - Progress (Week 1): Met OT Short Term Goal 4 (Week 1): Pt. will dress self with minimal assist OT Short Term Goal 4 - Progress (Week 1): Met OT Short Term Goal 5 (Week 1): Pt. will transfer to toilet with minimal assist.   OT Short Term Goal 5 - Progress (Week 1): Not met Week 2:  OT Short Term Goal 1 (Week 2): Pt will complete stand pivot tub transfer with min assist  OT Short Term Goal 1 - Progress (Week 2): Met OT Short Term Goal 2 (Week 2): Pt will complete stand pivot toilet transfer with min assist  OT Short Term Goal 2 - Progress (Week 2): Met OT Short Term Goal 3 (Week 2):  Pt will use RUE as stabilizer during grooming task with 1 verbal cue OT Short Term Goal 3 - Progress (Week 2): Met OT Short Term Goal 4 (Week 2): Pt will complete LB dressing with close supervision for standing balance for management  around waist  OT Short Term Goal 4 - Progress (Week 2): Progressing toward goal OT Short Term Goal 5 (Week 2): Pt will complete 2 grooming tasks in standing with close supervision OT Short Term Goal 5 - Progress (Week 2): Progressing toward goal Week 3:  OT Short Term Goal 1 (Week 3): STGs = LTGs  Skilled Therapeutic Interventions/Progress Updates: Pt seen for BADL retraining of B/D at shower level with a focus on sit to stand, transfers and ambulation with platform walker, and standing balance.  He is continuing to progress with standing but continues to need steadying assist at times.  He also worked on Chiropodist transfers in tub with min assist to lift RLE in/out of tub.  No significant changes in RUE. He can use it as a stabilizer during grooming tasks but has no active finger flex/ext.    Continue OT 5-7 days a week, 1-2 x a day for 60-90 min with Balance/vestibular training;Community reintegration;Discharge planning;DME/adaptive equipment instruction;Functional electrical stimulation;Functional mobility training;Neuromuscular re-education;Patient/family education;Psychosocial support;Self Care/advanced ADL retraining;Splinting/orthotics;Therapeutic Exercise;Therapeutic Activities;UE/LE Strength taining/ROM;UE/LE Coordination activities;Wheelchair propulsion/positioning to maximize his independence with ADLs.  Therapy Documentation Precautions:  Precautions Precautions: Fall Precaution Comments:  (decreased balance; right sided weakness) Restrictions Weight Bearing Restrictions: No   Pain: No c/o pain.   ADL:  See FIM for current functional status  Therapy/Group: Individual Therapy  SAGUIER,JULIA 10/25/2012, 12:08 PM

## 2012-10-25 NOTE — Progress Notes (Signed)
Social Work Patient ID: Micheal Gonzalez, male   DOB: [birth year], 52 y.o.   MRN: [id number] Met with pt who reports: " I am leaning toward discharge Sat-7/5."  He is aware of the family training that needs to occur for this to happen He reports: " [name] is coming in tomorrow."  Discussed it may take more than one day for this to be completed.  He seems to just want to be out Of the hospital and not thinking about the progress he could make between now and next Thurs and the amount of care he will be for [facility].  Will see How family education goes.  Discussed DME and follow up therapies.  See tomorrow and discuss again discharge.

## 2012-10-25 NOTE — Progress Notes (Addendum)
Physical Therapy Weekly Progress Note  Patient Details  Name: Micheal Gonzalez MRN: [id number] Date of Birth: 1960/07/24  Today's Date: 10/25/2012 Time:1115-1200 and  [id number] Time Calculation (min):45 and  60 min  Patient has met 4 of 5 short term goals, and partly met stairs goal.    Patient continues to demonstrate the following deficits: strength, motor control, balance, activity tolerance, functional mobility, and therefore will continue to benefit from skilled PT intervention to enhance overall performance with activity tolerance, balance, postural control, ability to compensate for deficits, functional use of  right upper extremity and right lower extremity and awareness.  Patient progressing toward long term goals..Home w/c goal has been upgraded in Plan of Care.    PT Short Term Goals Week 2:  PT Short Term Goal 1 (Week 2): pt will transfer to L with close supervision PT Short Term Goal 1 - Progress (Week 2): Met PT Short Term Goal 2 (Week 2): pt will move supine to sit through L sidelying with supervision PT Short Term Goal 2 - Progress (Week 2): Met PT Short Term Goal 3 (Week 2): pt will propel w/c x 150' including threshold, negotiating congested spaces, modified independent PT Short Term Goal 3 - Progress (Week 2): Met PT Short Term Goal 4 (Week 2): pt will perform gait x 76' with assistance of 1 person, LRAD PT Short Term Goal 4 - Progress (Week 2): Met PT Short Term Goal 5 (Week 2): pt will ascend/descend 4 steps, 2 rails with min assist PT Short Term Goal 5 - Progress (Week 2): Partly met    Skilled Therapeutic Interventions/Progress Updates:   Treatment 1:  focused on transfers, neuromuscular re-education via demo, VCs, manual cues, tactile cues for RLE activation for stance stability and RLE swing phase components on level tile and stairs.  Simulated car transfer to sedan height seat with min assist, stand pivot.  Up/down 5 steps with 1 rail, min assist for  majority of steps, mod assist due to 1 LOB due to narrow BOS when descending 7" high steps.  ACe on RLE.  Gait with RW with R hand orthosis, x 87' without platform support, ACe on RLE, min> mod assist.  Mod assist needed during turns.  Focus on wider BOS, R hip stability, R foot placement, upright stance.    In standing, R open chain hip abduction, kicking a ball laterally.  Pt elicited hip abduction with hip flexion for functional lateral kicking, 2 x 10 reps.  Treatment 2:  Treatment focused on trials of R AFOs, neuromuscular re-education via VCs, manual and tactile cues for RLE stance stability and swing phase components, transfers.  Bed> w/c to L stand pivot with close supervision,  VCs for RLE activation.  Gait in parallel bars without ACE or AFO demonstrates trace R DF, mostly inversion, hip flexion with ER to advance RLE, which results in very narrow BOS and LOB.  Gait wearing R Allard brace with heel wedge; pt uncomfortable, feels less stable than with ACE on RLE. Gait with pre-fab posterior leaf spring R AFO.  Pt comfortable, eventually x 150' with min/mod assist for wt shift to R, R foot placement, forward gaze, using RPFRW.   Up/down 3 (5" ) steps with 2 rails, min assist, x 2, wearing RAFO. Pt is able to elicit R hip abduction with extra time when placing foot on a step.  Discussed possible need for custom RAFO.  Pt stated he would stay at [facility] until the 7/8, and start OPPT  the 7/9. Friends nor girlfriend here in the last few day; no family ed has occurred to date.  MSW has spoken with pt numerous times about need for lead time for family ed and procuring equipment.    Therapy Documentation Precautions:  Precautions Precautions: Fall Precaution Comments:  (decreased balance; right sided weakness) Restrictions Weight Bearing Restrictions: No     Locomotion : Ambulation Ambulation/Gait Assistance: 3: Mod assist    Other Treatments: Treatments Neuromuscular Facilitation:  Right;Forced use;Activity to increase sustained activation;Lower Extremity;Activity to increase motor control;Activity to increase timing and sequencing;Activity to increase anterior-posterior weight shifting;Activity to increase lateral weight shifting;Upper Extremity Weight Bearing Technique RUE Weight Bearing Technique: Forearm standing (during gait with RPFRW)  See FIM for current functional status  Therapy/Group: Individual Therapy  Alandria Butkiewicz 10/25/2012, 4:22 PM

## 2012-10-25 NOTE — Progress Notes (Signed)
Speech Language Pathology Daily Session Note  Patient Details  Name: Micheal Gonzalez MRN: [id number] Date of Birth: 1960-07-23  Today's Date: 10/25/2012 Time: 1330-1400 Time Calculation (min): 30 min  Short Term Goals: Week 2: SLP Short Term Goal 1 (Week 2): Patient will perform oral motor exercises with Modified Independence. SLP Short Term Goal 1 - Progress (Week 2): Progressing toward goal SLP Short Term Goal 2 (Week 2): Patient will identify compensatory strategies to increase intelligible speech Modified Independence. SLP Short Term Goal 2 - Progress (Week 2): Progressing toward goal SLP Short Term Goal 3 (Week 2): Patient will utlize compensatory strategies to increase intelligible speech at the conversation level with Supervision level cues from clinician or significant other.   SLP Short Term Goal 3 - Progress (Week 2): Progressing toward goal  Skilled Therapeutic Interventions: Session today focused on utilizing speech intelligibility strategies.  SLP facilitated session with Supervision level verbal cues to utilize over articulation during conversational speech to increase clarity of consonant blends and avoid distortions.   FIM:  Comprehension Comprehension Mode: Auditory Comprehension: 7-Follows complex conversation/direction: With no assist Expression Expression Mode: Verbal Expression: 5-Expresses complex 90% of the time/cues < 10% of the time Social Interaction Social Interaction: 6-Interacts appropriately with others with medication or extra time (anti-anxiety, antidepressant). Problem Solving Problem Solving: 6-Solves complex problems: With extra time Memory Memory: 7-Complete Independence: No helper FIM - Eating Eating Activity: 7: Complete independence:no helper  Pain Pain Assessment Pain Assessment: No/denies pain  Therapy/Group: Individual Therapy  Charlane Ferretti., CCC-SLP [phone]  Micheal Gonzalez 10/25/2012, 4:35 PM

## 2012-10-26 ENCOUNTER — Inpatient Hospital Stay (HOSPITAL_COMMUNITY): Payer: 59 | Admitting: Speech Pathology

## 2012-10-26 ENCOUNTER — Inpatient Hospital Stay (HOSPITAL_COMMUNITY): Payer: 59 | Admitting: *Deleted

## 2012-10-26 ENCOUNTER — Inpatient Hospital Stay (HOSPITAL_COMMUNITY): Payer: 59 | Admitting: Occupational Therapy

## 2012-10-26 LAB — BASIC METABOLIC PANEL
BUN: 10 mg/dL (ref 6–23)
CO2: 24 mEq/L (ref 19–32)
Calcium: 9.4 mg/dL (ref 8.4–10.5)
Creatinine, Ser: 1.17 mg/dL (ref 0.50–1.35)
GFR calc non Af Amer: 70 mL/min — ABNORMAL LOW (ref >90)
Glucose, Bld: 141 mg/dL — ABNORMAL HIGH (ref 70–99)

## 2012-10-26 LAB — CREATININE, SERUM: Creatinine, Ser: 1.27 mg/dL (ref 0.50–1.35)

## 2012-10-26 MED ORDER — BACLOFEN 10 MG PO TABS
10.0000 mg | ORAL_TABLET | ORAL | Status: DC
  Filled 2012-10-26 (×3): qty 1

## 2012-10-26 MED ORDER — BACLOFEN 10 MG PO TABS: 10.0000 mg | ORAL_TABLET | Freq: Four times a day (QID) | ORAL | Status: DC

## 2012-10-26 MED ORDER — ASPIRIN EC 325 MG PO TBEC
325.0000 mg | DELAYED_RELEASE_TABLET | Freq: Every day | ORAL | Status: DC
  Administered 2012-10-27 – 2012-10-29 (×3): 325 mg via ORAL
  Filled 2012-10-26 (×5): qty 1

## 2012-10-26 MED ORDER — BACLOFEN 20 MG PO TABS
20.0000 mg | ORAL_TABLET | ORAL | Status: DC
  Administered 2012-10-26 (×2): 20 mg via ORAL
  Filled 2012-10-26 (×4): qty 1

## 2012-10-26 MED ORDER — BACLOFEN 20 MG PO TABS
20.0000 mg | ORAL_TABLET | Freq: Four times a day (QID) | ORAL | Status: DC
  Filled 2012-10-26 (×4): qty 1

## 2012-10-26 NOTE — Progress Notes (Signed)
Social Work Patient ID: Micheal Gonzalez, male   DOB: [birth year], 52 y.o.   MRN: [id number] Met with pt who did not get the increased medicine last night so slept very poorly and feels bad today.  He has refused all am therapies and may participate in 1;00 therapy. Discussed would need to do family education with Jamila prior to discharge.  He states; " I can not have another night like last night, I would rather go home then."  MD has Ordered the increased medicine for tonight.  Encouraged him to stay until Tues to get more therapies and complete education.  Discussed he would be a difficult car transfer and Lack of therapy until next Wed via OP.  His main goal is to get some sleep.  He plans to make some calls and let me know what he decides.  I will order DME incase he decided to leave tomorrow. Pt requires a lightweight wheelchair to be able to self propel and use for his self care tasks.  He is unable to self propel in a standard wheelchair.  Will continue to work on discharge needs and await his decision.

## 2012-10-26 NOTE — Plan of Care (Signed)
Problem: RH KNOWLEDGE DEFICIT Goal: RH STG INCREASE KNOWLEDGE OF HYPERTENSION Patient will be able to verbalize methods to manage high blood pressure at home.( diet, medications, follow up with PCP, etc.)  Outcome: Progressing Pt currently not on any medication for high blood pressure did discuss diet and follow up with PCP

## 2012-10-26 NOTE — Progress Notes (Signed)
Social Work Patient ID: Micheal Gonzalez, male   DOB: [birth year], 52 y.o.   MRN: [id number] Have ordered DME and it has been delivered to pt's room.  Pt feels better knowing the DME is ready for him to take home, so when he is  Discharged he will not have to wait for it.  He will have Jamila here Monday to participate in therapies with him.  Hopefully he will sleep better over The weekend with the medication adjusted.  See Monday am.

## 2012-10-26 NOTE — Progress Notes (Signed)
Physical Therapy Session Note  Patient Details  Name: Micheal Gonzalez MRN: [id number] Date of Birth: 1960-09-02  Today's Date: 10/26/2012 Time:  13:00-14:46 ( ) and 15:00-15:25 ( )   Pt missed of PT due to fatigue, not slept last night.   Skilled Therapeutic Interventions/Progress Updates:  First Treatment Tx focused on safety with transfers, curb step into house, and gait with RW and hand orthosis.  Pt propelled WC on unit x150' with increased time and difficulty maintaining straight path away from obstacles especially on L side.  Serial WC<>mat transfers for practice x6 with close S and cues for safety and efficiency including WC placement, foot placement, increasing use of R LE, and completing turn prior to sitting.   Gait training up/down curb step (home entry) with RW and hand orthosis with Mod A for balance. Pt able to manage RW, but narrow BOS causing posterior and R LOB. Discussed girlfriend's role in assisting.   Gait training in controlled environment with RW and hand orthosis with Mod A overall due to several LOB, needing assist to recover.     Second treatment Visit with orthotist for fit and training. Trialed Blue rocker with heel wedge and good success. Pt still very fatigued this afternoon, needing Mod A for balance gait training 1x150' in controlled environment. Pt with decreased R knee hyperextension and similar foot placement with Blue rocker vs posterior leaf AFO. Orthotist did not see need for custom bracing at this time, and pre-fab was comfortable for pt. Pt educated on safe use and care, but was advised to plan on [facility] as primary mobility in the home until progressing more in outpatient PT with gait.   Therapy Documentation Precautions:  Precautions Precautions: Fall Precaution Comments:  (decreased balance; right sided weakness) Restrictions Weight Bearing Restrictions: No General: Amount of Missed PT Time (min): 60 Minutes Missed Time Reason:  Patient fatigue Vital Signs:    See FIM for current functional status  Therapy/Group: Individual Therapy  Clydene Laming, PT, DPT  10/26/2012, 8:42 AM

## 2012-10-26 NOTE — Progress Notes (Addendum)
Patient ID: Micheal Gonzalez, male   DOB: [birth year], 52 y.o.   MRN: [id number] Subjective/Complaints: 52 y.o. male admitted on 10/09/12 with right sided numbness with RLE Heaviness as well as lightheadedness. 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 2D echo with EF 55-60%. Carotid dopplers without ICA stenosis. 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 PT/OT evaluations pending. Patient with right hemiparesis with sensory deficits and difficulty with mobility--did have worsening of symptoms yesterday post BP meds. Neurology feels patient with progression and/or hypoperfusion. RUE remains flaccid  Slept poorly despite Baclofen Q6 h dosing  No new issues,pt is c/o spasms at night RUE flexor, RLE extensor  Review of Systems  Neurological:       Spasms RLE   Objective: Vital Signs: Blood pressure 131/82, pulse 53, temperature 97.5 F (36.4 C), temperature source Oral, resp. rate 19, height 6\' 1"  (1.854 m), weight 97.8 kg (215 lb 9.8 oz), SpO2 96.00%. No results found. Results for orders placed during the hospital encounter of 10/12/12 (from the past 72 hour(s))  CREATININE, SERUM     Status: Abnormal   Collection Time    10/26/12  8:30 AM      Result Value Range   Creatinine, Ser 1.27  0.50 - 1.35 mg/dL   GFR calc non Af Amer 63 (*) >90 mL/min   GFR calc Af Amer 73 (*) >90 mL/min   Comment:            The eGFR has been calculated     using the CKD EPI equation.     This calculation has not been     validated in all clinical     situations.     eGFR's persistently     <90 mL/min signify     possible Chronic Kidney Disease.    Constitutional: He is oriented to person, place, and time. He appears well-developed and well-nourished.  HENT: oral mucosa pink and moist  Head: Normocephalic and atraumatic.  Eyes: Pupils are equal,  round, and reactive to light.  Neck: Normal range of motion. Neck supple.  Cardiovascular: Normal rate and regular rhythm. No murmurs or gallops  No murmur heard.  Pulmonary/Chest: Effort normal and breath sounds normal. No respiratory distress.  Abdominal: Soft. Bowel sounds are normal. Non tender non distended Musculoskeletal: He exhibits no edema and no tenderness.  Neurological: He is alert and oriented to person, place, and time.  Mild right facial weakness with minimal dysarthria. Follows commands without difficulty. Right biceps Ashworth 2 Skin: Skin is warm and dry.  Psychiatric: He has a normal mood and affect. His behavior is normal. Thought content normal.  Right central 7 with dysarthric speech. Right pec, biceps 2-/5. 0/5 with trice, wrist, HI, RLE is 3-/5 with HF and KE, 0/5 with ADF and APF.Marland Kitchen No resting tone. DTR's 2-3+ on Right side no clonus. Cognitively appropriate.    Assessment/Plan: 1. Functional deficits secondary to Right hemiparesis, L PLIC infarct which require 3+ hours per day of interdisciplinary therapy in a comprehensive inpatient rehab setting. Physiatrist is providing close team supervision and 24 hour management of active medical problems listed below. Physiatrist and rehab team continue to assess barriers to discharge/monitor patient progress toward functional and medical goals.  Patient has expressed interest in discharge on 10/27/2012. Medically is stable. We have increased his baclofen for spasms. He would benefit from additional inpatient rehabilitation to  increase transfers to a min assist or supervision assist level and decrease burden of care on family members. He is set up all ready for outpatient therapy at neuro rehabilitation on 10/31/2012.  I encouraged the patient to stay until 10/30/2012 Needs equipment such as bedside commode if he goes home prior to next week. FIM: FIM - Bathing Bathing Steps Patient Completed: Chest;Right Arm;Left  Arm;Abdomen;Front perineal area;Buttocks;Right upper leg;Left upper leg;Right lower leg (including foot);Left lower leg (including foot) Bathing: 4: Steadying assist  FIM - Upper Body Dressing/Undressing Upper body dressing/undressing steps patient completed: Thread/unthread right sleeve of pullover shirt/dresss;Thread/unthread left sleeve of pullover shirt/dress;Pull shirt over trunk;Put head through opening of pull over shirt/dress Upper body dressing/undressing: 5: Set-up assist to: Obtain clothing/put away FIM - Lower Body Dressing/Undressing Lower body dressing/undressing steps patient completed: Thread/unthread right underwear leg;Thread/unthread left underwear leg;Pull underwear up/down;Thread/unthread right pants leg;Thread/unthread left pants leg;Pull pants up/down;Don/Doff right sock;Don/Doff left sock;Don/Doff left shoe Lower body dressing/undressing: 4: Min-Patient completed 75 plus % of tasks  FIM - Toileting Toileting steps completed by patient: Adjust clothing prior to toileting;Performs perineal hygiene;Adjust clothing after toileting Toileting Assistive Devices: Grab bar or rail for support Toileting: 4: Steadying assist  FIM - Diplomatic Services operational officer Devices: Grab bars Toilet Transfers: 4-To toilet/BSC: Min A (steadying Pt. > 75%);4-From toilet/BSC: Min A (steadying Pt. > 75%)  FIM - Bed/Chair Transfer Bed/Chair Transfer Assistive Devices: Arm rests Bed/Chair Transfer: 5: Bed > Chair or W/C: Supervision (verbal cues/safety issues) (to L)  FIM - Locomotion: Wheelchair Distance: 150 Locomotion: Wheelchair: 6: Travels 150 ft or more, turns around, maneuvers to table, bed or toilet, negotiates 3% grade: maneuvers on rugs and over door sills independently FIM - Locomotion: Ambulation Locomotion: Ambulation Assistive Devices: Walker - Rolling;Walker - Platform;Orthosis (R AFO) Ambulation/Gait Assistance: 3: Mod assist Locomotion: Ambulation: 3: Travels 150  ft or more with moderate assistance (Pt: 50 - 74%)  Comprehension Comprehension Mode: Auditory Comprehension: 7-Follows complex conversation/direction: With no assist  Expression Expression Mode: Verbal Expression: 5-Expresses complex 90% of the time/cues < 10% of the time  Social Interaction Social Interaction: 6-Interacts appropriately with others with medication or extra time (anti-anxiety, antidepressant).  Problem Solving Problem Solving: 6-Solves complex problems: With extra time  Memory Memory: 7-Complete Independence: No helper  Medical Problem List and Plan:  1. DVT Prophylaxis/Anticoagulation: Pharmaceutical: Lovenox  2. Pain Management: N/A  3. Mood: Provide ego support. Mild underlying anxiety with recent progression. LCSW to follow for evaluation.  4. Neuropsych: This patient is capable of making decisions on his own behalf.  5. HTN: Will monitor with bid checks. Use meds prn SBP >180 or DBP>110. Allow BP to run a little high for now--is still elevated in a week to add medication per neuro.  6. RLS: Chronic , Will continue klonopin and Requip. he's had more problems sleeping since stroke.  7. Hyperlipidemia: On statin to get LDL <100.  8. Insomnia:Has spasms that interfere with sleep, developing increased biceps spasticity, does not seem to respond to Robaxin or tizanidine, Increase  baclofen 20mg  Q 6 h -klonopin and ambien qhs   LOS (Days) 14 A FACE TO FACE EVALUATION WAS PERFORMED  Kristain Filo E 10/26/2012, 11:26 AM

## 2012-10-26 NOTE — Progress Notes (Signed)
Patient has elected to complete his program and have family in on Monday for education. He's concerned about daytime doses of baclofen. Educated on need--will change am doses to 10 mg and try 20 mg in pm. If this doesn't work was advised that he'll need higher doses throughout the day to help with symptoms.

## 2012-10-26 NOTE — Progress Notes (Signed)
Social Work Patient ID: Micheal Gonzalez, male   DOB: 05/18/1960, 52 y.o.   MRN: [id number] Spoke with pt who has decided to stay until Monday-still will need to do family education with caregiver-Jamila prior to discharge, pt informed of. He is aware and will have her here Monday.  Will order DME and work toward discharge.  It does not appear pt realizes it may take a couple Of sessions with therapy team for Jamila to feel comfortable with his care.  His main focus seems to be his sleep issues.  He  seems unable to look at the big Picture of his care and deficits.  Will continue to work on education and safe discharge plan.

## 2012-10-26 NOTE — Progress Notes (Addendum)
Occupational Therapy Cancellation Note  Patient Details  Name: Micheal Gonzalez MRN: [id number] Date of Birth: [birth year] Today's Date: 10/26/2012  Patient with sign on the closed door asking not to be disturbed because patient was sleeping (1100).  RN stated that it was OK to check in with patient to see if he would work with OT.  Room dark and patient resting in bed.  Patient missed 60 minute scheduled OT self care session due to reports too tired and not sleeping at night due to spasms. Patient with slurred speech and when brought to his attention, he denied that he felt any different or that this could be due to medication and stated that it is because he needed rest.  Reviewed with patient that his shower today is scheduled with OT and the nursing staff do not have it on their schedule therefore, he won't be able to shower until tomorrow.  Patient still declined.  Chancey Cullinane 10/26/2012, 11:52 AM

## 2012-10-26 NOTE — Progress Notes (Signed)
Orthopedic Tech Progress Note Patient Details:  Micheal Gonzalez [birth year] [id number] Brace order completed by [facility]. Patient ID: Micheal Gonzalez, male   DOB: [birth year], 52 y.o.   MRN: [id number]   [name] 10/26/2012, 4:41 PM

## 2012-10-26 NOTE — Progress Notes (Signed)
Paged Marissa Nestle, PA and discussed that patient declining therapies and declining to get up out of bed. Patient states" I didn't sleep at all due to leg spasms." Patient's speech more slurred today. Patient states that " it is only because I am exhausted" Just need to rest now. Vital signs stable blood pressure 128/84 pulse 60, oxygen saturation 97%. No new orders given at this time. Roberts-VonCannon, Zelma Mazariego Elon Jester

## 2012-10-26 NOTE — Progress Notes (Signed)
SLP Cancellation Note  Patient Details Name: Micheal Gonzalez MRN: [id number] DOB: [birth year]   Cancelled treatment:        Patient missed 30 minutes of skilled SLP services due to refusal to participate due to report of pain and not sleeping last night.  Session was intended to target family education with significant other and patient; however, this was not completed and as a result, it is recommended that this patient continue to receive skilled service next week to finalize education prior to discharge home with outpatient follow-up.   Fae Pippin, M.A., CCC-SLP [phone]  Cami Delawder 10/26/2012, 10:23 AM

## 2012-10-27 ENCOUNTER — Inpatient Hospital Stay (HOSPITAL_COMMUNITY): Payer: 59 | Admitting: Physical Therapy

## 2012-10-27 DIAGNOSIS — M62838 Other muscle spasm: Secondary | ICD-10-CM

## 2012-10-27 MED ORDER — DOXYCYCLINE HYCLATE 100 MG PO TABS
100.0000 mg | ORAL_TABLET | Freq: Two times a day (BID) | ORAL | Status: DC
  Administered 2012-10-27 – 2012-10-29 (×5): 100 mg via ORAL
  Filled 2012-10-27 (×7): qty 1

## 2012-10-27 MED ORDER — BACLOFEN 20 MG PO TABS
20.0000 mg | ORAL_TABLET | Freq: Three times a day (TID) | ORAL | Status: DC
  Administered 2012-10-27 – 2012-10-29 (×6): 20 mg via ORAL
  Filled 2012-10-27 (×9): qty 1

## 2012-10-27 MED ORDER — NON FORMULARY: 1.0000 | Freq: Two times a day (BID) | Status: DC

## 2012-10-27 MED ORDER — SODIUM CHLORIDE 3 % IV SOLN
Freq: Two times a day (BID) | INTRAVENOUS | Status: DC
  Administered 2012-10-27 – 2012-10-28 (×4)
  Filled 2012-10-27 (×8): qty 500

## 2012-10-27 NOTE — Progress Notes (Signed)
Physical Therapy Session Note  Patient Details  Name: Micheal Gonzalez MRN: [id number] Date of Birth: [birth year]  Today's Date: 10/27/2012 Time: 1027-2536 Time Calculation (min): 45 min  Short Term Goals: Week 1:  PT Short Term Goal 1 (Week 1): Pt will roll L with supervision, 0-1 VCs. PT Short Term Goal 1 - Progress (Week 1): Met PT Short Term Goal 2 (Week 1): Pt will transfer stand/pivot to L with min assist. PT Short Term Goal 2 - Progress (Week 1): Met PT Short Term Goal 3 (Week 1): Pt will propel w/c x 150' with supervision. PT Short Term Goal 3 - Progress (Week 1): Met PT Short Term Goal 4 (Week 1): Pt will perform gait x 20' with LRAD and assistance of 1 person. PT Short Term Goal 4 - Progress (Week 1): Partly met PT Short Term Goal 5 (Week 1): Pt will ascend/descend 3 steps with mod assist. PT Short Term Goal 5 - Progress (Week 1): Met   Therapy Documentation Precautions:  Precautions Precautions: Fall Precaution Comments:  (decreased balance; right sided weakness) Restrictions Weight Bearing Restrictions: No Pain: denies pain currently  Therapeutic Activity:(15') sizing and fitting of R platform RW and instructions in sequence of positioning of R UE on/off with transfers up into and down out of RW. Patient needing min-Assist Gait Training:(15') using R platform RW 2 x 40' and 1 x 100' with CGA/min-Assist for occasional LOB backward when not stepping forward far enough with R LE. Using AFO on Right Therapeutic Exercise:(15') B LE in sitting manually resisted.    Therapy/Group: Individual Therapy  Alston Berrie J 10/27/2012, 3:40 PM

## 2012-10-27 NOTE — Progress Notes (Signed)
Micheal Gonzalez is a 52 y.o. male [birth year] [id number]  Subjective: C/o spasms at night - he would like to have Baclofen on schedule. C/o L 3d finger pain x 4 d. Slept not well. Feeling OK overall.  Objective: Vital signs in last 24 hours: Temp:  [97.2 F (36.2 C)-98.3 F (36.8 C)] 97.2 F (36.2 C) (07/05 0540) Pulse Rate:  [52-60] 52 (07/05 0540) Resp:  [17-18] 17 (07/05 0540) BP: (111-144)/(64-84) 111/64 mmHg (07/05 0540) SpO2:  [93 %-97 %] 93 % (07/05 0540) Weight change:  Last BM Date: 10/26/12  Intake/Output from previous day: 07/04 0701 - 07/05 0700 In: 630 [P.O.:630] Out: -  Last cbgs: CBG (last 3)  No results found for this basename: GLUCAP,  in the last 72 hours   Physical Exam General: No apparent distress    HEENT: moist mucosa Lungs: Normal effort. Lungs clear to auscultation, no crackles or wheezes. Cardiovascular: Regular rate and rhythm, no edema Musculoskeletal:  No change from before Neurological: No new neurological deficits Wounds: N/A    Skin: L 3d finger with tip cellulitis and ?forming pus collection Alert, cooperative   Lab Results: BMET    Component Value Date/Time   NA 137 10/26/2012 1445   K 3.6 10/26/2012 1445   CL 101 10/26/2012 1445   CO2 24 10/26/2012 1445   GLUCOSE 141* 10/26/2012 1445   BUN 10 10/26/2012 1445   CREATININE 1.17 10/26/2012 1445   CREATININE 1.22 05/17/2012 1012   CALCIUM 9.4 10/26/2012 1445   GFRNONAA 70* 10/26/2012 1445   GFRAA 81* 10/26/2012 1445   CBC    Component Value Date/Time   WBC 7.3 10/12/2012 1632   RBC 5.03 10/12/2012 1632   HGB 16.7 10/12/2012 1632   HCT 46.4 10/12/2012 1632   PLT 177 10/12/2012 1632   MCV 92.2 10/12/2012 1632   MCH 33.2 10/12/2012 1632   MCHC 36.0 10/12/2012 1632   RDW 12.9 10/12/2012 1632   LYMPHSABS 2.2 10/12/2012 1632   MONOABS 0.6 10/12/2012 1632   EOSABS 0.1 10/12/2012 1632   BASOSABS 0.0 10/12/2012 1632    Studies/Results: No results found.  Medications: I have reviewed the patient's  current medications.  Assessment/Plan:   1. DVT Prophylaxis/Anticoagulation: Pharmaceutical: Lovenox  2. Pain Management: N/A  3. Mood: Provide ego support. Mild underlying anxiety with recent progression. LCSW to follow for evaluation.  4. Neuropsych: This patient is capable of making decisions on his own behalf.  5. HTN: Will monitor with bid checks. Use meds prn SBP >180 or DBP>110. Allow BP to run a little high for now--is still elevated in a week to add medication per neuro.  6. RLS: Chronic , Will continue klonopin and Requip. he's had more problems sleeping since stroke.  7. Hyperlipidemia: On statin to get LDL <100.  8. Insomnia:Has spasms that interfere with sleep, developing increased biceps spasticity, does not seem to respond to Robaxin or tizanidine, Increase baclofen 20mg  Q 6 h  -klonopin and ambien qhs 9. Spasms - Baclofen on schedule 10. L 3d fingertip cellulitis - may need I&D. Start soaks and po Doxy    Length of stay, days: 15  Sonda Primes , MD 10/27/2012, 9:02 AM

## 2012-10-28 ENCOUNTER — Inpatient Hospital Stay (HOSPITAL_COMMUNITY): Payer: 59 | Admitting: Physical Therapy

## 2012-10-28 MED ORDER — LOSARTAN POTASSIUM 25 MG PO TABS
25.0000 mg | ORAL_TABLET | Freq: Every day | ORAL | Status: DC
  Administered 2012-10-29: 25 mg via ORAL
  Filled 2012-10-28 (×2): qty 1

## 2012-10-28 NOTE — Progress Notes (Signed)
Micheal Gonzalez is a 52 y.o. male [birth year] [id number]  Subjective: C/o spasms at night - he wanted to have Baclofen on schedule - better. C/o L 3d finger pain x 4 d - better.  Feeling OK overall.  Objective: Vital signs in last 24 hours: Temp:  [97.5 F (36.4 C)-98 F (36.7 C)] 97.5 F (36.4 C) (07/06 0628) Pulse Rate:  [65] 65 (07/06 0628) Resp:  [18-20] 18 (07/06 0628) BP: (144-150)/(80-95) 150/95 mmHg (07/06 0628) SpO2:  [97 %-98 %] 97 % (07/06 0628) Weight change:  Last BM Date: 10/26/12  Intake/Output from previous day: 07/05 0701 - 07/06 0700 In: 840 [P.O.:840] Out: -  Last cbgs: CBG (last 3)  No results found for this basename: GLUCAP,  in the last 72 hours   Physical Exam General: No apparent distress    HEENT: moist mucosa Lungs: Normal effort. Lungs clear to auscultation, no crackles or wheezes. Cardiovascular: Regular rate and rhythm, no edema Musculoskeletal:  No change from before Neurological: No new neurological deficits Wounds: N/A    Skin: L 3d finger with tip cellulitis -better Alert, cooperative   Lab Results: BMET    Component Value Date/Time   NA 137 10/26/2012 1445   K 3.6 10/26/2012 1445   CL 101 10/26/2012 1445   CO2 24 10/26/2012 1445   GLUCOSE 141* 10/26/2012 1445   BUN 10 10/26/2012 1445   CREATININE 1.17 10/26/2012 1445   CREATININE 1.22 05/17/2012 1012   CALCIUM 9.4 10/26/2012 1445   GFRNONAA 70* 10/26/2012 1445   GFRAA 81* 10/26/2012 1445   CBC    Component Value Date/Time   WBC 7.3 10/12/2012 1632   RBC 5.03 10/12/2012 1632   HGB 16.7 10/12/2012 1632   HCT 46.4 10/12/2012 1632   PLT 177 10/12/2012 1632   MCV 92.2 10/12/2012 1632   MCH 33.2 10/12/2012 1632   MCHC 36.0 10/12/2012 1632   RDW 12.9 10/12/2012 1632   LYMPHSABS 2.2 10/12/2012 1632   MONOABS 0.6 10/12/2012 1632   EOSABS 0.1 10/12/2012 1632   BASOSABS 0.0 10/12/2012 1632    Studies/Results: No results found.  Medications: I have reviewed the patient's current  medications.  Assessment/Plan:   1. DVT Prophylaxis/Anticoagulation: Pharmaceutical: Lovenox  2. Pain Management: N/A  3. Mood: Provide ego support. Mild underlying anxiety with recent progression. LCSW to follow for evaluation.  4. Neuropsych: This patient is capable of making decisions on his own behalf.  5. HTN: Will monitor with bid checks. Use meds prn SBP >180 or DBP>110. Allow BP to run a little high for now--is still elevated in a week to add medication per neuro - start Losartan.  6. RLS: Chronic , Will continue klonopin and Requip. he's had more problems sleeping since stroke.  7. Hyperlipidemia: On statin to get LDL <100.  8. Insomnia:Has spasms that interfere with sleep, developing increased biceps spasticity, does not seem to respond to Robaxin or tizanidine, Increase baclofen 20mg  Q 6 h  -klonopin and ambien qhs 9. Spasms - Baclofen on schedule 10. L 3d fingertip cellulitis - better with soaks and po Doxy    Length of stay, days: 16  Sonda Primes , MD 10/28/2012, 8:29 AM

## 2012-10-28 NOTE — Progress Notes (Signed)
Physical Therapy Note  Patient Details  Name: SUYASH AMORY MRN: [id number] Date of Birth: 28-Aug-1960 Today's Date: 10/28/2012  1000-1055 (55 minutes) individual Pain: no reported pain Focus of treatment: wc mobility - SBA on unit using left extremities; transfer with RT PFRW min assist; Nustep Level 6 X 10 minutes LEs only; Neuro re-ed RT LE- supine AA hip flexion with manually resisted hip /knee extension, sidelying AA hip abduction; Standing with RT UE support focused on stepping forward and backward on RT LE to facilitate use of hamstrings (pt able to initiate knee flexion in standing); gait 25 feet RT PFRW + RT AFO min assist for balance especially when changing directions.     Keyshawn Hellwig,JIM 10/28/2012, 10:17 AM

## 2012-10-29 ENCOUNTER — Ambulatory Visit (HOSPITAL_COMMUNITY): Payer: 59

## 2012-10-29 ENCOUNTER — Inpatient Hospital Stay (HOSPITAL_COMMUNITY): Payer: 59 | Admitting: Occupational Therapy

## 2012-10-29 ENCOUNTER — Inpatient Hospital Stay (HOSPITAL_COMMUNITY): Payer: 59 | Admitting: Speech Pathology

## 2012-10-29 DIAGNOSIS — I6330 Cerebral infarction due to thrombosis of unspecified cerebral artery: Secondary | ICD-10-CM

## 2012-10-29 DIAGNOSIS — G8110 Spastic hemiplegia affecting unspecified side: Secondary | ICD-10-CM

## 2012-10-29 MED ORDER — ASPIRIN 325 MG PO TBEC: 325.0000 mg | DELAYED_RELEASE_TABLET | Freq: Every day | ORAL | Status: DC

## 2012-10-29 MED ORDER — ZOLPIDEM TARTRATE 5 MG PO TABS: 5.0000 mg | ORAL_TABLET | Freq: Every day | ORAL | Status: DC

## 2012-10-29 MED ORDER — DOXYCYCLINE HYCLATE 100 MG PO TABS: 100.0000 mg | ORAL_TABLET | Freq: Two times a day (BID) | ORAL | Status: DC

## 2012-10-29 MED ORDER — LOSARTAN POTASSIUM 25 MG PO TABS: 25.0000 mg | ORAL_TABLET | Freq: Every day | ORAL | Status: DC

## 2012-10-29 MED ORDER — BACLOFEN 20 MG PO TABS: 20.0000 mg | ORAL_TABLET | Freq: Three times a day (TID) | ORAL | Status: DC

## 2012-10-29 MED ORDER — BACLOFEN 20 MG PO TABS: 20.0000 mg | ORAL_TABLET | Freq: Four times a day (QID) | ORAL | Status: DC

## 2012-10-29 NOTE — Discharge Summary (Signed)
Physician Discharge Summary  Patient ID: Micheal Gonzalez MRN: [id number] DOB/AGE: 30-Jun-1960 52 y.o.  Admit date: 10/12/2012 Discharge date: 10/29/2012  Discharge Diagnoses:  Principal Problem:   CVA (cerebral infarction) Active Problems:   RLS (restless legs syndrome)   HTN (hypertension)   Discharged Condition: Good.   Labs:  Basic Metabolic Panel:  Recent Labs Lab 10/26/12 0830 10/26/12 1445  NA  --  137  K  --  3.6  CL  --  101  CO2  --  24  GLUCOSE  --  141*  BUN  --  10  CREATININE 1.27 1.17  CALCIUM  --  9.4    CBC: No results found for this basename: WBC, NEUTROABS, HGB, HCT, MCV, PLT,  in the last 168 hours  CBG: No results found for this basename: GLUCAP,  in the last 168 hours  Brief HPI:   Micheal Gonzalez is a 52 y.o. male admitted on 10/09/12 with right sided numbness with RLE Heaviness as well as lightheadedness. 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 2D echo with EF 55-60%. Carotid dopplers without ICA stenosis. 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 PT/OT evaluations pending. Patient with resultant right hemiparesis with sensory deficits and difficulty with mobility. He did  have worsening of symptoms  post BP medications and neurology felt that  patient with progression and/or hypoperfusion. He was admitted to [facility] for progressive therapies.    Hospital Course: Micheal Gonzalez was admitted to rehab 10/12/2012 for inpatient therapies to consist of PT, ST and OT at least three hours five days a week. Past admission physiatrist, therapy team and rehab RN have worked together to provide customized collaborative inpatient rehab. Blood pressures were monitored on bid basis and cozaar was added due to continued elevation in SBP. He has had problems with spasticity especially at nights and  baclofen was added and slowly titrated to 20 mg qid.  He has had insomnia due to spasms and Ambien was effective in helping with sleep hygiene. He denied problems with depression or anxiety. Neuropsychology has followed for support as well as evaluation. He demonstrates reduced incidental memory as well as impairment in rote auditory learning and recall. He continues with right sided weakness and right AFO was ordered to help with gait. He elected on going home prior to set discharge date therefore his goals were not achieved. Family education was done with girlfriend who can assist as needed past discharge. He will continue to receive outpatient physical and occupational therapy past discharge.    Rehab course: During patient's stay in rehab weekly team conferences were held to monitor patient's progress, set goals and discuss barriers to discharge. Speech therapy-addressing speech intelligibility techniques as well as educated on oro motor exercises and cognitive-linguistic goals. He requires increased time to complete complex problems. He requires supervision to use compensatory techniques for speech clarity.  Occupational therapy has focused on neuromuscular reeducation of RUE as well as ADL tasks and endurance. He requires min to steady assist for lower body ADLs. He has had improved activity tolerance, balance, postural control as well as ability to compensate for deficits.  He requires min assist for transfers and is able to ambulate 25 to 100 feet at min assist with PRW and R-AFO.  Patient and girlfriend educated that ambulation to be limited with therapy at this time due to safety concerns.  Disposition: 01-Home or Self Care  Diet: Heart Healthy  Special Instructions:  1. Return to work to be decided on follow up. 2. No driving till cleared by MD.  3. [facility]--July 9 th at 1:30 pm 4. Call [facility] for follow up testing in a few weeks.          Future  Appointments Provider Department Dept Phone   10/31/2012 2:00 PM Colonel Bald, [facility] [phone]   10/31/2012 3:30 PM Amy Aleatha Borer, [facility] [phone]   11/12/2012 1:45 PM Ronnald Nian, MD [facility] [phone]   12/07/2012 11:00 AM Erick Colace, [facility] [phone]       Medication List    STOP taking these medications       sildenafil 100 MG tablet  Commonly known as:  VIAGRA      TAKE these medications       aspirin 325 MG EC tablet  Take 1 tablet (325 mg total) by mouth daily.     baclofen 20 MG tablet  Commonly known as:  LIORESAL  Take 1 tablet (20 mg total) by mouth 4 (four) times daily.     clonazePAM 2 MG tablet  Commonly known as:  KLONOPIN  Take 2 mg by mouth at bedtime.     diphenhydrAMINE 25 MG tablet  Commonly known as:  BENADRYL  Take 50 mg by mouth every 6 (six) hours as needed for itching.     doxycycline 100 MG tablet  Commonly known as:  VIBRA-TABS  Take 1 tablet (100 mg total) by mouth every 12 (twelve) hours.     losartan 25 MG tablet  Commonly known as:  COZAAR  Take 1 tablet (25 mg total) by mouth daily.     rOPINIRole 1 MG tablet  Commonly known as:  REQUIP  Take 1 mg by mouth at bedtime.     zolpidem 5 MG tablet  Commonly known as:  AMBIEN  Take 1 tablet (5 mg total) by mouth at bedtime.       Follow-up Information   Follow up with Erick Colace, MD On 12/07/2012. (be there at 10:30 am  for 11 am  appointment )    Contact information:   [address] [place] [address] [phone]       Follow up with Gates Rigg, MD. Call today. (for follow up in 6 weeks. )    Contact information:   [address] [place] [address] [phone]       Follow up with [name], MD On 11/12/2012. (APPT: 1;30 PM)    Contact information:    [address] [place] [address] [phone]       Signed: Jacquelynn Cree 10/29/2012, 3:59 PM

## 2012-10-29 NOTE — Progress Notes (Signed)
Speech Language Pathology Daily Session Note & Discharge Summary  Patient Details  Name: Micheal Gonzalez MRN: [id number] Date of Birth: Sep 15, 1960  Today's Date: 10/29/2012 Time: 1120-1130 Time Calculation (min): 10 min  Short Term Goals: Week 2: SLP Short Term Goal 1 (Week 2): Patient will perform oral motor exercises with Modified Independence. SLP Short Term Goal 1 - Progress (Week 2): Met SLP Short Term Goal 2 (Week 2): Patient will identify compensatory strategies to increase intelligible speech Modified Independence. SLP Short Term Goal 2 - Progress (Week 2): Met SLP Short Term Goal 3 (Week 2): Patient will utlize compensatory strategies to increase intelligible speech at the conversation level with Supervision level cues from clinician or significant other.   SLP Short Term Goal 3 - Progress (Week 2): Met  Skilled Therapeutic Interventions: Skilled treatment session focused on completing patient and caregiver education.  SLP facilitated session by requesting patient identify speech intelligibly strategies and identify tasks that he can incorporate use of these strategies into at home.  Patient reported that he is able to perform oral-motor exercises and requires occasional cues to obtain accuracy greater than 90%.  Education and goals are complete.     FIM:  Comprehension Comprehension Mode: Auditory Comprehension: 7-Follows complex conversation/direction: With no assist Expression Expression Mode: Verbal Expression: 6-Expresses complex ideas: With extra time/assistive device Social Interaction Social Interaction: 6-Interacts appropriately with others with medication or extra time (anti-anxiety, antidepressant). Problem Solving Problem Solving: 6-Solves complex problems: With extra time Memory Memory: 7-Complete Independence: No helper  Pain Pain Assessment Pain Assessment: No/denies pain Pain Score: 0-No pain Patients Stated Pain Goal: 0 Multiple Pain Sites:  No  Therapy/Group: Individual Therapy   Speech Language Pathology Discharge Summary  Patient Details  Name: [name] MRN: [id number] Date of Birth: [birth year]  Today's Date: 10/29/2012  Patient has met 1 of 1 long term goals.  Patient to discharge at overall Modified Independent level.  Reasons goals not met: /a   Clinical Impression/Discharge Summary: Patient met 1 out of 1 long term goals during Cir stay due to gains in ability to perform oral motor exercises, identify effective speech intelligibility strategies and use them in conversation with 90% accuracy.  Patient demonstrates distortions with /s/ phoneme and s-blends however; this is baseline per patient report.  Patient's fatigue and ability to clearly articulate for long periods of time continues to impact his ability to return to work and as a result, it is recommended that he continue skilled SLP service in the next level of care to maximize communication abilities.    Care Partner:  Caregiver Able to Provide Assistance: Yes  Type of Caregiver Assistance: Physical  Recommendation:  Outpatient SLP;24 hour supervision/assistance  Rationale for SLP Follow Up: Maximize functional communication   Equipment: none   Reasons for discharge: Treatment goals met;Discharged from hospital   Patient/Family Agrees with Progress Made and Goals Achieved: Yes   See FIM for current functional status  Charlane Ferretti., CCC-SLP [phone]  Chealsey Miyamoto 10/29/2012, 11:46 AM

## 2012-10-29 NOTE — Progress Notes (Signed)
Patient ID: Micheal Gonzalez, male   DOB: October 24, 1960, 52 y.o.   MRN: [id number] Subjective/Complaints: 52 y.o. male admitted on 10/09/12 with right sided numbness with RLE Heaviness as well as lightheadedness. 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 2D echo with EF 55-60%. Carotid dopplers without ICA stenosis. 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 PT/OT evaluations pending. Patient with right hemiparesis with sensory deficits and difficulty with mobility--did have worsening of symptoms yesterday post BP meds. Neurology feels patient with progression and/or hypoperfusion. RUE remains flaccid  Spsms improved, was sedated on 20mg  Baclofen during the day, now on 10,10,20,20mg  dosing No new issues,pt is c/o spasms at night RUE flexor, RLE extensor  Review of Systems  Neurological:       Spasms RLE   Objective: Vital Signs: Blood pressure 100/67, pulse 62, temperature 97.1 F (36.2 C), temperature source Oral, resp. rate 18, height 6\' 1"  (1.854 m), weight 97.8 kg (215 lb 9.8 oz), SpO2 67.00%. No results found. Results for orders placed during the hospital encounter of 10/12/12 (from the past 72 hour(s))  BASIC METABOLIC PANEL     Status: Abnormal   Collection Time    10/26/12  2:45 PM      Result Value Range   Sodium 137  135 - 145 mEq/L   Potassium 3.6  3.5 - 5.1 mEq/L   Chloride 101  96 - 112 mEq/L   CO2 24  19 - 32 mEq/L   Glucose, Bld 141 (*) 70 - 99 mg/dL   BUN 10  6 - 23 mg/dL   Creatinine, Ser 4.09  0.50 - 1.35 mg/dL   Calcium 9.4  8.4 - 81.1 mg/dL   GFR calc non Af Amer 70 (*) >90 mL/min   GFR calc Af Amer 81 (*) >90 mL/min   Comment:            The eGFR has been calculated     using the CKD EPI equation.     This calculation has not been     validated in all clinical     situations.     eGFR's persistently   <90 mL/min signify     possible Chronic Kidney Disease.    Constitutional: He is oriented to person, place, and time. He appears well-developed and well-nourished.  HENT: oral mucosa pink and moist  Head: Normocephalic and atraumatic.  Eyes: Pupils are equal, round, and reactive to light.  Neck: Normal range of motion. Neck supple.  Cardiovascular: Normal rate and regular rhythm. No murmurs or gallops  No murmur heard.  Pulmonary/Chest: Effort normal and breath sounds normal. No respiratory distress.  Abdominal: Soft. Bowel sounds are normal. Non tender non distended Musculoskeletal: He exhibits no edema and no tenderness.  Neurological: He is alert and oriented to person, place, and time.  Mild right facial weakness with minimal dysarthria. Follows commands without difficulty. Right biceps Ashworth 2 Skin: Skin is warm and dry.  Psychiatric: He has a normal mood and affect. His behavior is normal. Thought content normal.  Right central 7 with dysarthric speech. Right pec, biceps 2-/5. 0/5 with trice, wrist, HI, RLE is 3-/5 with HF and KE, 0/5 with ADF and APF.Marland Kitchen No resting tone. DTR's 2-3+ on Right side no clonus. Cognitively appropriate.    Assessment/Plan: 1. Functional deficits secondary to Right hemiparesis, L PLIC infarct  Stable for D/C today F/u PCP  in 1-2 weeks F/u PM&R 6 weeks Outpt [facility] to start 7/9 See D/C summary See D/C instructions  FIM: FIM - Bathing Bathing Steps Patient Completed: Chest;Right Arm;Left Arm;Abdomen;Front perineal area;Buttocks;Right upper leg;Left upper leg;Right lower leg (including foot);Left lower leg (including foot) Bathing: 4: Steadying assist  FIM - Upper Body Dressing/Undressing Upper body dressing/undressing steps patient completed: Thread/unthread right sleeve of pullover shirt/dresss;Thread/unthread left sleeve of pullover shirt/dress;Pull shirt over trunk;Put head through opening of pull over shirt/dress Upper body  dressing/undressing: 5: Set-up assist to: Obtain clothing/put away FIM - Lower Body Dressing/Undressing Lower body dressing/undressing steps patient completed: Thread/unthread right underwear leg;Thread/unthread left underwear leg;Pull underwear up/down;Thread/unthread right pants leg;Thread/unthread left pants leg;Pull pants up/down;Don/Doff right sock;Don/Doff left sock;Don/Doff left shoe Lower body dressing/undressing: 4: Min-Patient completed 75 plus % of tasks  FIM - Toileting Toileting steps completed by patient: Adjust clothing after toileting;Performs perineal hygiene;Adjust clothing prior to toileting Toileting Assistive Devices: Grab bar or rail for support Toileting: 4: Steadying assist  FIM - Diplomatic Services operational officer Devices: Grab bars Toilet Transfers: 4-To toilet/BSC: Min A (steadying Pt. > 75%);4-From toilet/BSC: Min A (steadying Pt. > 75%)  FIM - Bed/Chair Transfer Bed/Chair Transfer Assistive Devices: Arm rests Bed/Chair Transfer: 5: Bed > Chair or W/C: Supervision (verbal cues/safety issues);5: Chair or W/C > Bed: Supervision (verbal cues/safety issues)  FIM - Locomotion: Wheelchair Distance: 150 Locomotion: Wheelchair: 6: Travels 150 ft or more, turns around, maneuvers to table, bed or toilet, negotiates 3% grade: maneuvers on rugs and over door sills independently FIM - Locomotion: Ambulation Locomotion: Ambulation Assistive Devices: Walker - Rolling;Walker - Platform;Orthosis Ambulation/Gait Assistance: 3: Mod assist Locomotion: Ambulation: 2: Travels 50 - 149 ft with moderate assistance (Pt: 50 - 74%)  Comprehension Comprehension Mode: Auditory Comprehension: 7-Follows complex conversation/direction: With no assist  Expression Expression Mode: Verbal Expression: 5-Expresses complex 90% of the time/cues < 10% of the time  Social Interaction Social Interaction: 6-Interacts appropriately with others with medication or extra time (anti-anxiety,  antidepressant).  Problem Solving Problem Solving: 6-Solves complex problems: With extra time  Memory Memory: 7-Complete Independence: No helper  Medical Problem List and Plan:  1. DVT Prophylaxis/Anticoagulation: Pharmaceutical: Lovenox  2. Pain Management: N/A  3. Mood: Provide ego support. Mild underlying anxiety with recent progression. LCSW to follow for evaluation.  4. Neuropsych: This patient is capable of making decisions on his own behalf.  5. HTN: Will monitor with bid checks. Use meds prn SBP >180 or DBP>110. Allow BP to run a little high for now--is still elevated in a week to add medication per neuro.  6. RLS: Chronic , Will continue klonopin and Requip. he's had more problems sleeping since stroke.  7. Hyperlipidemia: On statin to get LDL <100.  8. Insomnia:Has spasms that interfere with sleep, developing increased biceps spasticity, baclofen  -klonopin and ambien qhs   LOS (Days) 17 A FACE TO FACE EVALUATION WAS PERFORMED  KIRSTEINS,ANDREW E 10/29/2012, 9:52 AM

## 2012-10-29 NOTE — Progress Notes (Signed)
Physical Therapy Discharge Summary  Patient Details  Name: Micheal Gonzalez MRN: [id number] Date of Birth: 04/21/1961  Today's Date: 10/29/2012 Time: 1000-1100 Time Calculation (min): 60 min  Patient has met 11 of 12 long term goals due to improved balance, improved postural control, increased strength, ability to compensate for deficits, functional use of  right upper extremity and right lower extremity, improved attention and improved awareness.  Patient to discharge at a wheelchair level Min Assist for squat pivot transfers; modified independent w/c propulsion x 150' home setting, supervision x 200' community.   Patient's care partner is independent to provide the necessary physical assistance for squat pivot transfers at discharge. Pt and girlfriend agreed that ambulation would occur after d/c only with therapists in OPPT,  until pt has progressed, and girlfriend was trained by a PT to ambulate with him.  Reasons goals not met: pt chose to d/c early before planned d/c date  Recommendation:  Patient will benefit from ongoing skilled PT services in outpatient setting to continue to advance safe functional mobility, address ongoing impairments in RLe motor control, balance, activity tolerance, functional mobility, and minimize fall risk.  Equipment: IT trainer w/c, R Blue Rocker AFO, RW with R platform  Reasons for discharge: discharge from hospital  Patient/family agrees with progress made and goals achieved: Yes  PT Discharge Precautions/Restrictions Precautions Precautions: Fall   Pain Pain Assessment Pain Assessment: No/denies pain Vision/Perception - no change from baseline    Cognition Overall Cognitive Status: Within Functional Limits for tasks assessed Sensation Sensation Light Touch: Impaired by gross assessment Light Touch Impaired Details: Impaired RUE;Impaired RLE Stereognosis: Impaired by gross assessment Hot/Cold: Appears Intact Proprioception:intact  RLE  gross assessment Coordination Gross Motor Movements are Fluid and Coordinated: No Fine Motor Movements are Fluid and Coordinated: No Motor  Motor Motor: Hemiplegia Motor - Discharge Observations: without isolated movements in RLE  Mobility Bed Mobility Bed Mobility:  (modified independent for all) Transfers Sit to Stand: 5: Supervision Sit to Stand Details: Verbal cues for technique Stand to Sit Details (indicate cue type and reason): Verbal cues for technique Stand Pivot Transfers: 4: Min guard Stand Pivot Transfer Details: Verbal cues for technique Stand Pivot Transfer Details (indicate cue type and reason): pt requires min guard assist for stand/pivot, close supervision for squat pivot Locomotion  Ambulation Ambulation: Yes Ambulation/Gait Assistance: 4: Min assist Ambulation Distance (Feet): 70 Feet Assistive device: Right platform walker;Rolling walker Ambulation/Gait Assistance Details: Manual facilitation for placement;Verbal cues for technique Ambulation/Gait Assistance Details: assistance needed primarily for R foot position; therapist's foot between pt's feet, to prevent strong RLE adduction during swing phase Gait Gait: Yes Gait Pattern: Impaired Gait Pattern: Step-through pattern;Decreased stride length;Decreased step length - right;Decreased weight shift to right;Decreased hip/knee flexion - right;Decreased dorsiflexion - right;Decreased trunk rotation;Narrow base of support;Trunk rotated posteriorly on right;Decreased step length - left Gait velocity: decreased Stairs / Additional Locomotion Stairs: Yes Stairs Assistance: 4: Min guard Stairs Assistance Details: Verbal cues for technique Stairs Assistance Details (indicate cue type and reason): pt is able to position R foot on stairs with increased time for limiting adduction Stair Management Technique: One rail Left Number of Stairs: 5 Height of Stairs: 7 Wheelchair Mobility Wheelchair Mobility:  Yes Wheelchair Assistance: 6: Modified independent (Device/Increase time);5: Supervision (see Distance below) Wheelchair Propulsion: Left upper extremity;Left lower extremity Wheelchair Parts Management: Supervision/cueing Distance: 150 modified independent in controlled env; 200' supervision in community setting  Trunk/Postural Assessment  Cervical Assessment Cervical Assessment: Within Functional Limits Thoracic Assessment Thoracic  Assessment: Within Functional Limits Lumbar Assessment Lumbar Assessment: Within Functional Limits Postural Control Postural Control: Deficits on evaluation Postural Limitations: decreased weight shifting to right and protective response  Balance Balance Balance Assessed: Yes Static Sitting Balance Static Sitting - Level of Assistance: 5: Stand by assistance Dynamic Sitting Balance Dynamic Sitting - Level of Assistance: 5: Stand by assistance Static Standing Balance Static Standing - Level of Assistance: 4: Min assist Extremity Assessment      RLE Assessment RLE Assessment:  (slightly increased tone) RLE Strength RLE Overall Strength Comments: hip flexion/ grossly 3+/5; hip extension and adduction 2-/5; hip abduction occasionally 1+/5; /5 in standing; knee extension 3-/5; knee flexion 2-/5; ankle DF 1/5; PF 2+/5 LLE Assessment LLE Assessment: Within Functional Limits  See FIM for current functional status  Treatment today: family ed with pt's girlfriend Micheal Gonzalez, for : squat pivot basic transfers, squat pivot car transfers, w/c parts management, and management of w/c up/down 4" high threshold as per home entry  Micheal Gonzalez 10/29/2012, 4:32 PM

## 2012-10-29 NOTE — Progress Notes (Signed)
Occupational Therapy Session Note  Patient Details  Name: Micheal Gonzalez MRN: [id number] Date of Birth: 1960/12/31  Today's Date: 10/29/2012 Time: 0900-1000 Time Calculation (min): 60 min  Short Term Goals: Week 1:  OT Short Term Goal 1 (Week 1): Pt. will groom self at sink in standing with minimal assist OT Short Term Goal 1 - Progress (Week 1): Met OT Short Term Goal 2 (Week 1): Pt. will use RUE as stabilizer with mod assist. OT Short Term Goal 2 - Progress (Week 1): Met OT Short Term Goal 3 (Week 1): Pt. transfer to tub/shower with minimal assist OT Short Term Goal 3 - Progress (Week 1): Met OT Short Term Goal 4 (Week 1): Pt. will dress self with minimal assist OT Short Term Goal 4 - Progress (Week 1): Met OT Short Term Goal 5 (Week 1): Pt. will transfer to toilet with minimal assist.   OT Short Term Goal 5 - Progress (Week 1): Not met Week 2:  OT Short Term Goal 1 (Week 2): Pt will complete stand pivot tub transfer with min assist  OT Short Term Goal 1 - Progress (Week 2): Met OT Short Term Goal 2 (Week 2): Pt will complete stand pivot toilet transfer with min assist  OT Short Term Goal 2 - Progress (Week 2): Met OT Short Term Goal 3 (Week 2):  Pt will use RUE as stabilizer during grooming task with 1 verbal cue OT Short Term Goal 3 - Progress (Week 2): Met OT Short Term Goal 4 (Week 2): Pt will complete LB dressing with close supervision for standing balance for management around waist  OT Short Term Goal 4 - Progress (Week 2): Progressing toward goal OT Short Term Goal 5 (Week 2): Pt will complete 2 grooming tasks in standing with close supervision OT Short Term Goal 5 - Progress (Week 2): Progressing toward goal Week 3:  OT Short Term Goal 1 (Week 3): STGs = LTGs  Skilled Therapeutic Interventions/Progress Updates:    Pt seen for caregiver training with his girlfriend during ADL retraining of bathing and dressing in tub room using tub bench.  Pt's girlfriend provided with  written hand out on squat pivot transfer and written instructions to not ambulate pt or complete stand pivot transfers, only squat pivot transfers. His GF was actively involved in ADL session,  Completing all assistance pt required and assistance with transfers.  She assisted donning his right AFO and shoe.  Discussed HHOT recommendations and that she can work on bathroom transfers with HHOT.  A/AROM for RUE on a kitchen table was demonstrated by patient and GF was instructed on how to do this as a home exercise.  Pt and GF felt prepared for home and stated that they understood precautions with only using squat pivot transfers.  Pt will be going home today.  Therapy Documentation Precautions:  Precautions Precautions: Fall Precaution Comments:  (decreased balance; right sided weakness) Restrictions Weight Bearing Restrictions: No   Pain: Pain Assessment Pain Assessment: No/denies pain Pain Score: 0-No pain Patients Stated Pain Goal: 0 Multiple Pain Sites: No ADL:  See FIM for current functional status  Therapy/Group: Individual Therapy  Micheal Gonzalez 10/29/2012, 11:39 AM

## 2012-10-29 NOTE — Progress Notes (Signed)
Occupational Therapy Discharge Summary  Patient Details  Name: Micheal Gonzalez MRN: [id number] Date of Birth: 05/09/1960  Today's Date: 10/29/2012  Patient has met 4 of 9 long term goals due to improved balance, postural control, ability to compensate for deficits and functional use of  RIGHT upper and RIGHT lower extremity.  Patient to discharge at [facility] Assist level.  Patient's care partner is independent to provide the necessary physical assistance at discharge.    Reasons goals not met: Pt opted to go home early, several days prior to planned discharge.  The amount of time for caregiver training was limited so training consisted of only squat pivot transfers as opposed to ambulation with platform walker or stand pivots.  Due to decreased balance he continues to require min to steady assist with lower body ADLs and steady assist to guide his trunk in squat pivot transfers.  Recommendation:  Patient will benefit from ongoing skilled OT services in home health setting to continue to advance functional skills in the area of BADL.  Equipment: tub transfer bench and BSC  Reasons for discharge: Pt chose to discharge early.  Patient/family agrees with progress made and goals achieved: Yes  OT Discharge ADL  Refer to FIM Vision/Perception  Vision - History Patient Visual Report: No change from baseline Vision - Assessment Eye Alignment: Within Functional Limits Visual Fields: No apparent deficits Perception Perception: Within Functional Limits Praxis Praxis: Intact  Cognition Overall Cognitive Status: Within Functional Limits for tasks assessed Orientation Level: Oriented X4 Sensation Sensation Light Touch: Impaired by gross assessment Light Touch Impaired Details: Impaired RUE;Impaired RLE Stereognosis: Impaired by gross assessment Hot/Cold: Appears Intact Proprioception: Impaired by gross assessment Coordination Gross Motor Movements are Fluid and Coordinated:  No Fine Motor Movements are Fluid and Coordinated: No Motor  Motor Motor: Hemiplegia Motor - Skilled Clinical Observations: developing movement in RUE/RLE Mobility    Refer to FIM  Trunk/Postural Assessment  Cervical Assessment Cervical Assessment: Within Functional Limits Thoracic Assessment Thoracic Assessment: Within Functional Limits Lumbar Assessment Lumbar Assessment: Within Functional Limits Postural Control Postural Limitations: decreased weight shifting to right and protective response  Balance Static Sitting Balance Static Sitting - Level of Assistance: 5: Stand by assistance Dynamic Sitting Balance Dynamic Sitting - Level of Assistance: 5: Stand by assistance Static Standing Balance Static Standing - Level of Assistance: 4: Min assist Extremity/Trunk Assessment RUE AROM (degrees) Overall AROM Right Upper Extremity: Deficits;Other (comment) (RUE hemiplegia with minimal scapular retraction, elbow flexi) LUE Assessment LUE Assessment: Within Functional Limits  See FIM for current functional status  Jaceion Aday 10/29/2012, 12:23 PM

## 2012-10-29 NOTE — Progress Notes (Signed)
Patient and friend received verbal and written discharge instructions from Marissa Nestle, [place]. Reviewed follow up appointments and medications, both deny any questions or concerns. Personal belongings packed by friend and patient. Patient taken down to private vehicle by NT via wheelchair. [place], Herlinda Heady Elon Jester

## 2012-10-29 NOTE — Progress Notes (Signed)
Social Work Discharge Note Discharge Note  The overall goal for the admission was met for:   Discharge location: Yes-HOME WITH FINANCE-JAMILA-24 HOUR CARE  Length of Stay: Yes-16 DAYS  Discharge activity level: Yes-MIN W/C LEVEL-AMBULATION WITH THERAPIST ONLY  Home/community participation: Yes  Services provided included: MD, RD, PT, OT, SLP, RN, TR, Pharmacy, Neuropsych and SW  Financial Services: Private Insurance: [facility]  Follow-up services arranged: Outpatient: [facility]-PT & OT 7/9 1;30-2;45 3;30-4;15 45 MIN BREAK IN BETWEEN and DME: [facility]-WHEELCHAIR, BSC, TUB BENCH,PLAT FORM ROLLING WALKER  Comments (or additional information):FAMILY EDUCATION COMPLETED TODAY AND BOTH COMFORTABLE WITH HIS CARE, PT REQUESTING TO LEAVE EARLY DUE TO NOT SLEEPING WELL HERE. ORIGINAL DISCHARGE DATE 7/10  Patient/Family verbalized understanding of follow-up arrangements: Yes  Individual responsible for coordination of the follow-up plan: SELF & JAMILA-FINANCE  Confirmed correct DME delivered: Lucy Chris 10/29/2012    Lucy Chris

## 2012-10-31 ENCOUNTER — Ambulatory Visit: Payer: 59 | Admitting: Physical Therapy

## 2012-10-31 ENCOUNTER — Ambulatory Visit: Payer: 59 | Attending: Physical Medicine & Rehabilitation | Admitting: Occupational Therapy

## 2012-10-31 DIAGNOSIS — R269 Unspecified abnormalities of gait and mobility: Secondary | ICD-10-CM | POA: Insufficient documentation

## 2012-10-31 DIAGNOSIS — R4189 Other symptoms and signs involving cognitive functions and awareness: Secondary | ICD-10-CM | POA: Insufficient documentation

## 2012-10-31 DIAGNOSIS — Z5189 Encounter for other specified aftercare: Secondary | ICD-10-CM | POA: Insufficient documentation

## 2012-10-31 DIAGNOSIS — M6281 Muscle weakness (generalized): Secondary | ICD-10-CM | POA: Insufficient documentation

## 2012-10-31 DIAGNOSIS — I69959 Hemiplegia and hemiparesis following unspecified cerebrovascular disease affecting unspecified side: Secondary | ICD-10-CM | POA: Insufficient documentation

## 2012-11-02 ENCOUNTER — Telehealth: Payer: Self-pay | Admitting: Family Medicine

## 2012-11-02 NOTE — Telephone Encounter (Signed)
PT HAS AN APPT TO SEE YOU ON THE 21ST. HE DROPPED OFF PARKING PLACARD TO BE FILLED OUT BEFORE THEN. CALL WHEN READY. I AM SENDING BACK IN FOLDER

## 2012-11-04 ENCOUNTER — Emergency Department (INDEPENDENT_AMBULATORY_CARE_PROVIDER_SITE_OTHER)
Admission: EM | Admit: 2012-11-04 | Discharge: 2012-11-04 | Disposition: A | Discharge status: Home / Self Care | Payer: 59 | Source: Home / Self Care

## 2012-11-04 ENCOUNTER — Encounter (HOSPITAL_COMMUNITY): Payer: Self-pay | Admitting: *Deleted

## 2012-11-04 DIAGNOSIS — M791 Myalgia, unspecified site: Secondary | ICD-10-CM

## 2012-11-04 DIAGNOSIS — IMO0001 Reserved for inherently not codable concepts without codable children: Secondary | ICD-10-CM

## 2012-11-04 DIAGNOSIS — M79601 Pain in right arm: Secondary | ICD-10-CM

## 2012-11-04 DIAGNOSIS — M79609 Pain in unspecified limb: Secondary | ICD-10-CM

## 2012-11-04 HISTORY — DX: Essential (primary) hypertension: I10

## 2012-11-04 MED ORDER — TRAMADOL HCL 50 MG PO TABS: 50.0000 mg | ORAL_TABLET | Freq: Four times a day (QID) | ORAL | Status: DC | PRN

## 2012-11-04 NOTE — ED Notes (Signed)
Patient complains of right sided shoulder pain; history of CVA with right sided hemiparesis.

## 2012-11-04 NOTE — ED Notes (Signed)
Assessed pt from [facility]; reports being discharged from hospital this past week following a CVA few wks ago; c/o right shoulder pain.  Right shoulder is painful with particular movements.  Denies any new weakness or parasthesias or any other c/o's.  Has residual right-sided hemiparesis.

## 2012-11-04 NOTE — ED Provider Notes (Signed)
History    CSN: [id number] Arrival date & time 11/04/12  1157  First MD Initiated Contact with Patient 11/04/12 1248     Chief Complaint  Patient presents with  . Shoulder Pain   (Consider location/radiation/quality/duration/timing/severity/associated sxs/prior Treatment) HPI Comments: 52 year old male presents with complaints of right shoulder pain. 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 He states the pain in the right shoulder started around 3 days ago. Examination reveals the pain is not in the shoulder that in the lateral aspect of the upper arm involving the distal deltoid muscle.  Past Medical History  Diagnosis Date  . Allergy   . ED (erectile dysfunction)   . RLS (restless legs syndrome)   . Folliculitis barbae   . Insomnia   . Insomnia   . Hypertension    Past Surgical History  Procedure Laterality Date  . Colonoscopy  03/25/11   Family History  Problem Relation Age of Onset  . Cancer Mother 58    Breast cancer  . Heart disease Mother 11    CABG  . Cancer Sister 87    Colon Ca  . Stroke Sister 79   History  Substance Use Topics  . Smoking status: Never Smoker   . Smokeless tobacco: Never Used  . Alcohol Use: 1.0 oz/week    2 drink(s) per week     Comment: 2 drink per week.    Review of Systems  Constitutional: Negative.   Respiratory: Negative.   Cardiovascular: Negative.   Gastrointestinal: Negative.   Genitourinary: Negative.   Musculoskeletal: Positive for myalgias.       As per HPI  Skin: Negative.   Neurological: Negative for dizziness, speech difficulty, weakness, numbness and headaches.       Right hemiplegia status post CVA    Allergies  Review of patient's allergies indicates no known allergies.  Home Medications   Current Outpatient Rx  Name  Route  Sig  Dispense  Refill  . aspirin EC 325 MG EC tablet   Oral   Take 1 tablet (325 mg total) by mouth daily.   30 tablet   0   . baclofen (LIORESAL) 20 MG tablet   Oral    Take 1 tablet (20 mg total) by mouth 4 (four) times daily.   90 each   1   . clonazePAM (KLONOPIN) 2 MG tablet   Oral   Take 2 mg by mouth at bedtime.         . diphenhydrAMINE (BENADRYL) 25 MG tablet   Oral   Take 50 mg by mouth every 6 (six) hours as needed for itching.         Marland Kitchen doxycycline (VIBRA-TABS) 100 MG tablet   Oral   Take 1 tablet (100 mg total) by mouth every 12 (twelve) hours.   15 tablet   0   . losartan (COZAAR) 25 MG tablet   Oral   Take 1 tablet (25 mg total) by mouth daily.   30 tablet   1   . rOPINIRole (REQUIP) 1 MG tablet   Oral   Take 1 mg by mouth at bedtime.          Marland Kitchen zolpidem (AMBIEN) 5 MG tablet   Oral   Take 1 tablet (5 mg total) by mouth at bedtime.   30 tablet   0   . traMADol (ULTRAM) 50 MG tablet   Oral   Take 1 tablet (50 mg total) by mouth every 6 (six)  hours as needed for pain.   15 tablet   0    BP 139/73  Pulse 48  Temp(Src) 98 F (36.7 C) (Oral)  Resp 15  SpO2 99% Physical Exam  Nursing note and vitals reviewed. Constitutional: He is oriented to person, place, and time. He appears well-developed and well-nourished.  HENT:  Head: Normocephalic and atraumatic.  Eyes: EOM are normal.  Neck: Normal range of motion.  Cardiovascular: Regular rhythm and normal heart sounds.   Mild bradycardia. But no change since EKG of June 2014.  Pulmonary/Chest: Effort normal and breath sounds normal.  Musculoskeletal:  Tenderness over the right lateral upper arm correctly over the distal deltoid. There is a mobile tender or nodule in this location. This is the location of his pain. He is unable to move his arm but he can bring his shoulder backwards about 2 inches. No tenderness to the shoulder joint. Good passive range of motion. Passive abduction reveals pain in the right upper arm, lateral  bicep  Neurological: He is alert and oriented to person, place, and time. No cranial nerve deficit.  Skin: Skin is warm and dry.   Psychiatric: He has a normal mood and affect.    ED Course  Procedures (including critical care time) Labs Reviewed - No data to display No results found. 1. Pain in the muscles   2. Arm pain, lateral, right     MDM  Arms sling for support the next 2 days. He did not have to wear it at all times. Primarily while sitting in a wheelchair. Occasionally remove it to move the arm and shoulder around so [place] frozen shoulder. Apply heat to the upper outer right arm. Tramadol 50 mg every 6 hours when necessary pain. Followup with primary care doctor in PT for additional evaluation and treatment.  Hayden Rasmussen, NP 11/04/12 1340

## 2012-11-05 ENCOUNTER — Ambulatory Visit: Payer: 59 | Admitting: Occupational Therapy

## 2012-11-05 ENCOUNTER — Ambulatory Visit: Payer: 59

## 2012-11-05 NOTE — ED Provider Notes (Signed)
Medical screening examination/treatment/procedure(s) were performed by resident physician or non-physician practitioner and as supervising physician I was immediately available for consultation/collaboration.   Kalenna Millett DOUGLAS MD.   Tahjae Durr D Cheris Tweten, MD 11/05/12 1340 

## 2012-11-07 ENCOUNTER — Ambulatory Visit: Payer: 59

## 2012-11-07 ENCOUNTER — Ambulatory Visit: Payer: 59 | Admitting: Occupational Therapy

## 2012-11-08 ENCOUNTER — Telehealth: Payer: Self-pay | Admitting: Internal Medicine

## 2012-11-08 NOTE — Telephone Encounter (Signed)
Write him a letter stating he had a CVA and is unable to travel

## 2012-11-08 NOTE — Telephone Encounter (Signed)
Pt needs it fax to [phone]

## 2012-11-08 NOTE — Telephone Encounter (Signed)
Pt states that he paid for a vacation through [employer] but needs a letter stating he can not travel due to the stroke.

## 2012-11-09 ENCOUNTER — Telehealth: Payer: Self-pay | Admitting: Family Medicine

## 2012-11-12 ENCOUNTER — Other Ambulatory Visit: Payer: Self-pay

## 2012-11-12 ENCOUNTER — Ambulatory Visit (INDEPENDENT_AMBULATORY_CARE_PROVIDER_SITE_OTHER): Payer: 59 | Admitting: Family Medicine

## 2012-11-12 ENCOUNTER — Encounter: Payer: Self-pay | Admitting: Family Medicine

## 2012-11-12 VITALS — BP 110/70 | HR 66

## 2012-11-12 DIAGNOSIS — I10 Essential (primary) hypertension: Secondary | ICD-10-CM

## 2012-11-12 DIAGNOSIS — I639 Cerebral infarction, unspecified: Secondary | ICD-10-CM

## 2012-11-12 DIAGNOSIS — I6350 Cerebral infarction due to unspecified occlusion or stenosis of unspecified cerebral artery: Secondary | ICD-10-CM

## 2012-11-12 MED ORDER — LOSARTAN POTASSIUM 25 MG PO TABS: 25.0000 mg | ORAL_TABLET | Freq: Every day | ORAL | Status: DC

## 2012-11-12 NOTE — Progress Notes (Signed)
  Subjective:    Patient ID: Micheal Gonzalez, male    DOB: 1960-08-01, 52 y.o.   MRN: [id number]  HPI He is here for followup after recent hospitalization and subsequent rehabilitation. The hospital record was reviewed and did show a CT scan as well as 2 MRIs done while in the hospital. He is having a residual right facial numbness, right arm and leg weakness. He was sent home from rehabilitation on the 20th. He is apparently going back and forth to rehabilitation however transportation right now is provided by a friend. He apparently did not arrange for any home health visit to assess his needs. He did run out of his blood pressure medication. He has concerns over his emergency room visit. Apparently he did wait several hours before being seen and then when he did get taken back into the emergency department, he stated he waited several more hours. He also then stated he overheard the doctor talking to the nurse about inappropriate use of a medication for his blood pressure . Apparently he was then given another MRI. Review of Systems     Objective:   Physical Exam Joneen Boers and in no distress. Slight right-sided facial weakness is noted. Right arm and leg were not evaluated.       Assessment & Plan:  CVA (cerebral infarction) - Plan: Ambulatory referral to Home Health  HTN (hypertension) - Plan: losartan (COZAAR) 25 MG tablet  over 45 minutes spent discussing his overall care. They have concerns over his care in the emergency room and whether this should have been done more expeditiously. We also discussed his visit to rehabilitation and being sent home. I will arrange for home health. His blood pressure med was renewed. We will also help arrange for him to get a neurology appointment. He is to come back here in 2 months.

## 2012-11-12 NOTE — Telephone Encounter (Signed)
SENT IN B/P MEDS 

## 2012-11-12 NOTE — Telephone Encounter (Signed)
PT HAD APPT TODAY 

## 2012-11-13 ENCOUNTER — Telehealth: Payer: Self-pay | Admitting: Family Medicine

## 2012-11-13 ENCOUNTER — Ambulatory Visit: Payer: 59 | Admitting: Occupational Therapy

## 2012-11-13 ENCOUNTER — Ambulatory Visit: Payer: 59

## 2012-11-13 NOTE — Telephone Encounter (Signed)
Called pt and advised him that I had talked with Katherin Rine OT at [facility] and she states that in patient rehab usually sets up home health, but they transferred him to out pt rehab since he told them he had a care taker.  Natalia Leatherwood will talk with him today at his appt.  Caretaker is only at pt home temporarily and will be leaving.

## 2012-11-13 NOTE — Telephone Encounter (Signed)
Beather Arbour with physical therapy called and advised they saw patient today.  She states he has need of supervision.  Pt advised her that some of the folks from his church would be coming around to help some and his caregiver is leaving the home on Monday.    PT recommends we order home health for PT, OT and speech.  Patient is having cognitive deficits and may need assisted living down the road.  This was discussed with the patient.  Olegario Messier Rines requested when we order the Home Health that we make a note that "Patient is having change in caregiver at home and there is concern about home safety".

## 2012-11-14 NOTE — Telephone Encounter (Signed)
I have [facility] health eval per Dr.Laonde was faxed Monday they have 48hr or less turn around so pt should hear something today .

## 2012-11-15 ENCOUNTER — Ambulatory Visit: Payer: 59 | Admitting: Occupational Therapy

## 2012-11-15 ENCOUNTER — Ambulatory Visit: Payer: 59

## 2012-11-20 ENCOUNTER — Encounter: Payer: 59 | Admitting: Occupational Therapy

## 2012-11-20 ENCOUNTER — Ambulatory Visit: Payer: 59 | Admitting: Physical Therapy

## 2012-11-21 ENCOUNTER — Telehealth: Payer: Self-pay | Admitting: Family Medicine

## 2012-11-21 NOTE — Telephone Encounter (Signed)
I don't see any issue with him traveling with assistance.

## 2012-11-21 NOTE — Telephone Encounter (Signed)
Pt advised word for word

## 2012-11-22 ENCOUNTER — Encounter: Payer: 59 | Admitting: Occupational Therapy

## 2012-11-22 ENCOUNTER — Ambulatory Visit: Payer: 59 | Admitting: Physical Therapy

## 2012-11-23 ENCOUNTER — Encounter: Payer: Self-pay | Admitting: Family Medicine

## 2012-11-26 ENCOUNTER — Encounter: Payer: 59 | Admitting: Occupational Therapy

## 2012-11-26 ENCOUNTER — Telehealth: Payer: Self-pay

## 2012-11-26 ENCOUNTER — Ambulatory Visit: Payer: 59 | Admitting: Physical Therapy

## 2012-11-26 NOTE — Telephone Encounter (Signed)
Called and left message with rebecha to please call me and let me know who is doing ot

## 2012-11-26 NOTE — Telephone Encounter (Signed)
DR.LALONDE HE WILL NOT SEE NEURO UNTIL 8/22 PLEASE ADVISE ON WHAT YOU WANT TO DO

## 2012-11-26 NOTE — Telephone Encounter (Signed)
Have OT give me an idea of his ability to function and return to work. Have them send me a note

## 2012-11-30 ENCOUNTER — Ambulatory Visit: Payer: 59 | Admitting: Physical Therapy

## 2012-11-30 ENCOUNTER — Encounter: Payer: 59 | Admitting: Occupational Therapy

## 2012-12-03 ENCOUNTER — Encounter: Payer: 59 | Admitting: Occupational Therapy

## 2012-12-03 ENCOUNTER — Ambulatory Visit: Payer: 59 | Admitting: Physical Therapy

## 2012-12-05 ENCOUNTER — Ambulatory Visit: Payer: 59 | Admitting: Physical Therapy

## 2012-12-05 ENCOUNTER — Encounter: Payer: 59 | Admitting: Occupational Therapy

## 2012-12-07 ENCOUNTER — Encounter: Payer: Self-pay | Admitting: Physical Medicine & Rehabilitation

## 2012-12-07 ENCOUNTER — Encounter: Payer: 59 | Attending: Physical Medicine & Rehabilitation

## 2012-12-07 ENCOUNTER — Ambulatory Visit (HOSPITAL_BASED_OUTPATIENT_CLINIC_OR_DEPARTMENT_OTHER): Payer: 59 | Admitting: Physical Medicine & Rehabilitation

## 2012-12-07 VITALS — BP 150/100 | HR 84 | Resp 16 | Ht 72.0 in | Wt 192.0 lb

## 2012-12-07 DIAGNOSIS — I69959 Hemiplegia and hemiparesis following unspecified cerebrovascular disease affecting unspecified side: Secondary | ICD-10-CM | POA: Insufficient documentation

## 2012-12-07 DIAGNOSIS — I6330 Cerebral infarction due to thrombosis of unspecified cerebral artery: Secondary | ICD-10-CM

## 2012-12-07 DIAGNOSIS — G8110 Spastic hemiplegia affecting unspecified side: Secondary | ICD-10-CM

## 2012-12-07 DIAGNOSIS — I69919 Unspecified symptoms and signs involving cognitive functions following unspecified cerebrovascular disease: Secondary | ICD-10-CM | POA: Insufficient documentation

## 2012-12-07 NOTE — Progress Notes (Signed)
Subjective:    Patient ID: Micheal Gonzalez, male    DOB: 11/15/1960, 52 y.o.   MRN: [id number]  HPI Micheal Gonzalez is a 52 y.o. male admitted on 10/09/12 with right sided numbness with RLE Heaviness as well as lightheadedness. 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 2D echo with EF 55-60%. Carotid dopplers without ICA stenosis  Health PT OT and speech twice a week at least each Followup with neurology scheduled Already has seen primary care physician Walks in home without cane  Pain Inventory Average Pain na Pain Right Now na My pain is na  In the last 24 hours, has pain interfered with the following? General activity 0 Relation with others 0 Enjoyment of life 0 What TIME of day is your pain at its worst? na Sleep (in general) Fair  Pain is worse with: na Pain improves with: na Relief from Meds: na  Mobility walk without assistance use a cane how many minutes can you walk? 20 ability to climb steps?  yes Do you have any goals in this area?  yes  Function employed # of hrs/week 40 not employed: date last employed 6-1 Do you have any goals in this area?  no  Neuro/Psych numbness  Prior Studies Any changes since last visit?  no  Physicians involved in your care Any changes since last visit?  no   Family History  Problem Relation Age of Onset  . Cancer Mother 37    Breast cancer  . Heart disease Mother 19    CABG  . Cancer Sister 30    Colon Ca  . Stroke Sister 75   History   Social History  . Marital Status: Unknown    Spouse Name: N/A    Number of Children: N/A  . Years of Education: N/A   Social History Main Topics  . Smoking status: Never Smoker   . Smokeless tobacco: Never Used  . Alcohol Use: 1.0 oz/week    2 drink(s) per week     Comment: 2 drink per week.  . Drug Use: No  .  Sexual Activity: Yes   Other Topics Concern  . None   Social History Narrative  . None   Past Surgical History  Procedure Laterality Date  . Colonoscopy  03/25/11   Past Medical History  Diagnosis Date  . Allergy   . ED (erectile dysfunction)   . RLS (restless legs syndrome)   . Folliculitis barbae   . Insomnia   . Insomnia   . Hypertension    BP 162/109  Pulse 84  Resp 16  Ht 6' (1.829 m)  Wt 192 lb (87.091 kg)  BMI 26.03 kg/m2  SpO2 95%     Review of Systems  Neurological: Positive for numbness.  All other systems reviewed and are negative.       Objective:   Physical Exam  Nursing note and vitals reviewed. Constitutional: He is oriented to person, place, and time. He appears well-developed and well-nourished.  HENT:  Head: Normocephalic and atraumatic.  Eyes: Conjunctivae and EOM are normal. Pupils are equal, round, and reactive to light.  Neck: Normal range of motion.  Neurological: He is alert and oriented to person, place, and time. No cranial nerve deficit or sensory deficit. Gait abnormal.  Ambulates with a cane and right AFO. No evidence of knee instability. There is occasional toe  drag on the right side.  Psychiatric: He has a normal mood and affect. His behavior is normal. Judgment and thought content normal.    Motor strength is 3 minus right biceps 2 minus in the finger flexors and finger extensors. 3 minus in the deltoid 3 minus hip flexor on the right 4 minus knee extensor trace ankle dorsiflexor. Sensation is equal on both sides Tone Ashworth grade 3 at the right biceps Ashworth grade 2 at the wrist flexors and finger flexion      Assessment & Plan:  1. Left internal capsule as well as left posterior temporal branch infarct causing right hemiparesis as well as mild cognitive deficits. 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 He is progressing with PT OT and speech therapy His right upper extremity function is limited by  his spasticity. He is already tried both inpatient and outpatient therapy as well as oral medications without much improvement. We discussed other treatment options including botulinum toxin. I think he would be a good candidate and he wishes to proceed with this We'll see him back in 3 weeks for the Botox injection 200 units right biceps right brachialis as well as potentially the brachioradialis Continue home health PT OT speech No driving for now. No return to work for now

## 2012-12-10 ENCOUNTER — Ambulatory Visit: Payer: 59 | Admitting: Physical Therapy

## 2012-12-10 ENCOUNTER — Encounter: Payer: 59 | Admitting: Occupational Therapy

## 2012-12-12 ENCOUNTER — Encounter: Payer: 59 | Admitting: Occupational Therapy

## 2012-12-12 ENCOUNTER — Ambulatory Visit: Payer: 59 | Admitting: Physical Therapy

## 2012-12-14 ENCOUNTER — Ambulatory Visit: Payer: 59 | Admitting: Neurology

## 2012-12-17 ENCOUNTER — Ambulatory Visit: Payer: 59 | Admitting: Physical Therapy

## 2012-12-17 ENCOUNTER — Encounter: Payer: 59 | Admitting: Occupational Therapy

## 2012-12-18 ENCOUNTER — Other Ambulatory Visit: Payer: Self-pay

## 2012-12-18 ENCOUNTER — Ambulatory Visit (INDEPENDENT_AMBULATORY_CARE_PROVIDER_SITE_OTHER): Payer: 59 | Admitting: Medical

## 2012-12-18 ENCOUNTER — Encounter: Payer: Self-pay | Admitting: Medical

## 2012-12-18 VITALS — BP 130/90 | HR 82 | Temp 98.1°F | Resp 16 | Wt 201.0 lb

## 2012-12-18 DIAGNOSIS — G4700 Insomnia, unspecified: Secondary | ICD-10-CM

## 2012-12-18 MED ORDER — BACLOFEN 20 MG PO TABS: 20.0000 mg | ORAL_TABLET | Freq: Four times a day (QID) | ORAL | Status: DC

## 2012-12-18 MED ORDER — ZOLPIDEM TARTRATE 5 MG PO TABS: 5.0000 mg | ORAL_TABLET | Freq: Every day | ORAL | Status: DC

## 2012-12-18 NOTE — Progress Notes (Signed)
Subjective:  Micheal Gonzalez is a 52 y.o. male who presents for sleep issues.  He has long hx/o insomnia.   Was recently hospitalized with stroke.  Was prescribed Ambien in the hospital.  Goes to bed around 11am, but usually doesn't fall asleep til close to 4am.   Has had ongoing sleep problems for years, has used Requp for RLS and Klonopin for years, but since the hospital visit for stroke, sleep problems seemed to worsen.  Every now and then Ambien helps.   Patient describes symptoms as frequent night time awakening, difficulty falling asleep and non-restful sleep. Patient has found minimal relief with ambien, klonipin and requip.  Has had 3 prior sleep studies.  Last sleep study 2004, and none showed sleep apnea.  He denies lying worrying.   Use to work swing shift in the past, had problems with sleep then too.  Lives alone.  Denies hallucinations or delusions.  Uses humidifier for white noise, denies watching tv or reading in the bed.  Associated symptoms include: daytime somnolence, fatigue and snoring. Patient denies anxiety, depression and frequent nighttime urination. Symptoms have gradually worsened.  No other aggravating or relieving factors.    No other c/o.  The following portions of the patient's history were reviewed and updated as appropriate: allergies, current medications, past family history, past medical history, past social history, past surgical history and problem list.  ROS Otherwise as in subjective above   Objective: Physical Exam  Filed Vitals:   12/18/12 0942  BP: 130/90  Pulse: 82  Temp: 98.1 F (36.7 C)  Resp: 16    General appearance: alert, no distress, WD/WN Oral cavity: MMM, no major tonsillar or other obstruction Neck: supple, no lymphadenopathy, no thyromegaly, no masses Psychiatric: normal affect, behavior normal, pleasant    Assessment: Encounter Diagnosis  Name Primary?  . Insomnia Yes     Plan: Discussed sleep hygiene measures including  regular sleep schedule, optimal sleep environment, and relaxing presleep rituals.  Discussed stimulus control, avoiding daytime naps, avoiding caffeine after noon, avoiding excess alcohol, avoiding food or drink <2 hours before bedtime.  He did well on Ambien with recent hospitalization.   He is currently taking Requip, Clonazepam 2mg .  We will add Ambien temporarily, but I recommended he discuss his sleep issues with neurology when he sees them this week in f/u.

## 2012-12-18 NOTE — Patient Instructions (Signed)
Insomnia Insomnia is frequent trouble falling and/or staying asleep. Insomnia can be a long term problem or a short term problem. Both are common. Insomnia can be a short term problem when the wakefulness is related to a certain stress or worry. Long term insomnia is often related to ongoing stress during waking hours and/or poor sleeping habits. Overtime, sleep deprivation itself can make the problem worse. Every little thing feels more severe because you are overtired and your ability to cope is decreased.  CAUSES   Stress, anxiety, and depression.  Poor sleeping habits.  Distractions such as TV in the bedroom.  Naps close to bedtime.  Engaging in emotionally charged conversations before bed.  Technical reading before sleep.  Alcohol and other sedatives. They may make the problem worse. They can hurt normal sleep patterns and normal dream activity.  Stimulants such as caffeine for several hours prior to bedtime.  Pain syndromes and shortness of breath can cause insomnia.  Exercise late at night.  Changing time zones may cause sleeping problems (jet lag).  It is sometimes helpful to have someone observe your sleeping patterns. They should look for periods of not breathing during the night (sleep apnea). They should also look to see how long those periods last. If you live alone or observers are uncertain, you can also be observed at a sleep clinic where your sleep patterns will be professionally monitored. Sleep apnea requires a checkup and treatment. Give your caregivers your medical history. Give your caregivers observations your family has made about your sleep.   SYMPTOMS   Not feeling rested in the morning.  Anxiety and restlessness at bedtime.  Difficulty falling and staying asleep.  TREATMENT   Your caregiver may prescribe treatment for an underlying medical disorders. Your caregiver can give advice or help if you are using alcohol or other drugs for self-medication.  Treatment of underlying problems will usually eliminate insomnia problems.  Medications can be prescribed for short time use. They are generally not recommended for lengthy use.  Over-the-counter sleep medicines are not recommended for lengthy use. They can be habit forming.  You can promote easier sleeping by making lifestyle changes such as the following:  Sleep hygiene  Sleep only as much as you need to feel rested and then get out of bed  Keep a regular sleep schedule.  Aim to go to bed at the same time every night, and set an alarm clock to wake up at a fixed time each morning including weekends  Develop a bedtime ritual. Keep a familiar routine of bathing, brushing your teeth, climbing into bed at the same time each night, listening to soothing music. Routines increase the success of falling to sleep faster.  Use relaxation techniques. This can be using breathing and muscle tension release routines. It can also include visualizing peaceful scenes. You can also help control troubling or intruding thoughts by keeping your mind occupied with boring or repetitive thoughts like the old concept of counting sheep. You can make it more creative like imagining planting one beautiful flower after another in your backyard garden.  During your day, work to eliminate stress. When this is not possible use some of the previous suggestions to help reduce the anxiety that accompanies stressful situations.  Avoid forcing sleep  Exercise regularly at least 20 minutes, preferably 4-5 hours before bedtime  Avoid caffeinated beverages after lunch  Avoid alcohol near bedtime; no "night cap"  Avoid smoking, especially in the evening  Do not go to bed   hungry; work on changing your diet and the time of your last meal. No night time snacks.  Adjust bedroom environment to a cool, quiet, dark room  Deal with you worries before bedtime.  Consider counseling for excessive stress, worry, and life situations.   I can provide resources and contact information for counselors if needed.  Stimulus control  Go to bed only when sleepy  Do not watch television, read, eat, or worry while in bed.  Use bed only for sleep and sex  Stop tedious detailed work at least one hour before bedtime.  Get out of the bed if unable to fall asleep within 20 minutes and go to another room.  Return to bed only when sleepy.  Read or do some quiet activity. Keep the lights down. Wait until you feel sleepy and go back to bed.Repeat this step as many times as necessary throughout the night  Do not take a nap during the day   SLEEP DIARY   Keep a diary. Inform your caregiver about your progress. This includes any medication side effects. See your caregiver regularly. Take note of:  Times when you are asleep.  Times when you are awake during the night.  The quality of your sleep.  How you feel the next day.  This information will help your caregiver care for you.  Bring your sleep diary in at the next visit   

## 2012-12-19 ENCOUNTER — Ambulatory Visit: Payer: 59 | Admitting: Physical Therapy

## 2012-12-19 ENCOUNTER — Encounter: Payer: 59 | Admitting: Occupational Therapy

## 2012-12-19 ENCOUNTER — Telehealth: Payer: Self-pay | Admitting: Family Medicine

## 2012-12-19 NOTE — Telephone Encounter (Signed)
Per Crosby Oyster, PA-C request I fax over his last OV note to Dr. Anne Hahn. CLS

## 2012-12-19 NOTE — Telephone Encounter (Signed)
Message copied by Janeice Robinson on Wed Dec 19, 2012 12:25 PM ------      Message from: Jac Canavan      Created: Tue Dec 18, 2012 10:26 AM       Send copy of last sleep study as well as today's OV notes to his neurologist for his appt Thursday ------

## 2012-12-20 ENCOUNTER — Ambulatory Visit (INDEPENDENT_AMBULATORY_CARE_PROVIDER_SITE_OTHER): Payer: 59 | Admitting: Neurology

## 2012-12-20 ENCOUNTER — Encounter: Payer: Self-pay | Admitting: Neurology

## 2012-12-20 VITALS — BP 153/92 | HR 70 | Ht 73.0 in | Wt 203.0 lb

## 2012-12-20 DIAGNOSIS — I639 Cerebral infarction, unspecified: Secondary | ICD-10-CM

## 2012-12-20 DIAGNOSIS — I6350 Cerebral infarction due to unspecified occlusion or stenosis of unspecified cerebral artery: Secondary | ICD-10-CM

## 2012-12-20 DIAGNOSIS — R269 Unspecified abnormalities of gait and mobility: Secondary | ICD-10-CM

## 2012-12-20 DIAGNOSIS — R2681 Unsteadiness on feet: Secondary | ICD-10-CM

## 2012-12-20 DIAGNOSIS — G4700 Insomnia, unspecified: Secondary | ICD-10-CM | POA: Insufficient documentation

## 2012-12-20 MED ORDER — MIRTAZAPINE 30 MG PO TABS: 30.0000 mg | ORAL_TABLET | Freq: Every day | ORAL | Status: DC

## 2012-12-20 NOTE — Progress Notes (Signed)
Reason for visit: Stroke  Micheal Gonzalez is an 52 y.o. male  History of present illness:  Micheal Gonzalez is a 52 year old right-handed black male with a history of cerebrovascular disease. The patient sustained a stroke around 10/09/2012 with right-sided weakness. The patient progressed while in the hospital, and his right arm and leg became flaccid. The patient required inpatient rehabilitation, and he is now living in the home environment with minimal assistance. He lives alone. The patient is getting home health physical therapy and occupational therapy. 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 His job requires minimal physical activity. The patient walks with a cane, and he denies any recent falls. The patient does have some fatigue when walking uphill. The patient denies any residual visual complaints, or memory issues. The patient denies problems controlling the bowels or the bladder. The patient is having chronic problems with insomnia. The patient has restless leg syndrome, but he denies that this is the issue involved with his insomnia. In the past, he has been on trazodone minimal benefit. The patient takes Ambien with plus minus benefit.  Past Medical History  Diagnosis Date  . Allergy   . ED (erectile dysfunction)   . RLS (restless legs syndrome)   . Folliculitis barbae   . Insomnia   . Gait disorder   . Hypertension   . History of stroke     Right hemiparesis    Past Surgical History  Procedure Laterality Date  . Colonoscopy  03/25/11    Family History  Problem Relation Age of Onset  . Cancer Mother 71    Breast cancer  . Heart disease Mother 64    CABG  . Cancer Sister 37    Colon Ca  . Stroke Sister 45  . Cancer Brother     LUNG    Social history:  reports that he has never smoked. He has never used smokeless tobacco. He reports that he drinks about 1.0 ounces of alcohol per week. He reports that he does not use illicit  drugs.   No Known Allergies  Medications:  Current Outpatient Prescriptions on File Prior to Visit  Medication Sig Dispense Refill  . aspirin EC 325 MG EC tablet Take 1 tablet (325 mg total) by mouth daily.  30 tablet  0  . clindamycin-benzoyl peroxide (BENZACLIN) gel       . clonazePAM (KLONOPIN) 2 MG tablet Take 2 mg by mouth at bedtime.      Marland Kitchen losartan (COZAAR) 25 MG tablet Take 1 tablet (25 mg total) by mouth daily.  30 tablet  11  . rOPINIRole (REQUIP) 1 MG tablet Take 1 mg by mouth at bedtime.        No current facility-administered medications on file prior to visit.    ROS:  Out of a complete 14 system review of symptoms, the patient complains only of the following symptoms, and all other reviewed systems are negative.  Chronic insomnia Restless leg syndrome Gait disorder History of stroke  Blood pressure 153/92, pulse 70, height 6\' 1"  (1.854 m), weight 203 lb (92.08 kg).  Physical Exam  General: The patient is alert and cooperative at the time of the examination.  Skin: No significant peripheral edema is noted.   Neurologic Exam  Cranial nerves: Facial symmetry is present. Speech is normal, no aphasia or dysarthria is noted. Extraocular movements are full. Visual fields are full.  Motor: The patient has good strength in the left extremities.  On the right side, the right arm is in flexion, the patient has minimal grip with the right hand, increased tone with the distal and proximal arm, 3/5 strength with extension and flexion at the elbow, and inability to abduct the right arm more than 45. With the right leg, the patient has 4/5 strength proximally, the patient wears an AFO brace on the right foot.  Coordination: The patient has good finger-nose-finger and heel-to-shin on the left, difficulty performing these tasks on the right.  Gait and station: The patient has a circumduction gait with the right leg, the right arm is in flexion. Tandem gait was not attempted.  Romberg is negative. No drift is seen.  Reflexes: Deep tendon reflexes are notable for increased reflexes on the right biceps and triceps, right knee as compared to the left.   Assessment/Plan:  1. Left posterior temporal stroke, right hemiparesis  2. Hypertension  3. Restless leg syndrome  4. Chronic insomnia  The patient will be placed on mirtazapine for sleep. The patient will continue his aspirin therapy, and his outpatient therapy. The patient wishes to go back to work, and I have indicated that he may return by 01/09/2013 part-time. The patient will followup in about 4 months.  Micheal Palau MD 12/20/2012 8:41 PM  [facility] [address] [place], [place] [address]  Phone [phone] Fax [phone]

## 2012-12-20 NOTE — Patient Instructions (Signed)
Take the mirtazepine at night for sleep.

## 2012-12-25 ENCOUNTER — Encounter: Payer: 59 | Admitting: Occupational Therapy

## 2012-12-25 ENCOUNTER — Ambulatory Visit: Payer: 59 | Admitting: Physical Therapy

## 2012-12-27 ENCOUNTER — Encounter: Payer: 59 | Admitting: Occupational Therapy

## 2012-12-27 ENCOUNTER — Ambulatory Visit: Payer: 59 | Admitting: Physical Therapy

## 2013-01-03 ENCOUNTER — Other Ambulatory Visit: Payer: Self-pay | Admitting: Family Medicine

## 2013-01-03 ENCOUNTER — Encounter: Payer: Self-pay | Admitting: Family Medicine

## 2013-01-03 NOTE — Telephone Encounter (Signed)
IS THIS OK 

## 2013-01-03 NOTE — Telephone Encounter (Signed)
Okay to renew

## 2013-01-14 ENCOUNTER — Encounter: Payer: Self-pay | Admitting: Family Medicine

## 2013-01-14 ENCOUNTER — Ambulatory Visit (INDEPENDENT_AMBULATORY_CARE_PROVIDER_SITE_OTHER): Payer: 59 | Admitting: Family Medicine

## 2013-01-14 ENCOUNTER — Telehealth: Payer: Self-pay | Admitting: Internal Medicine

## 2013-01-14 VITALS — BP 140/90 | HR 74 | Wt 204.0 lb

## 2013-01-14 DIAGNOSIS — I10 Essential (primary) hypertension: Secondary | ICD-10-CM

## 2013-01-14 DIAGNOSIS — I639 Cerebral infarction, unspecified: Secondary | ICD-10-CM

## 2013-01-14 DIAGNOSIS — Z23 Encounter for immunization: Secondary | ICD-10-CM

## 2013-01-14 DIAGNOSIS — G4700 Insomnia, unspecified: Secondary | ICD-10-CM

## 2013-01-14 DIAGNOSIS — I6350 Cerebral infarction due to unspecified occlusion or stenosis of unspecified cerebral artery: Secondary | ICD-10-CM

## 2013-01-14 NOTE — Telephone Encounter (Signed)
Pt called stating he wants to go back to interim rehabilitation

## 2013-01-14 NOTE — Progress Notes (Signed)
  Subjective:    Patient ID: Micheal Gonzalez, male    DOB: 14-Dec-1960, 52 y.o.   MRN: [id number]  HPI He is here for recheck. He started back part-time at work on September 17. He has been to physical therapy and apparently they feel his motor capacity is adequate enough for him to drive. His medications were reviewed. He has no concerns or complaints.  Review of Systems     Objective:   Physical Exam Alert and in no distress. [place]. Normal. Definite residual weakness in his right arm and to a lesser extent in his foot. He still is using his cane and walks with a limp.       Assessment & Plan:  Need for prophylactic vaccination and inoculation against influenza - Plan: Flu Vaccine QUAD 36+ mos IM, Ambulatory referral to Physical Therapy  CVA (cerebral infarction)  HTN (hypertension)  Insomnia, persistent  his medications were reviewed. I recommended that he stop the Ambien since he is already on 2 medicines that can sedate him. He will continue on his Klonopin and Requip which he has been off for several years for sleep issues and RLS. He is to check with physical therapy and have them send me a note concerning his functional ability to potentially die. Discussed possibly having him take a driving test. Flu shot given with risks and benefits discussed.

## 2013-01-14 NOTE — Patient Instructions (Signed)
Have physical therapy evaluate you for your ability to drive and have them send me a note

## 2013-01-14 NOTE — Telephone Encounter (Signed)
SENT ORDER

## 2013-01-17 ENCOUNTER — Telehealth: Payer: Self-pay | Admitting: Family Medicine

## 2013-01-18 ENCOUNTER — Telehealth: Payer: Self-pay | Admitting: Family Medicine

## 2013-01-18 NOTE — Telephone Encounter (Signed)
done

## 2013-01-18 NOTE — Telephone Encounter (Signed)
Pt will come by and pick note up

## 2013-01-18 NOTE — Telephone Encounter (Signed)
I am okay with giving him a note to go back to work full-time.

## 2013-02-03 ENCOUNTER — Other Ambulatory Visit: Payer: Self-pay | Admitting: Family Medicine

## 2013-02-12 ENCOUNTER — Ambulatory Visit (INDEPENDENT_AMBULATORY_CARE_PROVIDER_SITE_OTHER): Payer: 59 | Admitting: Medical

## 2013-02-12 ENCOUNTER — Encounter: Payer: Self-pay | Admitting: Medical

## 2013-02-12 VITALS — BP 160/88 | HR 72 | Temp 97.6°F | Resp 16 | Wt 202.0 lb

## 2013-02-12 DIAGNOSIS — I10 Essential (primary) hypertension: Secondary | ICD-10-CM

## 2013-02-12 DIAGNOSIS — Z8673 Personal history of transient ischemic attack (TIA), and cerebral infarction without residual deficits: Secondary | ICD-10-CM

## 2013-02-12 MED ORDER — HYDROCHLOROTHIAZIDE 25 MG PO TABS: 25.0000 mg | ORAL_TABLET | Freq: Every day | ORAL | Status: DC

## 2013-02-12 NOTE — Progress Notes (Signed)
  Subjective:  Here for BP concerns.  He is s/p stroke with right sided paralysis in June, HTN.  He is here today because he didn't feel all that well today, and BP was 215/102 at one point today on his home BP cuff.  He got anxious and wanted to come in.   Currently he denies chest pain, palpitations, edema, shortness of breath, new numbness tingling or weakness. He is compliant losartan 25 mg daily.  Denies any recent change in activity, hasn't been eating more salt or fast food. He is getting home physical therapy. Has primary limitation currently is his gait and right arm.  Past Medical History  Diagnosis Date  . Allergy   . ED (erectile dysfunction)   . RLS (restless legs syndrome)   . Folliculitis barbae   . Insomnia   . Gait disorder   . Hypertension   . History of stroke     Right hemiparesis   ROS as in subjective  Objective: Filed Vitals:   02/12/13 1628  BP: 160/88  Pulse: 72  Temp: 97.6 F (36.4 C)  Resp: 16    General appearance: alert, no distress, WD/WN, pleasant  Neck: supple, no lymphadenopathy, no thyromegaly, no masses Heart: RRR, normal S1, S2, no murmurs Lungs: CTA bilaterally, no wheezes, rhonchi, or rales Pulses: 2+ symmetric, upper and lower extremities, normal cap refill Ext: no edema Psych: pleasant, answered questions appropriately Neuro: right arm and hand with weakness and limited use, gait antalgic, but otherwise right leg with good strength.    Otherwise nonfocal, no new neuro changes.   Assessment: Encounter Diagnoses  Name Primary?  . Essential hypertension, benign Yes  . History of stroke     Plan: BP not at goal.  Compliant with medication though.  Advised he c/t Losartan 25mg , but add HCTZ 25mg  daily in the morning.  Discussed risk and benefits of medication.   Continue physical therapy. Recheck in 3-4 weeks, sooner when necessary.

## 2013-03-11 ENCOUNTER — Telehealth: Payer: Self-pay | Admitting: Family Medicine

## 2013-03-11 NOTE — Telephone Encounter (Signed)
Please call patient wants letter stating that he is ok to work with a Systems analyst and if there are any limitations

## 2013-03-11 NOTE — Telephone Encounter (Signed)
Have Vernona Rieger type of a statement. I have no problems with him working with a trainer and let him know that.

## 2013-03-11 NOTE — Telephone Encounter (Signed)
I CALLED PT HE IS AWARE OF OKAY FOR LETTER  AND DR.LALONDE WANTED YOU TO WRITE IT PT SAID TO CALL AND LET HIM KNOW WHEN IT IS READY TO PICK UP

## 2013-03-12 ENCOUNTER — Encounter: Payer: Self-pay | Admitting: Family Medicine

## 2013-03-12 NOTE — Telephone Encounter (Signed)
Left message for pt that letter is ready for pickup.

## 2013-04-06 ENCOUNTER — Other Ambulatory Visit: Payer: Self-pay | Admitting: Family Medicine

## 2013-04-08 NOTE — Telephone Encounter (Signed)
DR.LALONDE IS THIS OKAY 

## 2013-04-21 ENCOUNTER — Encounter: Payer: Self-pay | Admitting: Family Medicine

## 2013-04-22 NOTE — Telephone Encounter (Signed)
I HAVE CALLED [facility] AT [phone] PATIENT STILL HAVE ORDERS FOR P/T UNTIL 05/17/13 CALLED PT TO FIND OUT WHAT WAS GOING ON HE SAID HIS THERAPIST TOLD HIM TODAY WAS HIS LAST DAY HE WAS GOING TO CALL AND FIND OUT AND CALL ME BACK

## 2013-05-18 ENCOUNTER — Other Ambulatory Visit: Payer: Self-pay | Admitting: Medical

## 2013-05-20 ENCOUNTER — Other Ambulatory Visit: Payer: Self-pay | Admitting: Medical

## 2013-05-20 ENCOUNTER — Other Ambulatory Visit: Payer: Self-pay | Admitting: Physical Medicine and Rehabilitation

## 2013-05-21 ENCOUNTER — Telehealth: Payer: Self-pay | Admitting: Family Medicine

## 2013-05-21 MED ORDER — CLINDAMYCIN PHOS-BENZOYL PEROX 1.2-5 % EX GEL: 1.0000 "application " | Freq: Two times a day (BID) | CUTANEOUS | Status: DC

## 2013-05-21 NOTE — Telephone Encounter (Signed)
Patient was switched due to [place] due to insurance issues.

## 2013-05-22 ENCOUNTER — Telehealth: Payer: Self-pay | Admitting: Family Medicine

## 2013-05-22 ENCOUNTER — Other Ambulatory Visit: Payer: Self-pay | Admitting: Family Medicine

## 2013-05-22 ENCOUNTER — Encounter: Payer: Self-pay | Admitting: Family Medicine

## 2013-05-23 ENCOUNTER — Telehealth: Payer: Self-pay | Admitting: *Deleted

## 2013-05-23 NOTE — Telephone Encounter (Signed)
Faxed order

## 2013-05-23 NOTE — Telephone Encounter (Signed)
[place]

## 2013-05-23 NOTE — Telephone Encounter (Signed)
Spoke with PT states that from their understanding the goal for PT was the end of this week, which will be tomorrow. States if you want to continue PT they will need additional orders

## 2013-05-23 NOTE — Telephone Encounter (Signed)
Go ahead and send the orders in

## 2013-05-28 ENCOUNTER — Encounter: Payer: Self-pay | Admitting: Family Medicine

## 2013-05-28 ENCOUNTER — Ambulatory Visit (INDEPENDENT_AMBULATORY_CARE_PROVIDER_SITE_OTHER): Payer: 59 | Admitting: Family Medicine

## 2013-05-28 VITALS — BP 160/104 | HR 59 | Wt 210.0 lb

## 2013-05-28 DIAGNOSIS — I639 Cerebral infarction, unspecified: Secondary | ICD-10-CM

## 2013-05-28 DIAGNOSIS — Z566 Other physical and mental strain related to work: Secondary | ICD-10-CM

## 2013-05-28 DIAGNOSIS — I10 Essential (primary) hypertension: Secondary | ICD-10-CM

## 2013-05-28 DIAGNOSIS — I6350 Cerebral infarction due to unspecified occlusion or stenosis of unspecified cerebral artery: Secondary | ICD-10-CM

## 2013-05-28 DIAGNOSIS — Z569 Unspecified problems related to employment: Secondary | ICD-10-CM

## 2013-05-28 MED ORDER — LOSARTAN POTASSIUM-HCTZ 50-12.5 MG PO TABS: 1.0000 | ORAL_TABLET | Freq: Every day | ORAL | Status: DC

## 2013-05-28 NOTE — Telephone Encounter (Signed)
Spoke with pt, I faxed orders to continue PT on Jan.29th, will refax orders today

## 2013-05-28 NOTE — Progress Notes (Signed)
   Subjective:    Patient ID: Micheal Gonzalez, male    DOB: [birth year], 53 y.o.   MRN: [id number]  HPI He is here mainly for discussion of his hypertension. He has been having his blood pressure checked at home by the physical therapist and the numbers are running just under the 140/90 range. He is presently on medications for this but states he has not been taking the HCTZ. He continues to make progress with his underlying CVA. The therapist says that he continues to improve and he would like to continue to do this. He also states that he's been under a lot of work-related stress. He now has a IT consultantnew supervisor and apparently she is difficult to deal with in that he cannot seem to satisfy her. He does have a scheduled meeting with her to discuss this. He is looking to transfer to a different position. 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 Blood pressure is recorded.       Assessment & Plan:  HTN (hypertension) - Plan: losartan-hydrochlorothiazide (HYZAAR) 50-12.5 MG per tablet  Work-related stress  CVA (cerebral infarction)  I will switch him to Hyzaar from previous lower dosing of losartan. Explained that I will be given the fluid pill as part of the new medication. He will continue in physical therapy. Discuss work-related stress with him and various options. He seems to have a good handle on this. Approximately 30 minutes spent discussing this with him. Recheck here one month.

## 2013-05-28 NOTE — Telephone Encounter (Signed)
Did pt discuss this with you?

## 2013-06-25 ENCOUNTER — Ambulatory Visit (INDEPENDENT_AMBULATORY_CARE_PROVIDER_SITE_OTHER): Payer: 59 | Admitting: Family Medicine

## 2013-06-25 VITALS — BP 130/98 | HR 66 | Wt 210.0 lb

## 2013-06-25 DIAGNOSIS — M129 Arthropathy, unspecified: Secondary | ICD-10-CM

## 2013-06-25 DIAGNOSIS — M1990 Unspecified osteoarthritis, unspecified site: Secondary | ICD-10-CM

## 2013-06-25 DIAGNOSIS — I10 Essential (primary) hypertension: Secondary | ICD-10-CM

## 2013-06-25 NOTE — Progress Notes (Signed)
   Subjective:    Patient ID: Micheal Gonzalez, male    DOB: 1960/06/04, 53 y.o.   MRN: [id number]  HPI He is here for recheck on his blood pressure. He continues on medications in the chart and is having no difficulty with them. He did bring in blood pressure readings from his rehabilitation and they were reviewed and both of them are in the normal range. He also complains of left third finger pain with motion especially in the middle part of his finger.   Review of Systems     Objective:   Physical Exam Alert and in no distress. Blood pressure is recorded. Exam of his left third finger shows full motion. No swelling or tenderness.       Assessment & Plan:  HTN (hypertension)  Arthritis  continue present medication regimen. Discussed the arthritis with him and recommended supportive care.

## 2013-07-10 ENCOUNTER — Encounter: Payer: Self-pay | Admitting: Neurology

## 2013-07-10 ENCOUNTER — Ambulatory Visit (INDEPENDENT_AMBULATORY_CARE_PROVIDER_SITE_OTHER): Payer: 59 | Admitting: Neurology

## 2013-07-10 VITALS — BP 135/92 | HR 63 | Ht 72.75 in | Wt 209.0 lb

## 2013-07-10 DIAGNOSIS — R5381 Other malaise: Secondary | ICD-10-CM

## 2013-07-10 DIAGNOSIS — I69398 Other sequelae of cerebral infarction: Principal | ICD-10-CM

## 2013-07-10 DIAGNOSIS — R5383 Other fatigue: Secondary | ICD-10-CM

## 2013-07-10 DIAGNOSIS — I69959 Hemiplegia and hemiparesis following unspecified cerebrovascular disease affecting unspecified side: Secondary | ICD-10-CM

## 2013-07-10 DIAGNOSIS — G2581 Restless legs syndrome: Secondary | ICD-10-CM

## 2013-07-10 DIAGNOSIS — R209 Unspecified disturbances of skin sensation: Secondary | ICD-10-CM

## 2013-07-10 DIAGNOSIS — I69359 Hemiplegia and hemiparesis following cerebral infarction affecting unspecified side: Secondary | ICD-10-CM | POA: Insufficient documentation

## 2013-07-10 DIAGNOSIS — I69998 Other sequelae following unspecified cerebrovascular disease: Secondary | ICD-10-CM

## 2013-07-10 NOTE — Progress Notes (Signed)
Reason for visit: History of stroke  Micheal Gonzalez Gonzalez is an 53 y.o. male  History of present illness:  Micheal Gonzalez Gonzalez is a 53 year old right-handed black male with a history of a left posterior temporal stroke with a prominent right hemiparesis. The patient is back to work, but he has had ongoing physical and occupational therapy. The patient indicates that within the last week, he has had a sudden unexplained change in his energy level, with severe fatigue that has gotten to the point where he is no longer able to work a full day. The patient has not had any falls. The patient denies any new numbness or weakness of the face, arms, or legs. The patient indicates that he is sleeping relatively well. The patient takes medication for restless leg syndrome. The patient remains on aspirin therapy. His blood pressures have been ranging in the 130 to the 150 range, and diastolic blood pressures are in the 85-95 range. The patient denies any other medical issues that have come up since last seen.  Past Medical History  Diagnosis Date  . Allergy   . ED (erectile dysfunction)   . RLS (restless legs syndrome)   . Folliculitis barbae   . Insomnia   . Gait disorder   . Hypertension   . History of stroke     Right hemiparesis    Past Surgical History  Procedure Laterality Date  . Colonoscopy  03/25/11    Family History  Problem Relation Age of Onset  . Cancer Mother 1973    Breast cancer  . Heart disease Mother 2268    CABG  . Cancer Sister 8549    Colon Ca  . Stroke Sister 4761  . Cancer Brother     LUNG    Social history:  reports that he has never smoked. He has never used smokeless tobacco. He reports that he drinks about 1.0 ounces of alcohol per week. He reports that he does not use illicit drugs.   No Known Allergies  Medications:  Current Outpatient Prescriptions on File Prior to Visit  Medication Sig Dispense Refill  . aspirin EC 325 MG EC tablet Take 1 tablet (325 mg total) by  mouth daily.  30 tablet  0  . Clindamycin-Benzoyl Per, Refr, gel Apply 1 application topically 2 (two) times daily.  45 g  11  . clonazePAM (KLONOPIN) 2 MG tablet TAKE 1 TABLET DAILY  30 tablet  5  . losartan-hydrochlorothiazide (HYZAAR) 50-12.5 MG per tablet Take 1 tablet by mouth daily.  90 tablet  3  . rOPINIRole (REQUIP) 1 MG tablet TAKE 1 TABLET BY MOUTH EVERY DAY  30 tablet  5   No current facility-administered medications on file prior to visit.    ROS:  Out of a complete 14 system review of symptoms, the patient complains only of the following symptoms, and all other reviewed systems are negative.  Fatigue Restless legs, insomnia Weakness  Blood pressure 135/92, pulse 63, height 6' 0.75" (1.848 m), weight 209 lb (94.802 kg).  Physical Exam  General: The patient is alert and cooperative at the time of the examination.  Skin: No significant peripheral edema is noted.   Neurologic Exam  Mental status: The patient is oriented x 3.  Cranial nerves: Facial symmetry is present. Speech is normal, no aphasia or dysarthria is noted. Extraocular movements are full. Visual fields are full.  Motor: The patient has good strength in the left extremities. On the right side, the patient hold  the right arm in flexion, with 4/5 strength throughout the arm. Increased motor tone is noted on the right arm. The patient has an AFO brace on the right ankle. Near normal strength is seen with the right leg, again increased motor tone is noted.  Sensory examination: Soft touch sensation is symmetric on the face, arms, and legs.  Coordination: The patient has good finger-nose-finger and heel-to-shin on the left, the patient has difficulty performing these tasks on the right.  Gait and station: The patient has a circumduction gait with the right leg. Tandem gait was not attempted. Romberg is negative. No drift is seen.  Reflexes: Deep tendon reflexes are notable for increased reflexes on the right  arm compared to the left, more symmetric on the legs.   Assessment/Plan:  1. History of left brain stroke, right hemiparesis  2. Gait disorder  3. Restless leg syndrome  4. Hypertension   The patient doing relatively well with his blood pressures, but lowering the blood pressures another 7-10 points on the systolic and diastolic blood pressures may improve his secondary risk reduction for stroke. The patient is getting ongoing therapy, and in general is doing well. The patient will followup through this office if needed. Given the recent onset of severe fatigue, blood work will be done today.  Marlan Palau MD 07/10/2013 11:37 AM  [facility] [address] [place], [place] [address]  Phone [phone] Fax [phone]

## 2013-07-10 NOTE — Patient Instructions (Signed)

## 2013-07-11 ENCOUNTER — Encounter: Payer: Self-pay | Admitting: Neurology

## 2013-07-11 ENCOUNTER — Telehealth: Payer: Self-pay | Admitting: Neurology

## 2013-07-11 DIAGNOSIS — D518 Other vitamin B12 deficiency anemias: Secondary | ICD-10-CM

## 2013-07-11 HISTORY — DX: Other vitamin B12 deficiency anemias: D51.8

## 2013-07-11 LAB — COMPREHENSIVE METABOLIC PANEL
A/G RATIO: 1.8 (ref 1.1–2.5)
ALT: 13 IU/L (ref 0–44)
AST: 17 IU/L (ref 0–40)
Albumin: 4.4 g/dL (ref 3.5–5.5)
Alkaline Phosphatase: 60 IU/L (ref 39–117)
BILIRUBIN TOTAL: 0.6 mg/dL (ref 0.0–1.2)
BUN/Creatinine Ratio: 7 — ABNORMAL LOW (ref 9–20)
BUN: 10 mg/dL (ref 6–24)
CALCIUM: 9.5 mg/dL (ref 8.7–10.2)
CO2: 24 mmol/L (ref 18–29)
CREATININE: 1.43 mg/dL — AB (ref 0.76–1.27)
Chloride: 99 mmol/L (ref 97–108)
GFR, EST AFRICAN AMERICAN: 64 mL/min/{1.73_m2} (ref >59)
GFR, EST NON AFRICAN AMERICAN: 56 mL/min/{1.73_m2} — AB (ref >59)
GLOBULIN, TOTAL: 2.4 g/dL (ref 1.5–4.5)
GLUCOSE: 89 mg/dL (ref 65–99)
POTASSIUM: 3.9 mmol/L (ref 3.5–5.2)
SODIUM: 140 mmol/L (ref 134–144)
Total Protein: 6.8 g/dL (ref 6.0–8.5)

## 2013-07-11 LAB — CBC WITH DIFFERENTIAL
BASOS ABS: 0 10*3/uL (ref 0.0–0.2)
BASOS: 0 %
EOS ABS: 0.1 10*3/uL (ref 0.0–0.4)
Eos: 2 %
HEMATOCRIT: 42.8 % (ref 37.5–51.0)
HEMOGLOBIN: 15.4 g/dL (ref 12.6–17.7)
Immature Grans (Abs): 0 10*3/uL (ref 0.0–0.1)
Immature Granulocytes: 0 %
Lymphocytes Absolute: 1.9 10*3/uL (ref 0.7–3.1)
Lymphs: 45 %
MCH: 32.7 pg (ref 26.6–33.0)
MCHC: 36 g/dL — AB (ref 31.5–35.7)
MCV: 91 fL (ref 79–97)
MONOS ABS: 0.4 10*3/uL (ref 0.1–0.9)
Monocytes: 10 %
NEUTROS ABS: 1.8 10*3/uL (ref 1.4–7.0)
NEUTROS PCT: 43 %
Platelets: 192 10*3/uL (ref 150–379)
RBC: 4.71 x10E6/uL (ref 4.14–5.80)
RDW: 13.1 % (ref 12.3–15.4)
WBC: 4.3 10*3/uL (ref 3.4–10.8)

## 2013-07-11 LAB — VITAMIN B12: Vitamin B-12: 144 pg/mL — ABNORMAL LOW (ref 211–946)

## 2013-07-11 LAB — TSH: TSH: 3.18 u[IU]/mL (ref 0.450–4.500)

## 2013-07-11 NOTE — Telephone Encounter (Signed)
I called patient. The blood work shows a decline in renal function over the last 8 or 9 months. The patient also appears to have a low vitamin B12 level, and this may be the etiology of his recent problems with fatigue. I'll have him come in for a B12 injection, and he will go on oral supplementation, taking 1000 mcg of vitamin B12 daily. The vitamin B12 level we'll need to be checked within the next 3 or 4 months to make sure that he is able to maintain the level.

## 2013-07-17 ENCOUNTER — Ambulatory Visit (INDEPENDENT_AMBULATORY_CARE_PROVIDER_SITE_OTHER): Payer: 59 | Admitting: *Deleted

## 2013-07-17 DIAGNOSIS — E538 Deficiency of other specified B group vitamins: Secondary | ICD-10-CM

## 2013-07-17 MED ORDER — CYANOCOBALAMIN 1000 MCG/ML IJ SOLN
1000.0000 ug | Freq: Once | INTRAMUSCULAR | Status: AC
  Administered 2013-07-17: 1000 ug via INTRAMUSCULAR

## 2013-07-17 NOTE — Progress Notes (Signed)
Pt here for B 12 injection.  Under aseptic technique cyanocobalamin 107800mcg/1ml IM given R deltoid.  Tolerated well.  Bandaid applied.   Pt to continue with B 12 oral supplementation 1000mcg po daily.  Pt verbalized understanding.

## 2013-07-17 NOTE — Patient Instructions (Signed)
To check out.   NAD.

## 2013-07-18 ENCOUNTER — Other Ambulatory Visit: Payer: Self-pay | Admitting: Family Medicine

## 2013-08-07 ENCOUNTER — Other Ambulatory Visit: Payer: Self-pay | Admitting: Family Medicine

## 2013-08-07 NOTE — Telephone Encounter (Signed)
Is this okay to refill? 

## 2013-08-07 NOTE — Telephone Encounter (Signed)
Go ahead and call this in 

## 2013-08-08 NOTE — Telephone Encounter (Signed)
Medication done.

## 2013-08-15 ENCOUNTER — Encounter: Payer: Self-pay | Admitting: Family Medicine

## 2013-09-17 ENCOUNTER — Other Ambulatory Visit: Payer: Self-pay | Admitting: Family Medicine

## 2014-01-06 ENCOUNTER — Other Ambulatory Visit: Payer: Self-pay | Admitting: Family Medicine

## 2014-01-06 NOTE — Telephone Encounter (Signed)
IS THIS OKAY 

## 2014-02-05 ENCOUNTER — Other Ambulatory Visit: Payer: Self-pay | Admitting: Family Medicine

## 2014-02-05 NOTE — Telephone Encounter (Signed)
IS THIS OKAY 

## 2014-03-13 ENCOUNTER — Other Ambulatory Visit: Payer: Self-pay | Admitting: Family Medicine

## 2014-03-13 NOTE — Telephone Encounter (Signed)
Is this okay last ov 3/15

## 2014-03-26 ENCOUNTER — Other Ambulatory Visit: Payer: Self-pay

## 2014-03-26 ENCOUNTER — Telehealth: Payer: Self-pay | Admitting: Family Medicine

## 2014-03-26 MED ORDER — ROPINIROLE HCL 1 MG PO TABS: 1.0000 mg | ORAL_TABLET | Freq: Every day | ORAL | Status: DC

## 2014-03-26 NOTE — Telephone Encounter (Signed)
done

## 2014-03-26 NOTE — Telephone Encounter (Signed)
Pt made an appt for next week but is completely out of requip. Please send a refill to a pharmacy in [place]. [address]

## 2014-03-26 NOTE — Telephone Encounter (Signed)
Call in a month supply to that pharmacy

## 2014-03-27 ENCOUNTER — Telehealth: Payer: Self-pay | Admitting: Family Medicine

## 2014-03-27 ENCOUNTER — Other Ambulatory Visit: Payer: Self-pay | Admitting: Family Medicine

## 2014-03-27 MED ORDER — ROPINIROLE HCL 1 MG PO TABS: 1.0000 mg | ORAL_TABLET | Freq: Every day | ORAL | Status: DC

## 2014-03-27 NOTE — Telephone Encounter (Signed)
I called out a 30 day supply of this medication to  [facility] [address] [place] [id number] [phone]  i WAS UNABLE TO FIND THE PHARMACY IN THE SYSTEM.

## 2014-03-27 NOTE — Telephone Encounter (Signed)
I called out his medication to [facility] [address] [place], [place] Patient is aware of this

## 2014-03-31 ENCOUNTER — Encounter: Payer: Self-pay | Admitting: Family Medicine

## 2014-03-31 ENCOUNTER — Ambulatory Visit (INDEPENDENT_AMBULATORY_CARE_PROVIDER_SITE_OTHER): Payer: Self-pay | Admitting: Family Medicine

## 2014-03-31 VITALS — BP 118/82 | HR 67 | Wt 209.0 lb

## 2014-03-31 DIAGNOSIS — G4700 Insomnia, unspecified: Secondary | ICD-10-CM

## 2014-03-31 DIAGNOSIS — I639 Cerebral infarction, unspecified: Secondary | ICD-10-CM

## 2014-03-31 DIAGNOSIS — I10 Essential (primary) hypertension: Secondary | ICD-10-CM

## 2014-03-31 DIAGNOSIS — G2581 Restless legs syndrome: Secondary | ICD-10-CM

## 2014-03-31 MED ORDER — LOSARTAN POTASSIUM-HCTZ 50-12.5 MG PO TABS: 1.0000 | ORAL_TABLET | Freq: Every day | ORAL | Status: DC

## 2014-03-31 MED ORDER — ATENOLOL 25 MG PO TABS: 25.0000 mg | ORAL_TABLET | Freq: Every day | ORAL | Status: DC

## 2014-03-31 MED ORDER — CLONAZEPAM 2 MG PO TABS: 2.0000 mg | ORAL_TABLET | Freq: Every day | ORAL | Status: DC

## 2014-03-31 NOTE — Progress Notes (Signed)
   Subjective:    Patient ID: Micheal LoaJeffrey A Pottenger, male    DOB: 1960/09/04, 53 y.o.   MRN: [id number]  HPI He is here for recheck. [place]. He did have a physician down there but apparently was not happy with the care. He does have a history of CVA and does complain of continued difficulty with right-sided weakness as well as fatigue slight dizziness. He is driving. He continues have difficulty with insomnia and also has a history of RLS. He apparently has had 3 sleep studies done, none of which has shown any difficulty with sleep apnea. In the past he had been on Klonopin as well as equipment and most recently was tried on trazodone. He did not think the trazodone was working as well as Arts development officerKlonopin. He is in the process of looking for a new job.  Review of Systems     Objective:   Physical Exam Alert and in no distress. Weakness is noted in the right arm and leg. He does walk with a cane.       Assessment & Plan:  Essential hypertension - Plan: atenolol (TENORMIN) 25 MG tablet, losartan-hydrochlorothiazide (HYZAAR) 50-12.5 MG per tablet  Cerebral infarction due to unspecified mechanism  Insomnia, persistent - Plan: clonazePAM (KLONOPIN) 2 MG tablet  RLS (restless legs syndrome) - Plan: clonazePAM (KLONOPIN) 2 MG tablet  discussed job situation with him. Encouraged him to continue to look for 1. I will place him back on Klonopin and hold off on the Requip. He will keep me informed as to his progress.

## 2014-04-07 ENCOUNTER — Other Ambulatory Visit: Payer: Self-pay

## 2014-04-08 ENCOUNTER — Telehealth: Payer: Self-pay | Admitting: Internal Medicine

## 2014-04-08 NOTE — Telephone Encounter (Signed)
Faxed over medical records to [facility] @ [phone] on 04/07/14 

## 2014-04-22 ENCOUNTER — Other Ambulatory Visit: Payer: Self-pay

## 2014-04-22 DIAGNOSIS — I10 Essential (primary) hypertension: Secondary | ICD-10-CM

## 2014-04-22 MED ORDER — ATENOLOL 25 MG PO TABS: 25.0000 mg | ORAL_TABLET | Freq: Every day | ORAL | Status: DC

## 2014-04-22 MED ORDER — LOSARTAN POTASSIUM-HCTZ 50-12.5 MG PO TABS: 1.0000 | ORAL_TABLET | Freq: Every day | ORAL | Status: DC

## 2014-04-22 MED ORDER — CLINDAMYCIN PHOS-BENZOYL PEROX 1.2-5 % EX GEL: 1.0000 "application " | Freq: Two times a day (BID) | CUTANEOUS | Status: DC

## 2014-04-22 MED ORDER — ROPINIROLE HCL 1 MG PO TABS: 1.0000 mg | ORAL_TABLET | Freq: Every day | ORAL | Status: DC

## 2014-04-24 ENCOUNTER — Telehealth: Payer: Self-pay | Admitting: Neurology

## 2014-04-24 NOTE — Telephone Encounter (Signed)
Pt is calling stating that he did not mean to cxl his appt for 04/28/14.  He tired to call back to reschedule but the slot had been filled.  He needs another appointment asap.  I could only offer an appt in May and that will not work.  Please call and advise.

## 2014-04-24 NOTE — Telephone Encounter (Signed)
Spoke to patient and he is rescheduled for 0800 on 04-28-13.

## 2014-04-26 ENCOUNTER — Encounter: Payer: Self-pay | Admitting: Family Medicine

## 2014-04-28 ENCOUNTER — Ambulatory Visit: Payer: Self-pay | Admitting: Neurology

## 2014-04-28 ENCOUNTER — Encounter: Payer: Self-pay | Admitting: Neurology

## 2014-04-28 ENCOUNTER — Ambulatory Visit (INDEPENDENT_AMBULATORY_CARE_PROVIDER_SITE_OTHER): Payer: 59 | Admitting: Neurology

## 2014-04-28 VITALS — BP 151/89 | HR 56 | Ht 72.0 in | Wt 210.2 lb

## 2014-04-28 DIAGNOSIS — R5382 Chronic fatigue, unspecified: Secondary | ICD-10-CM

## 2014-04-28 DIAGNOSIS — G4700 Insomnia, unspecified: Secondary | ICD-10-CM

## 2014-04-28 DIAGNOSIS — R2681 Unsteadiness on feet: Secondary | ICD-10-CM

## 2014-04-28 DIAGNOSIS — I69398 Other sequelae of cerebral infarction: Secondary | ICD-10-CM

## 2014-04-28 DIAGNOSIS — D518 Other vitamin B12 deficiency anemias: Secondary | ICD-10-CM

## 2014-04-28 DIAGNOSIS — I69359 Hemiplegia and hemiparesis following cerebral infarction affecting unspecified side: Secondary | ICD-10-CM

## 2014-04-28 MED ORDER — TRAZODONE HCL 100 MG PO TABS: 100.0000 mg | ORAL_TABLET | Freq: Every day | ORAL | Status: DC

## 2014-04-28 NOTE — Patient Instructions (Signed)
Insomnia Insomnia is frequent trouble falling and/or staying asleep. Insomnia can be a long term problem or a short term problem. Both are common. Insomnia can be a short term problem when the wakefulness is related to a certain stress or worry. Long term insomnia is often related to ongoing stress during waking hours and/or poor sleeping habits. Overtime, sleep deprivation itself can make the problem worse. Every little thing feels more severe because you are overtired and your ability to cope is decreased. CAUSES   Stress, anxiety, and depression.  Poor sleeping habits.  Distractions such as TV in the bedroom.  Naps close to bedtime.  Engaging in emotionally charged conversations before bed.  Technical reading before sleep.  Alcohol and other sedatives. They may make the problem worse. They can hurt normal sleep patterns and normal dream activity.  Stimulants such as caffeine for several hours prior to bedtime.  Pain syndromes and shortness of breath can cause insomnia.  Exercise late at night.  Changing time zones may cause sleeping problems (jet lag). It is sometimes helpful to have someone observe your sleeping patterns. They should look for periods of not breathing during the night (sleep apnea). They should also look to see how long those periods last. If you live alone or observers are uncertain, you can also be observed at a sleep clinic where your sleep patterns will be professionally monitored. Sleep apnea requires a checkup and treatment. Give your caregivers your medical history. Give your caregivers observations your family has made about your sleep.  SYMPTOMS   Not feeling rested in the morning.  Anxiety and restlessness at bedtime.  Difficulty falling and staying asleep. TREATMENT   Your caregiver may prescribe treatment for an underlying medical disorders. Your caregiver can give advice or help if you are using alcohol or other drugs for self-medication. Treatment  of underlying problems will usually eliminate insomnia problems.  Medications can be prescribed for short time use. They are generally not recommended for lengthy use.  Over-the-counter sleep medicines are not recommended for lengthy use. They can be habit forming.  You can promote easier sleeping by making lifestyle changes such as:  Using relaxation techniques that help with breathing and reduce muscle tension.  Exercising earlier in the day.  Changing your diet and the time of your last meal. No night time snacks.  Establish a regular time to go to bed.  Counseling can help with stressful problems and worry.  Soothing music and white noise may be helpful if there are background noises you cannot remove.  Stop tedious detailed work at least one hour before bedtime. HOME CARE INSTRUCTIONS   Keep a diary. Inform your caregiver about your progress. This includes any medication side effects. See your caregiver regularly. Take note of:  Times when you are asleep.  Times when you are awake during the night.  The quality of your sleep.  How you feel the next day. This information will help your caregiver care for you.  Get out of bed if you are still awake after 15 minutes. Read or do some quiet activity. Keep the lights down. Wait until you feel sleepy and go back to bed.  Keep regular sleeping and waking hours. Avoid naps.  Exercise regularly.  Avoid distractions at bedtime. Distractions include watching television or engaging in any intense or detailed activity like attempting to balance the household checkbook.  Develop a bedtime ritual. Keep a familiar routine of bathing, brushing your teeth, climbing into bed at the same   time each night, listening to soothing music. Routines increase the success of falling to sleep faster.  Use relaxation techniques. This can be using breathing and muscle tension release routines. It can also include visualizing peaceful scenes. You can  also help control troubling or intruding thoughts by keeping your mind occupied with boring or repetitive thoughts like the old concept of counting sheep. You can make it more creative like imagining planting one beautiful flower after another in your backyard garden.  During your day, work to eliminate stress. When this is not possible use some of the previous suggestions to help reduce the anxiety that accompanies stressful situations. MAKE SURE YOU:   Understand these instructions.  Will watch your condition.  Will get help right away if you are not doing well or get worse. Document Released: 04/08/2000 Document Revised: 07/04/2011 Document Reviewed: 05/09/2007 ExitCare Patient Information 2015 [employer]. This information is not intended to replace advice given to you by your health care provider. Make sure you discuss any questions you have with your health care provider.  

## 2014-04-28 NOTE — Progress Notes (Signed)
Reason for visit: Fatigue  Micheal Gonzalez is an 54 y.o. 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 The patient has minimal use of his right upper extremity, and he has an AFO brace on the right leg with a circumduction gait disorder. He indicates that he has not having problems with falling, he has a circumduction gait with the right leg. He does not use a cane for ambulation. He has had some issues with fatigue, and on his last visit, he was found to have a vitamin B12 deficiency. The indicates that supplementation with vitamin B12 has been helpful. He indicates that his primary care physician has checked a vitamin B12 level recently and it was adequate. He recently was fired from his job secondary due to performance issues. Since this occurrence, he has had a lot of problems with anxiety and depression. He indicates that he is not sleeping well, and he will take lorazepam up to twice a day if needed for anxiety. Anxiety is oftentimes worse at night. He is also on trazodone taking 50 mg at night. He returns this office for an evaluation.  Past Medical History  Diagnosis Date  . Allergy   . ED (erectile dysfunction)   . RLS (restless legs syndrome)   . Folliculitis barbae   . Insomnia   . Gait disorder   . Hypertension   . History of stroke     Right hemiparesis  . Other vitamin B12 deficiency anemia 07/11/2013    Past Surgical History  Procedure Laterality Date  . Colonoscopy  03/25/11    Family History  Problem Relation Age of Onset  . Cancer Mother 44    Breast cancer  . Heart disease Mother 34    CABG  . Cancer Sister 24    Colon Ca  . Stroke Sister 38  . Cancer Brother     LUNG    Social history:  reports that he has never smoked. He has never used smokeless tobacco. He reports that he drinks about 1.0 oz of alcohol per week. He reports that he does not  use illicit drugs.   No Known Allergies  Medications:  Current Outpatient Prescriptions on File Prior to Visit  Medication Sig Dispense Refill  . aspirin EC 325 MG EC tablet Take 1 tablet (325 mg total) by mouth daily. 30 tablet 0  . atenolol (TENORMIN) 25 MG tablet Take 1 tablet (25 mg total) by mouth daily. 30 tablet 11  . Clindamycin-Benzoyl Per, Refr, gel Apply 1 application topically 2 (two) times daily. 45 g 11  . LORazepam (ATIVAN) 0.5 MG tablet Take 0.5 mg by mouth 2 (two) times daily as needed. Take one tablet bid for anxiety    . rOPINIRole (REQUIP) 1 MG tablet Take 1 tablet (1 mg total) by mouth daily. 30 tablet 11  . vitamin B-12 (CYANOCOBALAMIN) 1000 MCG tablet Take 1,000 mcg by mouth daily.     No current facility-administered medications on file prior to visit.    ROS:  Out of a complete 14 system review of symptoms, the patient complains only of the following symptoms, and all other reviewed systems are negative.  Fatigue Restless legs, insomnia, frequent waking Walking difficulty Dizziness Nervousness, anxiety, depression  Blood pressure 151/89, pulse 56, height 6' (1.829 m), weight 210 lb 3.2 oz (95.346 kg).  Physical Exam  General: The patient is  alert and cooperative at the time of the examination.  Skin: No significant peripheral edema is noted.   Neurologic Exam  Mental status: The patient is oriented x 3.  Cranial nerves: Facial symmetry is present. Speech is normal, no aphasia or dysarthria is noted. Extraocular movements are full. Visual fields are full.  Motor: The patient has good strength in the left extremities. The patient holds the right arm in flexion, decreased grip strength is noted on the right, incomplete abduction of the right arm is noted, 4/5 strength in flexion and extension of the elbow noted on the right. The patient has an AFO brace on the right foot, increased motor tone on the right leg.  Sensory examination: Soft touch  sensation is symmetric on the face, arms, and legs.   Coordination: The patient has good finger-nose-finger and heel-to-shin on the left, has difficulty performing on the right.  Gait and station: The patient has a circumduction gait with the right leg. The patient holds the right arm in flexion with walking. Tandem gait was not attempted. Romberg is negative.  Reflexes: Deep tendon reflexes are slightly increased on the right arm and right leg relative to the left.   Assessment/Plan:  1. Left brain stroke, right hemiparesis  2. Gait disorder  3. History of fatigue  4. Anxiety and depression  5. Restless leg syndrome  6. Chronic insomnia  The patient will be increased on the trazodone taking 100 mg at night. He will continue the Requip for restless leg syndrome. Hopefully, the trazodone will help the anxiety, depression, and insomnia. He will have further blood work to include a methylmalonic acid level and a testosterone level to evaluate the chronic fatigue issues. He will follow-up in about 6 months. He is applying for disability.   Micheal Palau MD 04/28/2014 8:32 PM  [facility] [address] [place], [place] [address]  Phone [phone] Fax [phone]

## 2014-04-30 LAB — METHYLMALONIC ACID, SERUM: Methylmalonic Acid: 102 nmol/L (ref 0–378)

## 2014-05-01 ENCOUNTER — Other Ambulatory Visit: Payer: Self-pay | Admitting: Family Medicine

## 2014-05-01 ENCOUNTER — Encounter: Payer: Self-pay | Admitting: Family Medicine

## 2014-05-01 MED ORDER — CLONAZEPAM 0.5 MG PO TABS: ORAL_TABLET | ORAL | Status: DC

## 2014-05-30 DIAGNOSIS — Z0279 Encounter for issue of other medical certificate: Secondary | ICD-10-CM

## 2014-06-20 ENCOUNTER — Encounter: Payer: Self-pay | Admitting: Family Medicine

## 2014-06-20 ENCOUNTER — Other Ambulatory Visit: Payer: Self-pay | Admitting: Family Medicine

## 2014-06-20 NOTE — Telephone Encounter (Signed)
Is this okay to refill? 

## 2014-06-20 NOTE — Telephone Encounter (Signed)
[facility] called & I authorized refill per JCL's note

## 2014-07-21 ENCOUNTER — Other Ambulatory Visit: Payer: Self-pay

## 2014-07-21 ENCOUNTER — Other Ambulatory Visit: Payer: Self-pay | Admitting: Family Medicine

## 2014-07-21 NOTE — Telephone Encounter (Signed)
Is this okay?

## 2014-07-21 NOTE — Telephone Encounter (Signed)
Okay to renew

## 2014-07-24 ENCOUNTER — Ambulatory Visit (INDEPENDENT_AMBULATORY_CARE_PROVIDER_SITE_OTHER): Payer: 59 | Admitting: Family Medicine

## 2014-07-24 ENCOUNTER — Encounter: Payer: Self-pay | Admitting: Family Medicine

## 2014-07-24 VITALS — BP 122/72 | HR 60 | Temp 98.3°F | Ht 73.0 in | Wt 218.0 lb

## 2014-07-24 DIAGNOSIS — G2581 Restless legs syndrome: Secondary | ICD-10-CM | POA: Diagnosis not present

## 2014-07-24 DIAGNOSIS — J301 Allergic rhinitis due to pollen: Secondary | ICD-10-CM

## 2014-07-24 DIAGNOSIS — I10 Essential (primary) hypertension: Secondary | ICD-10-CM

## 2014-07-24 DIAGNOSIS — M6530 Trigger finger, unspecified finger: Secondary | ICD-10-CM | POA: Diagnosis not present

## 2014-07-24 DIAGNOSIS — I639 Cerebral infarction, unspecified: Secondary | ICD-10-CM

## 2014-07-24 DIAGNOSIS — Z87442 Personal history of urinary calculi: Secondary | ICD-10-CM

## 2014-07-24 DIAGNOSIS — Z0000 Encounter for general adult medical examination without abnormal findings: Secondary | ICD-10-CM | POA: Diagnosis not present

## 2014-07-24 DIAGNOSIS — Z23 Encounter for immunization: Secondary | ICD-10-CM | POA: Diagnosis not present

## 2014-07-24 DIAGNOSIS — G4700 Insomnia, unspecified: Secondary | ICD-10-CM

## 2014-07-24 LAB — LIPID PANEL
CHOLESTEROL: 164 mg/dL (ref 0–200)
HDL: 44 mg/dL (ref >=40)
LDL Cholesterol: 108 mg/dL — ABNORMAL HIGH (ref 0–99)
Total CHOL/HDL Ratio: 3.7 Ratio
Triglycerides: 58 mg/dL (ref <150)
VLDL: 12 mg/dL (ref 0–40)

## 2014-07-24 LAB — POCT URINALYSIS DIPSTICK
BILIRUBIN UA: NEGATIVE
Blood, UA: NEGATIVE
Glucose, UA: NEGATIVE
KETONES UA: NEGATIVE
Leukocytes, UA: NEGATIVE
NITRITE UA: NEGATIVE
PH UA: 6
PROTEIN UA: NEGATIVE
Spec Grav, UA: 1.025
Urobilinogen, UA: NEGATIVE

## 2014-07-24 LAB — CBC WITH DIFFERENTIAL/PLATELET
Basophils Absolute: 0 10*3/uL (ref 0.0–0.1)
Basophils Relative: 0 % (ref 0–1)
EOS PCT: 0 % (ref 0–5)
Eosinophils Absolute: 0 10*3/uL (ref 0.0–0.7)
HCT: 45 % (ref 39.0–52.0)
Hemoglobin: 15.7 g/dL (ref 13.0–17.0)
LYMPHS ABS: 1.7 10*3/uL (ref 0.7–4.0)
Lymphocytes Relative: 28 % (ref 12–46)
MCH: 32.2 pg (ref 26.0–34.0)
MCHC: 34.9 g/dL (ref 30.0–36.0)
MCV: 92.2 fL (ref 78.0–100.0)
MONOS PCT: 13 % — AB (ref 3–12)
MPV: 12.1 fL (ref 8.6–12.4)
Monocytes Absolute: 0.8 10*3/uL (ref 0.1–1.0)
NEUTROS PCT: 59 % (ref 43–77)
Neutro Abs: 3.5 10*3/uL (ref 1.7–7.7)
PLATELETS: 154 10*3/uL (ref 150–400)
RBC: 4.88 MIL/uL (ref 4.22–5.81)
RDW: 13.8 % (ref 11.5–15.5)
WBC: 6 10*3/uL (ref 4.0–10.5)

## 2014-07-24 LAB — COMPREHENSIVE METABOLIC PANEL
ALT: 10 U/L (ref 0–53)
AST: 15 U/L (ref 0–37)
Albumin: 4.4 g/dL (ref 3.5–5.2)
Alkaline Phosphatase: 49 U/L (ref 39–117)
BUN: 11 mg/dL (ref 6–23)
CHLORIDE: 100 meq/L (ref 96–112)
CO2: 25 mEq/L (ref 19–32)
Calcium: 9.4 mg/dL (ref 8.4–10.5)
Creat: 1.32 mg/dL (ref 0.50–1.35)
Glucose, Bld: 85 mg/dL (ref 70–99)
Potassium: 4 mEq/L (ref 3.5–5.3)
SODIUM: 136 meq/L (ref 135–145)
Total Bilirubin: 1.1 mg/dL (ref 0.2–1.2)
Total Protein: 7 g/dL (ref 6.0–8.3)

## 2014-07-24 MED ORDER — CLONAZEPAM 1 MG PO TABS: 1.0000 mg | ORAL_TABLET | Freq: Two times a day (BID) | ORAL | Status: DC | PRN

## 2014-07-24 NOTE — Progress Notes (Signed)
Subjective:    Patient ID: Micheal Gonzalez, male    DOB: [birth year], 54 y.o.   MRN: [id number]  HPI He is here for an annual exam. He reports a brief episode of chills and sweats yesterday evening at 6 pm, shortly after getting [place]. He thinks it was related to something he ate but denies nausea, vomiting, diarrhea or abdominal pain and states he felt fine when he woke up today. He states he has been well otherwise. Denies fatigue, unintentional weight loss, night sweats, cough, chest pain, palpitations, DOE, or GU problems. He also denies changes in bowel habits.   He continues to have residual weakness to his right arm and right leg from a CVA in June 2014. He is seeing a Systems analystpersonal trainer and continues to work on strength and ROM. He is currently wearing a brace on his LLE to assist him with walking. He no longer needs a cane.   He reports a history of trigger finger to his index and 2nd finger on his left hand. He saw Ortho while living in [place] in June of last year and had steroid injections to that hand. He is having issues again with those fingers and would like to be referred to a local Orthopedist.   RLS is well managed with Requip. He does continue to have difficulty sleeping through the night and does not think this is due to RLS. He reports sleeping 2 hours and being awake for 1-2 hours before going back to sleep for 3-4 more hours. He states he has had sleep studies in the past, while living in [place], and has been on as much as Klonopin 2 mg for sleep. He questions whether his current dose of Klonopin can be increased. He rarely takes Ativan for anxiety and states his mood is stable and he has a good outlook on life.   He has not had issues with renal stones since 2002. He does have seasonal allergies and takes Benadryl as needed for this. He states his personal trainer recommended that he start taking vitamin D and he would like to know if this would  be helpful.   He lives alone but is dating, works from home, is not a smoker and occasionally drinks alcohol.  He gets his eyes checked regularly. Last colonoscopy was 4 years ago. He does have a family history of colon cancer. Review of immunizations shows that he has not had a Tdap. Medications, social and family history reviewed as well as health maintenance.     Review of Systems  All other systems reviewed and are negative.      Objective:   Physical Exam  BP 122/72 mmHg  Pulse 60  Temp(Src) 98.3 F (36.8 C) (Oral)  Ht 6\' 1"  (1.854 m)  Wt 218 lb (98.884 kg)  BMI 28.77 kg/m2  SpO2 99%  General Appearance:    Alert, cooperative, no distress, appears stated age  Head:    Normocephalic, without obvious abnormality, atraumatic  Eyes:    PERRL, conjunctiva/corneas clear, EOM's intact, fundi    benign, both eyes       Ears:    Normal TM's and external ear canals, both ears  Nose:   Nares normal, septum midline, mucosa normal, no drainage   or sinus tenderness  Throat:   Lips, mucosa, and tongue normal; teeth and gums normal  Neck:   Supple, symmetrical, trachea midline, no adenopathy;       thyroid:  No  enlargement/tenderness/nodules  Back:     Symmetric, no curvature, ROM normal, no CVA tenderness  Lungs:     Clear to auscultation bilaterally, respirations unlabored  Chest wall:    No tenderness or deformity  Heart:    Regular rate and rhythm, S1 and S2 normal, no murmur, rub   or gallop  Abdomen:     Soft, non-tender, bowel sounds active all four quadrants,    no masses, no organomegaly  Genitalia:    Deferred  Rectal:    Deferred  Extremities:   RUE with notable weakness and limited ROM to abduction and extension, grip strength to right hand weaker than left. LUE normal strength and and ROM. RLE slightly weaker than LLE.  no cyanosis or edema.left index finger does move well but does get stuck in flexion  Pulses:   2+ and symmetric all extremities  Skin:   Skin color,  texture, turgor normal, no rashes or lesions  Lymph nodes:   Cervical, supraclavicular, and axillary nodes normal  Neurologic:   CNII-XII intact. Motor grossly intact. Normal sensation and reflexes throughout          Assessment & Plan:  Routine general medical examination at a health care facility - Plan: POCT Urinalysis Dipstick, CBC with Differential/Platelet, Comprehensive metabolic panel, Lipid panel  Essential hypertension  Cerebral infarction due to unspecified mechanism  RLS (restless legs syndrome) - Plan: clonazePAM (KLONOPIN) 1 MG tablet  Trigger finger - Plan: Ambulatory referral to Orthopedic Surgery  Insomnia, persistent - Plan: clonazePAM (KLONOPIN) 1 MG tablet  Allergic rhinitis due to pollen  History of renal stone  Need for prophylactic vaccination with combined diphtheria-tetanus-pertussis (DTP) vaccine - Plan: Tdap vaccine greater than or equal to 7yo IM  Stop taking the Ativan. I will increase his dose of Klonopin from 0.5 mg to 1 mg since he is having difficulty sleeping and has a history of taking 2 mg in the past. He will let me know how this affects his sleep. Continue other medications. Discussed that taking a good multivitamin with vitamin D would be beneficial. Referral to orthopedist for further evaluation and treatment of trigger finger.

## 2014-07-27 ENCOUNTER — Encounter: Payer: Self-pay | Admitting: Family Medicine

## 2014-07-28 ENCOUNTER — Other Ambulatory Visit: Payer: Self-pay | Admitting: Family Medicine

## 2014-07-28 MED ORDER — SCOPOLAMINE 1 MG/3DAYS TD PT72: 1.0000 | MEDICATED_PATCH | TRANSDERMAL | Status: DC

## 2014-08-04 ENCOUNTER — Encounter: Payer: Self-pay | Admitting: Family Medicine

## 2014-08-05 ENCOUNTER — Encounter: Payer: Self-pay | Admitting: Family Medicine

## 2014-08-06 ENCOUNTER — Other Ambulatory Visit: Payer: Self-pay | Admitting: Family Medicine

## 2014-08-06 NOTE — Telephone Encounter (Signed)
Is this okay to call in? 

## 2014-08-07 ENCOUNTER — Other Ambulatory Visit: Payer: Self-pay

## 2014-08-20 ENCOUNTER — Ambulatory Visit (INDEPENDENT_AMBULATORY_CARE_PROVIDER_SITE_OTHER): Payer: 59 | Admitting: Family Medicine

## 2014-08-20 ENCOUNTER — Encounter: Payer: Self-pay | Admitting: Family Medicine

## 2014-08-20 VITALS — BP 140/82 | HR 68 | Wt 223.0 lb

## 2014-08-20 DIAGNOSIS — R0609 Other forms of dyspnea: Secondary | ICD-10-CM

## 2014-08-20 MED ORDER — NITROGLYCERIN 0.4 MG SL SUBL: 0.4000 mg | SUBLINGUAL_TABLET | SUBLINGUAL | Status: DC | PRN

## 2014-08-20 NOTE — Progress Notes (Signed)
   Subjective:    Patient ID: Micheal LoaJeffrey A Gonzalez, male    DOB: Sep 13, 1960, 54 y.o.   MRN: [id number]  HPI He has a six-day history that started with shortness of breath , slight diaphoresis and burning sensation in his chest with physical activity. He notes this especially with going up steps.The symptoms last roughly 10 minutes He notes that when he walks at a regular pace, there is no trouble but faster pace causes similar symptoms work her. No PND or swelling in his lower extremities.No fever, chills, cough or congestion.He's had no previous episodes of this. His mother had CABG at age 55. He has had recent blood work.  Review of Systems     Objective:   Physical Exam Alert and in no distress. Tympanic membranes and canals are normal. Pharyngeal area is normal. Neck is supple without adenopathy or thyromegaly. Cardiac exam shows a regular sinus rhythm without murmurs or gallops. Lungs are clear to auscultation. EKG shows no acute changes     Assessment & Plan:  DOE (dyspnea on exertion) - Plan: EKG 12-Lead, Ambulatory referral to Cardiology, DG Chest 2 View, nitroGLYCERIN (NITROSTAT) 0.4 MG SL tablet Cautioned him on continued difficulty with chest discomfort and using nitroglycerin. His symptoms are certainly suggestive of new-onset angina.

## 2014-08-20 NOTE — Patient Instructions (Signed)
Take the nitroglycerin if your pain isn't gone within a minute or 2 after rest and if you have to repeat it more than twice then go right to the emergency room

## 2014-08-21 ENCOUNTER — Ambulatory Visit
Admission: RE | Admit: 2014-08-21 | Discharge: 2014-08-21 | Disposition: A | Discharge status: Home / Self Care | Payer: 59 | Source: Ambulatory Visit | Attending: Family Medicine | Admitting: Family Medicine

## 2014-08-21 DIAGNOSIS — R0609 Other forms of dyspnea: Principal | ICD-10-CM

## 2014-08-22 ENCOUNTER — Ambulatory Visit (INDEPENDENT_AMBULATORY_CARE_PROVIDER_SITE_OTHER): Payer: 59 | Admitting: Cardiovascular Disease

## 2014-08-22 ENCOUNTER — Encounter: Payer: Self-pay | Admitting: Cardiovascular Disease

## 2014-08-22 VITALS — BP 110/86 | HR 81 | Ht 73.0 in | Wt 215.4 lb

## 2014-08-22 DIAGNOSIS — I10 Essential (primary) hypertension: Secondary | ICD-10-CM | POA: Diagnosis not present

## 2014-08-22 DIAGNOSIS — R0789 Other chest pain: Secondary | ICD-10-CM

## 2014-08-22 DIAGNOSIS — I209 Angina pectoris, unspecified: Secondary | ICD-10-CM

## 2014-08-22 NOTE — Patient Instructions (Addendum)
Medication Instructions:  Your physician recommends that you continue on your current medications as directed. Please refer to the Current Medication list given to you today.   Labwork: None  Testing/Procedures: Your physician has requested that you have an exercise stress myoview. For further information please visit [email or web address]. Please follow instruction sheet, as given.   Follow-Up: Your physician recommends that you schedule a follow-up appointment in: as needed with Dr. Elease HashimotoNahser

## 2014-08-22 NOTE — Progress Notes (Signed)
Cardiology Office Note   Date:  08/22/2014   ID:  Micheal LoaJeffrey A Vogl, DOB 1960-12-16, MRN [id number]  PCP:  Carollee HerterLALONDE,JOHN CHARLES, MD  Cardiologist:   Vesta MixerNahser, Kam Kushnir J, MD   Chief Complaint  Patient presents with  . Chest Pain   Problem List: 1. Chest pressure.  2. CVA - June, 2014. 3. Essential HTN    History of Present Illness: Micheal Gonzalez is a 54 y.o. male who presents for chest pressure Goes up 3 flights of stairs at his appartment.  Last for several minutes.   Burning in his lungs after walking up the steps.   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   Past Medical History  Diagnosis Date  . Allergy   . ED (erectile dysfunction)   . RLS (restless legs syndrome)   . Folliculitis barbae   . Insomnia   . Gait disorder   . Hypertension   . History of stroke     Right hemiparesis  . Other vitamin B12 deficiency anemia 07/11/2013    Past Surgical History  Procedure Laterality Date  . Colonoscopy  03/25/11     Current Outpatient Prescriptions  Medication Sig Dispense Refill  . aspirin EC 325 MG EC tablet Take 1 tablet (325 mg total) by mouth daily. 30 tablet 0  . atenolol (TENORMIN) 25 MG tablet Take 1 tablet (25 mg total) by mouth daily. 30 tablet 11  . cholecalciferol (VITAMIN D) 1000 UNITS tablet Take 1,000 Units by mouth daily.    . clonazePAM (KLONOPIN) 1 MG tablet Take 1 tablet (1 mg total) by mouth 2 (two) times daily as needed for anxiety. 90 tablet 3  . losartan (COZAAR) 50 MG tablet Take 50 mg by mouth daily.    . Multiple Vitamins tablet Take 1 tablet by mouth daily.    . nitroGLYCERIN (NITROSTAT) 0.4 MG SL tablet Place 1 tablet (0.4 mg total) under the tongue every 5 (five) minutes as needed for chest pain. 50 tablet 3  . rOPINIRole (REQUIP) 1 MG tablet Take 1 tablet (1 mg total) by mouth daily. 30 tablet 11  . vitamin B-12 (CYANOCOBALAMIN) 1000 MCG tablet Take 1,000 mcg by mouth daily.    . Clindamycin-Benzoyl Per, Refr, gel Apply  1 application topically 2 (two) times daily. (Patient not taking: Reported on 08/20/2014) 45 g 11   No current facility-administered medications for this visit.    Allergies:   Review of patient's allergies indicates no known allergies.    Social History:  The patient  reports that he has never smoked. He has never used smokeless tobacco. He reports that he drinks about 1.0 oz of alcohol per week. He reports that he does not use illicit drugs.   Family History:  The patient's family history includes Cancer in his brother; Cancer (age of onset: 6749) in his sister; Cancer (age of onset: 3573) in his mother; Heart disease (age of onset: 7268) in his mother; Stroke (age of onset: 1961) in his sister.    ROS:  Please see the history of present illness.    Review of Systems: Constitutional:  denies fever, chills, diaphoresis, appetite change and fatigue.  HEENT: denies photophobia, eye pain, redness, hearing loss, ear pain, congestion, sore throat, rhinorrhea, sneezing, neck pain, neck stiffness and tinnitus.  Respiratory: denies SOB, DOE, cough, chest tightness, and wheezing.  Cardiovascular: denies chest pain, palpitations and leg swelling.  Gastrointestinal: denies nausea, vomiting, abdominal pain, diarrhea, constipation, blood in stool.  Genitourinary: denies  dysuria, urgency, frequency, hematuria, flank pain and difficulty urinating.  Musculoskeletal: denies  myalgias, back pain, joint swelling, arthralgias and gait problem.   Skin: denies pallor, rash and wound.  Neurological: denies dizziness, seizures, syncope, weakness, light-headedness, numbness and headaches.   Hematological: denies adenopathy, easy bruising, personal or family bleeding history.  Psychiatric/ Behavioral: denies suicidal ideation, mood changes, confusion, nervousness, sleep disturbance and agitation.       All other systems are reviewed and negative.    PHYSICAL EXAM: VS:  BP 110/86 mmHg  Pulse 81  Ht 6\' 1"  (1.854  m)  Wt 215 lb 6.4 oz (97.705 kg)  BMI 28.42 kg/m2 , BMI Body mass index is 28.42 kg/(m^2). GEN: Well nourished, well developed, in no acute distress HEENT: normal Neck: no JVD, carotid bruits, or masses Cardiac: RRR; no murmurs, rubs, or gallops,no edema  Respiratory:  clear to auscultation bilaterally, normal work of breathing GI: soft, nontender, nondistended, + BS MS: no deformity or atrophy Skin: warm and dry, no rash Neuro:  Strength and sensation are intact Psych: normal   EKG:  EKG is not ordered today. The ekg ordered by Dr. Susann GivensLalonde demonstrates no St or T wave changes ( reportedly )    Recent Labs: 07/24/2014: ALT 10; BUN 11; Creatinine 1.32; Hemoglobin 15.7; Platelets 154; Potassium 4.0; Sodium 136    Lipid Panel    Component Value Date/Time   CHOL 164 07/24/2014 0001   TRIG 58 07/24/2014 0001   HDL 44 07/24/2014 0001   CHOLHDL 3.7 07/24/2014 0001   VLDL 12 07/24/2014 0001   LDLCALC 108* 07/24/2014 0001      Wt Readings from Last 3 Encounters:  08/22/14 215 lb 6.4 oz (97.705 kg)  08/20/14 223 lb (101.152 kg)  07/24/14 218 lb (98.884 kg)      Other studies Reviewed: Additional studies/ records that were reviewed today include: . Review of the above records demonstrates:    ASSESSMENT AND PLAN:  1.  Chest discomfort - has significant burning in his chest with exertion. These symptoms are new. I'm concerned that he might have unstable angina. We will schedule him for a Lexiscan Myoview study. I'll see him on an as-needed basis assuming that [facility] is normal. If the Myoview is abnormal then we will need to refer him for cardiac catheterization.   Current medicines are reviewed at length with the patient today.  The patient does not have concerns regarding medicines.  The following changes have been made:  no change  Labs/ tests ordered today include:  No orders of the defined types were placed in this encounter.     Disposition:    FU with me as needed.      Sapphira Harjo, Deloris PingPhilip J, MD  08/22/2014 3:25 PM    [facility] [address], [place], [place]  [address] Phone: [phone]([phone]; Fax: [phone]   [facility]  [address] [place], [place]  [address] [phone]    Fax [phone]

## 2014-09-02 ENCOUNTER — Telehealth (HOSPITAL_COMMUNITY): Payer: Self-pay | Admitting: *Deleted

## 2014-09-02 NOTE — Telephone Encounter (Signed)
Patient given detailed instructions per Myocardial Perfusion Study Information Sheet for test on 09/03/14 at 0700. Patient verbalized understanding. Ariyannah Pauling, Adelene IdlerCynthia W

## 2014-09-03 ENCOUNTER — Other Ambulatory Visit: Payer: Self-pay | Admitting: Orthopedic Surgery

## 2014-09-03 ENCOUNTER — Ambulatory Visit (HOSPITAL_COMMUNITY): Payer: 59 | Attending: Cardiology

## 2014-09-03 DIAGNOSIS — I209 Angina pectoris, unspecified: Secondary | ICD-10-CM | POA: Insufficient documentation

## 2014-09-03 LAB — MYOCARDIAL PERFUSION IMAGING
CHL CUP RESTING HR STRESS: 43 {beats}/min
CHL CUP STRESS STAGE 1 DBP: 78 mmHg
CHL CUP STRESS STAGE 3 DBP: 91 mmHg
CHL CUP STRESS STAGE 3 GRADE: 0 %
CHL CUP STRESS STAGE 3 SBP: 134 mmHg
CHL CUP STRESS STAGE 3 SPEED: 0 mph
CHL CUP STRESS STAGE 4 HR: 83 {beats}/min
CHL CUP STRESS STAGE 6 DBP: 79 mmHg
CHL CUP STRESS STAGE 6 SBP: 123 mmHg
CHL CUP STRESS STAGE 6 SPEED: 0 mph
CSEPPMHR: 50 %
Estimated workload: 1 METS
LV dias vol: 125 mL
LVSYSVOL: 55 mL
NUC STRESS EF: 56 %
NUC STRESS TID: 0.94
Peak HR: 83 {beats}/min
RATE: 0.35
SDS: 3
SRS: 5
SSS: 8
Stage 1 Grade: 0 %
Stage 1 HR: 45 {beats}/min
Stage 1 SBP: 122 mmHg
Stage 1 Speed: 0 mph
Stage 2 Grade: 0 %
Stage 2 HR: 45 {beats}/min
Stage 2 Speed: 0 mph
Stage 3 HR: 59 {beats}/min
Stage 4 Grade: 0 %
Stage 4 Speed: 0 mph
Stage 5 DBP: 84 mmHg
Stage 5 Grade: 0 %
Stage 5 HR: 76 {beats}/min
Stage 5 SBP: 116 mmHg
Stage 5 Speed: 0 mph
Stage 6 Grade: 0 %

## 2014-09-03 MED ORDER — TECHNETIUM TC 99M SESTAMIBI GENERIC - CARDIOLITE
11.0000 | Freq: Once | INTRAVENOUS | Status: AC | PRN
  Administered 2014-09-03: 11 via INTRAVENOUS

## 2014-09-03 MED ORDER — TECHNETIUM TC 99M SESTAMIBI GENERIC - CARDIOLITE
33.0000 | Freq: Once | INTRAVENOUS | Status: AC | PRN
  Administered 2014-09-03: 33 via INTRAVENOUS

## 2014-09-03 MED ORDER — REGADENOSON 0.4 MG/5ML IV SOLN
0.4000 mg | Freq: Once | INTRAVENOUS | Status: AC
  Administered 2014-09-03: 0.4 mg via INTRAVENOUS

## 2014-09-04 ENCOUNTER — Encounter: Payer: Self-pay | Admitting: Nurse Practitioner

## 2014-09-04 ENCOUNTER — Telehealth: Payer: Self-pay | Admitting: Nurse Practitioner

## 2014-09-04 DIAGNOSIS — I209 Angina pectoris, unspecified: Secondary | ICD-10-CM

## 2014-09-04 NOTE — Telephone Encounter (Signed)
Spoke with patient to schedule cardiac cath.  Patient scheduled for Monday, May 16 at 0900 with Dr. Excell Seltzerooper.  He is scheduled for lab appointment tomorrow, Friday 5/13.  I reviewed pre-cath instructions with patient and his significant other who will drive him to the procedure on Monday.  They verbalized understanding and I advised I have placed a copy of these instructions at front desk for pick up tomorrow.  I advised patient to call back with questions or concerns and he verbalized understanding and agreement.

## 2014-09-04 NOTE — Telephone Encounter (Signed)
-----   Message from Vesta MixerPhilip J Nahser, MD sent at 09/03/2014  6:10 PM EDT ----- I have called patient. He is still having angina.  myoview is low risk but does suggest ischemia in the LCx distribution. Will set him up for a cath .

## 2014-09-05 ENCOUNTER — Other Ambulatory Visit (INDEPENDENT_AMBULATORY_CARE_PROVIDER_SITE_OTHER): Payer: 59 | Admitting: *Deleted

## 2014-09-05 ENCOUNTER — Other Ambulatory Visit: Payer: Self-pay | Admitting: Cardiovascular Disease

## 2014-09-05 DIAGNOSIS — I209 Angina pectoris, unspecified: Secondary | ICD-10-CM

## 2014-09-05 LAB — CBC WITH DIFFERENTIAL/PLATELET
BASOS ABS: 0 10*3/uL (ref 0.0–0.1)
Basophils Relative: 0.5 % (ref 0.0–3.0)
EOS ABS: 0.1 10*3/uL (ref 0.0–0.7)
Eosinophils Relative: 1.3 % (ref 0.0–5.0)
HCT: 42.9 % (ref 39.0–52.0)
Hemoglobin: 15 g/dL (ref 13.0–17.0)
Lymphocytes Relative: 42.2 % (ref 12.0–46.0)
Lymphs Abs: 2.2 10*3/uL (ref 0.7–4.0)
MCHC: 34.8 g/dL (ref 30.0–36.0)
MCV: 92 fl (ref 78.0–100.0)
MONO ABS: 0.5 10*3/uL (ref 0.1–1.0)
MONOS PCT: 10.5 % (ref 3.0–12.0)
NEUTROS PCT: 45.5 % (ref 43.0–77.0)
Neutro Abs: 2.4 10*3/uL (ref 1.4–7.7)
PLATELETS: 154 10*3/uL (ref 150.0–400.0)
RBC: 4.66 Mil/uL (ref 4.22–5.81)
RDW: 14.2 % (ref 11.5–15.5)
WBC: 5.2 10*3/uL (ref 4.0–10.5)

## 2014-09-05 LAB — BASIC METABOLIC PANEL
BUN: 9 mg/dL (ref 6–23)
CO2: 26 meq/L (ref 19–32)
CREATININE: 1.22 mg/dL (ref 0.40–1.50)
Calcium: 9.1 mg/dL (ref 8.4–10.5)
Chloride: 105 mEq/L (ref 96–112)
GFR: 79.29 mL/min (ref >60.00)
Glucose, Bld: 93 mg/dL (ref 70–99)
POTASSIUM: 3.7 meq/L (ref 3.5–5.1)
SODIUM: 136 meq/L (ref 135–145)

## 2014-09-05 LAB — PROTIME-INR
INR: 1 ratio (ref 0.8–1.0)
PROTHROMBIN TIME: 11.3 s (ref 9.6–13.1)

## 2014-09-07 MED ORDER — CHLORHEXIDINE GLUCONATE 4 % EX LIQD
60.0000 mL | Freq: Once | CUTANEOUS | Status: DC
  Filled 2014-09-07: qty 60

## 2014-09-07 MED ORDER — CEFAZOLIN SODIUM-DEXTROSE 2-3 GM-% IV SOLR: 2.0000 g | INTRAVENOUS | Status: DC

## 2014-09-08 ENCOUNTER — Other Ambulatory Visit: Payer: Self-pay | Admitting: *Deleted

## 2014-09-08 ENCOUNTER — Inpatient Hospital Stay (HOSPITAL_COMMUNITY)
Admission: RE | Admit: 2014-09-08 | Discharge: 2014-09-09 | DRG: 287 | Disposition: A | Discharge status: Home / Self Care | Payer: 59 | Source: Ambulatory Visit | Attending: Cardiovascular Disease | Admitting: Cardiovascular Disease

## 2014-09-08 ENCOUNTER — Encounter (HOSPITAL_COMMUNITY)
Admission: RE | Disposition: A | Discharge status: Home / Self Care | Payer: 59 | Source: Ambulatory Visit | Attending: Cardiovascular Disease

## 2014-09-08 ENCOUNTER — Encounter (HOSPITAL_COMMUNITY): Payer: Self-pay | Admitting: Cardiovascular Disease

## 2014-09-08 DIAGNOSIS — I2510 Atherosclerotic heart disease of native coronary artery without angina pectoris: Secondary | ICD-10-CM | POA: Insufficient documentation

## 2014-09-08 DIAGNOSIS — Z7982 Long term (current) use of aspirin: Secondary | ICD-10-CM

## 2014-09-08 DIAGNOSIS — I200 Unstable angina: Secondary | ICD-10-CM | POA: Diagnosis not present

## 2014-09-08 DIAGNOSIS — R0789 Other chest pain: Secondary | ICD-10-CM | POA: Diagnosis present

## 2014-09-08 DIAGNOSIS — I2583 Coronary atherosclerosis due to lipid rich plaque: Secondary | ICD-10-CM

## 2014-09-08 DIAGNOSIS — E785 Hyperlipidemia, unspecified: Secondary | ICD-10-CM | POA: Insufficient documentation

## 2014-09-08 DIAGNOSIS — I25110 Atherosclerotic heart disease of native coronary artery with unstable angina pectoris: Secondary | ICD-10-CM | POA: Diagnosis present

## 2014-09-08 DIAGNOSIS — I10 Essential (primary) hypertension: Secondary | ICD-10-CM | POA: Diagnosis present

## 2014-09-08 DIAGNOSIS — I2089 Other forms of angina pectoris: Secondary | ICD-10-CM | POA: Diagnosis present

## 2014-09-08 DIAGNOSIS — G8191 Hemiplegia, unspecified affecting right dominant side: Secondary | ICD-10-CM | POA: Diagnosis present

## 2014-09-08 DIAGNOSIS — I214 Non-ST elevation (NSTEMI) myocardial infarction: Secondary | ICD-10-CM | POA: Insufficient documentation

## 2014-09-08 DIAGNOSIS — Z79899 Other long term (current) drug therapy: Secondary | ICD-10-CM

## 2014-09-08 DIAGNOSIS — I208 Other forms of angina pectoris: Secondary | ICD-10-CM

## 2014-09-08 HISTORY — DX: Other forms of angina pectoris: I20.8

## 2014-09-08 HISTORY — DX: Other forms of angina pectoris: I20.89

## 2014-09-08 HISTORY — PX: CARDIAC CATHETERIZATION: SHX172

## 2014-09-08 LAB — MRSA PCR SCREENING: MRSA by PCR: NEGATIVE

## 2014-09-08 SURGERY — LEFT HEART CATH AND CORONARY ANGIOGRAPHY

## 2014-09-08 MED ORDER — VERAPAMIL HCL 2.5 MG/ML IV SOLN
INTRAVENOUS | Status: AC
  Filled 2014-09-08: qty 2

## 2014-09-08 MED ORDER — LIDOCAINE HCL (PF) 1 % IJ SOLN
INTRAMUSCULAR | Status: AC
  Filled 2014-09-08: qty 30

## 2014-09-08 MED ORDER — ATENOLOL 25 MG PO TABS
25.0000 mg | ORAL_TABLET | Freq: Every day | ORAL | Status: DC
  Administered 2014-09-08 – 2014-09-09 (×2): 25 mg via ORAL
  Filled 2014-09-08 (×2): qty 1

## 2014-09-08 MED ORDER — ASPIRIN 81 MG PO CHEW: 81.0000 mg | CHEWABLE_TABLET | ORAL | Status: DC

## 2014-09-08 MED ORDER — ACETAMINOPHEN 325 MG PO TABS: 650.0000 mg | ORAL_TABLET | ORAL | Status: DC | PRN

## 2014-09-08 MED ORDER — SODIUM CHLORIDE 0.9 % IV SOLN: 250.0000 mL | INTRAVENOUS | Status: DC | PRN

## 2014-09-08 MED ORDER — HEPARIN SODIUM (PORCINE) 1000 UNIT/ML IJ SOLN
INTRAMUSCULAR | Status: DC | PRN
  Administered 2014-09-08: 5000 [IU] via INTRAVENOUS

## 2014-09-08 MED ORDER — ATORVASTATIN CALCIUM 80 MG PO TABS
80.0000 mg | ORAL_TABLET | Freq: Every day | ORAL | Status: DC
  Administered 2014-09-08: 80 mg via ORAL
  Filled 2014-09-08 (×2): qty 1

## 2014-09-08 MED ORDER — VITAMIN D3 25 MCG (1000 UNIT) PO TABS
1000.0000 [IU] | ORAL_TABLET | Freq: Every day | ORAL | Status: DC
  Administered 2014-09-08 – 2014-09-09 (×2): 1000 [IU] via ORAL
  Filled 2014-09-08 (×2): qty 1

## 2014-09-08 MED ORDER — LOSARTAN POTASSIUM 50 MG PO TABS
50.0000 mg | ORAL_TABLET | Freq: Every day | ORAL | Status: DC
  Administered 2014-09-09: 50 mg via ORAL
  Filled 2014-09-08: qty 1

## 2014-09-08 MED ORDER — CLONAZEPAM 1 MG PO TABS
1.0000 mg | ORAL_TABLET | Freq: Every day | ORAL | Status: DC
  Administered 2014-09-08: 1 mg via ORAL
  Filled 2014-09-08: qty 1

## 2014-09-08 MED ORDER — OXYCODONE-ACETAMINOPHEN 5-325 MG PO TABS: 1.0000 | ORAL_TABLET | ORAL | Status: DC | PRN

## 2014-09-08 MED ORDER — LABETALOL HCL 5 MG/ML IV SOLN: 20.0000 mg | INTRAVENOUS | Status: DC | PRN

## 2014-09-08 MED ORDER — HYDRALAZINE HCL 20 MG/ML IJ SOLN
10.0000 mg | Freq: Four times a day (QID) | INTRAMUSCULAR | Status: DC | PRN
  Administered 2014-09-08: 10 mg via INTRAVENOUS
  Filled 2014-09-08: qty 1

## 2014-09-08 MED ORDER — NITROGLYCERIN IN D5W 200-5 MCG/ML-% IV SOLN
2.0000 ug/min | INTRAVENOUS | Status: DC
  Administered 2014-09-08: 10 ug/min via INTRAVENOUS
  Filled 2014-09-08: qty 250

## 2014-09-08 MED ORDER — ADULT MULTIVITAMIN W/MINERALS CH
1.0000 | ORAL_TABLET | Freq: Every day | ORAL | Status: DC
  Administered 2014-09-08 – 2014-09-09 (×2): 1 via ORAL
  Filled 2014-09-08 (×2): qty 1

## 2014-09-08 MED ORDER — HEPARIN SODIUM (PORCINE) 1000 UNIT/ML IJ SOLN
INTRAMUSCULAR | Status: AC
  Filled 2014-09-08: qty 1

## 2014-09-08 MED ORDER — SODIUM CHLORIDE 0.9 % IJ SOLN: 3.0000 mL | Freq: Two times a day (BID) | INTRAMUSCULAR | Status: DC

## 2014-09-08 MED ORDER — ALUM & MAG HYDROXIDE-SIMETH 200-200-20 MG/5ML PO SUSP
15.0000 mL | ORAL | Status: DC | PRN
  Filled 2014-09-08: qty 30

## 2014-09-08 MED ORDER — HEPARIN (PORCINE) IN NACL 100-0.45 UNIT/ML-% IJ SOLN
1700.0000 [IU]/h | INTRAMUSCULAR | Status: DC
  Administered 2014-09-08: 1400 [IU]/h via INTRAVENOUS
  Administered 2014-09-09: 1700 [IU]/h via INTRAVENOUS
  Filled 2014-09-08 (×2): qty 250

## 2014-09-08 MED ORDER — VERAPAMIL HCL 2.5 MG/ML IV SOLN
INTRAVENOUS | Status: DC | PRN
  Administered 2014-09-08: 14:00:00 via INTRA_ARTERIAL

## 2014-09-08 MED ORDER — SODIUM CHLORIDE 0.9 % IJ SOLN: 3.0000 mL | INTRAMUSCULAR | Status: DC | PRN

## 2014-09-08 MED ORDER — HEPARIN (PORCINE) IN NACL 2-0.9 UNIT/ML-% IJ SOLN
INTRAMUSCULAR | Status: AC
  Filled 2014-09-08: qty 500

## 2014-09-08 MED ORDER — NITROGLYCERIN 1 MG/10 ML FOR IR/CATH LAB
INTRA_ARTERIAL | Status: AC
  Filled 2014-09-08: qty 10

## 2014-09-08 MED ORDER — SODIUM CHLORIDE 0.9 % WEIGHT BASED INFUSION: 1.0000 mL/kg/h | INTRAVENOUS | Status: DC

## 2014-09-08 MED ORDER — FENTANYL CITRATE (PF) 100 MCG/2ML IJ SOLN
INTRAMUSCULAR | Status: AC
  Filled 2014-09-08: qty 2

## 2014-09-08 MED ORDER — ALUM & MAG HYDROXIDE-SIMETH 200-200-20 MG/5ML PO SUSP
30.0000 mL | ORAL | Status: DC | PRN
  Administered 2014-09-08: 30 mL via ORAL

## 2014-09-08 MED ORDER — MIDAZOLAM HCL 2 MG/2ML IJ SOLN
INTRAMUSCULAR | Status: AC
  Filled 2014-09-08: qty 2

## 2014-09-08 MED ORDER — DIAZEPAM 5 MG PO TABS
5.0000 mg | ORAL_TABLET | Freq: Four times a day (QID) | ORAL | Status: DC | PRN
Start: 2014-09-08 — End: 2014-09-09

## 2014-09-08 MED ORDER — FENTANYL CITRATE (PF) 100 MCG/2ML IJ SOLN
INTRAMUSCULAR | Status: DC | PRN
  Administered 2014-09-08: 25 ug via INTRAVENOUS

## 2014-09-08 MED ORDER — ONDANSETRON HCL 4 MG/2ML IJ SOLN: 4.0000 mg | Freq: Four times a day (QID) | INTRAMUSCULAR | Status: DC | PRN

## 2014-09-08 MED ORDER — SODIUM CHLORIDE 0.9 % WEIGHT BASED INFUSION
3.0000 mL/kg/h | INTRAVENOUS | Status: DC
  Administered 2014-09-08: 3 mL/kg/h via INTRAVENOUS

## 2014-09-08 MED ORDER — SODIUM CHLORIDE 0.9 % IV SOLN: INTRAVENOUS | Status: AC

## 2014-09-08 MED ORDER — IOHEXOL 350 MG/ML SOLN
INTRAVENOUS | Status: DC | PRN
  Administered 2014-09-08: 60 mL via INTRACARDIAC

## 2014-09-08 MED ORDER — ROPINIROLE HCL 1 MG PO TABS
1.0000 mg | ORAL_TABLET | Freq: Every day | ORAL | Status: DC
  Administered 2014-09-08: 1 mg via ORAL
  Filled 2014-09-08 (×2): qty 1

## 2014-09-08 MED ORDER — MIDAZOLAM HCL 2 MG/2ML IJ SOLN
INTRAMUSCULAR | Status: DC | PRN
  Administered 2014-09-08: 2 mg via INTRAVENOUS

## 2014-09-08 MED ORDER — SODIUM CHLORIDE 0.9 % IJ SOLN
3.0000 mL | Freq: Two times a day (BID) | INTRAMUSCULAR | Status: DC
  Administered 2014-09-08 – 2014-09-09 (×2): 3 mL via INTRAVENOUS

## 2014-09-08 MED ORDER — HEPARIN (PORCINE) IN NACL 2-0.9 UNIT/ML-% IJ SOLN
INTRAMUSCULAR | Status: AC
  Filled 2014-09-08: qty 1000

## 2014-09-08 MED ORDER — ASPIRIN EC 325 MG PO TBEC
325.0000 mg | DELAYED_RELEASE_TABLET | Freq: Every day | ORAL | Status: DC
  Administered 2014-09-09: 325 mg via ORAL
  Filled 2014-09-08: qty 1

## 2014-09-08 SURGICAL SUPPLY — 16 items
CATH INFINITI 5 FR AL2 (CATHETERS) ×3 IMPLANT
CATH INFINITI 5 FR JL3.5 (CATHETERS) IMPLANT
CATH INFINITI 5FR ANG PIGTAIL (CATHETERS) IMPLANT
CATH INFINITI 5FR JL5 (CATHETERS) ×3 IMPLANT
CATH INFINITI 5FR MULTPACK ANG (CATHETERS) ×3 IMPLANT
CATH INFINITI JR4 5F (CATHETERS) IMPLANT
DEVICE RAD COMP TR BAND LRG (VASCULAR PRODUCTS) ×3 IMPLANT
GLIDESHEATH SLEND SS 6F .021 (SHEATH) ×3 IMPLANT
KIT HEART LEFT (KITS) ×3 IMPLANT
PACK CARDIAC CATHETERIZATION (CUSTOM PROCEDURE TRAY) ×3 IMPLANT
SHEATH PINNACLE 5F 10CM (SHEATH) IMPLANT
SYR MEDRAD MARK V 150ML (SYRINGE) ×3 IMPLANT
TRANSDUCER W/STOPCOCK (MISCELLANEOUS) ×3 IMPLANT
TUBING CIL FLEX 10 FLL-RA (TUBING) ×3 IMPLANT
WIRE EMERALD 3MM-J .035X150CM (WIRE) IMPLANT
WIRE SAFE-T 1.5MM-J .035X260CM (WIRE) ×3 IMPLANT

## 2014-09-08 NOTE — Progress Notes (Signed)
Spoke with patient and wife this evening at length. Reviewed findings of cath, treatment plan, and options of multivessel PCI versus CABG. They prefer to be treated at [facility] (live in [place], [place] and have not had good experiences at [facility] in past). Will ask case manager to see first thing in the morning to look into logistics of transfer. Will need to be transferred via EMS on IV heparin and NTG. Dr Dorris FetchHendrickson notified.   Micheal Gonzalez, Micheal Gonzalez 09/08/2014 11:43 PM

## 2014-09-08 NOTE — Progress Notes (Signed)
TR Band removed tolerated well, gauze 4x4 and tegaderm applied. Instructed to notify for any signs of bleeding, left arm restrictions emphasized, continue to monitor.

## 2014-09-08 NOTE — Consult Note (Signed)
ANTICOAGULATION CONSULT NOTE - Initial Consult  Pharmacy Consult for Heparin Indication: severe 3v CAD  No Known Allergies  Patient Measurements: Height: 6' (182.9 cm) Weight: 224 lb (101.606 kg) IBW/kg (Calculated) : 77.6 Heparin Dosing Weight: ~98kg  Vital Signs: Temp: 97.4 F (36.3 C) (05/16 0715) Temp Source: Oral (05/16 0715) BP: 169/94 mmHg (05/16 1520) Pulse Rate: 51 (05/16 1520)  Labs: No results for input(s): HGB, HCT, PLT, APTT, LABPROT, INR, HEPARINUNFRC, CREATININE, CKTOTAL, CKMB, TROPONINI in the last 72 hours.  Estimated Creatinine Clearance: 84.4 mL/min (by C-G formula based on Cr of 1.22).   Medical History: Past Medical History  Diagnosis Date  . Allergy   . ED (erectile dysfunction)   . RLS (restless legs syndrome)   . Folliculitis barbae   . Insomnia   . Gait disorder   . Hypertension   . History of stroke     Right hemiparesis  . Other vitamin B12 deficiency anemia 07/11/2013  . Exertional angina 09/08/2014    Medications:  No anticoagulation pta  Assessment: 54yom s/p cath found to have severe 3v CAD. He will begin IV heparin pending CVTS consultation for CABG. No labs for this admission yet but they were wnl on 09/05/14.  Heparin to begin 2 hours post TR band removal. Per RN, heparin should start ~1800.  Goal of Therapy:  Heparin level 0.3-0.7 units/ml Monitor platelets by anticoagulation protocol: Yes   Plan:  1) At 1800, begin heparin at 1400 units/hr 2) Check 6 hour heparin level 3) Daily heparin level and CBC 4) Follow up CVTS consult  Fredrik RiggerMarkle, Marykathleen Russi Sue 09/08/2014,3:55 PM

## 2014-09-08 NOTE — Interval H&P Note (Signed)
History and Physical Interval Note:  09/08/2014 1:53 PM  Micheal Gonzalez  has presented today for surgery, with the diagnosis of angina  The various methods of treatment have been discussed with the patient and family. After consideration of risks, benefits and other options for treatment, the patient has consented to  Procedure(s): Left Heart Cath and Coronary Angiography (N/A) as a surgical intervention .  The patient's history has been reviewed, patient examined, no change in status, stable for surgery.  I have reviewed the patient's chart and labs.  Questions were answered to the patient's satisfaction.    Cath Lab Visit (complete for each Cath Lab visit)  Clinical Evaluation Leading to the Procedure:   ACS: No.  Non-ACS:    Anginal Classification: CCS III  Anti-ischemic medical therapy: Minimal Therapy (1 class of medications)  Non-Invasive Test Results: Low-risk stress test findings: cardiac mortality <1%/year  Prior CABG: No previous CABG       Tonny Bollmanooper, Micheal Gonzalez

## 2014-09-08 NOTE — Progress Notes (Signed)
Admission from the cath.lab awake and alert. Left TRBand  intact. encouraged to elevate left arm with pillow and avoid moving the arm and to notify for any signs of bleeding.

## 2014-09-08 NOTE — H&P (View-Only) (Signed)
Cardiology Office Note   Date:  08/22/2014   ID:  Micheal LoaJeffrey A Vogl, DOB 1960-12-16, MRN [id number]  PCP:  Carollee HerterLALONDE,JOHN CHARLES, MD  Cardiologist:   Vesta MixerNahser, Zakyra Kukuk J, MD   Chief Complaint  Patient presents with  . Chest Pain   Problem List: 1. Chest pressure.  2. CVA - June, 2014. 3. Essential HTN    History of Present Illness: Micheal Gonzalez is a 54 y.o. male who presents for chest pressure Goes up 3 flights of stairs at his appartment.  Last for several minutes.   Burning in his lungs after walking up the steps.   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   Past Medical History  Diagnosis Date  . Allergy   . ED (erectile dysfunction)   . RLS (restless legs syndrome)   . Folliculitis barbae   . Insomnia   . Gait disorder   . Hypertension   . History of stroke     Right hemiparesis  . Other vitamin B12 deficiency anemia 07/11/2013    Past Surgical History  Procedure Laterality Date  . Colonoscopy  03/25/11     Current Outpatient Prescriptions  Medication Sig Dispense Refill  . aspirin EC 325 MG EC tablet Take 1 tablet (325 mg total) by mouth daily. 30 tablet 0  . atenolol (TENORMIN) 25 MG tablet Take 1 tablet (25 mg total) by mouth daily. 30 tablet 11  . cholecalciferol (VITAMIN D) 1000 UNITS tablet Take 1,000 Units by mouth daily.    . clonazePAM (KLONOPIN) 1 MG tablet Take 1 tablet (1 mg total) by mouth 2 (two) times daily as needed for anxiety. 90 tablet 3  . losartan (COZAAR) 50 MG tablet Take 50 mg by mouth daily.    . Multiple Vitamins tablet Take 1 tablet by mouth daily.    . nitroGLYCERIN (NITROSTAT) 0.4 MG SL tablet Place 1 tablet (0.4 mg total) under the tongue every 5 (five) minutes as needed for chest pain. 50 tablet 3  . rOPINIRole (REQUIP) 1 MG tablet Take 1 tablet (1 mg total) by mouth daily. 30 tablet 11  . vitamin B-12 (CYANOCOBALAMIN) 1000 MCG tablet Take 1,000 mcg by mouth daily.    . Clindamycin-Benzoyl Per, Refr, gel Apply  1 application topically 2 (two) times daily. (Patient not taking: Reported on 08/20/2014) 45 g 11   No current facility-administered medications for this visit.    Allergies:   Review of patient's allergies indicates no known allergies.    Social History:  The patient  reports that he has never smoked. He has never used smokeless tobacco. He reports that he drinks about 1.0 oz of alcohol per week. He reports that he does not use illicit drugs.   Family History:  The patient's family history includes Cancer in his brother; Cancer (age of onset: 6749) in his sister; Cancer (age of onset: 3573) in his mother; Heart disease (age of onset: 7268) in his mother; Stroke (age of onset: 1961) in his sister.    ROS:  Please see the history of present illness.    Review of Systems: Constitutional:  denies fever, chills, diaphoresis, appetite change and fatigue.  HEENT: denies photophobia, eye pain, redness, hearing loss, ear pain, congestion, sore throat, rhinorrhea, sneezing, neck pain, neck stiffness and tinnitus.  Respiratory: denies SOB, DOE, cough, chest tightness, and wheezing.  Cardiovascular: denies chest pain, palpitations and leg swelling.  Gastrointestinal: denies nausea, vomiting, abdominal pain, diarrhea, constipation, blood in stool.  Genitourinary: denies  dysuria, urgency, frequency, hematuria, flank pain and difficulty urinating.  Musculoskeletal: denies  myalgias, back pain, joint swelling, arthralgias and gait problem.   Skin: denies pallor, rash and wound.  Neurological: denies dizziness, seizures, syncope, weakness, light-headedness, numbness and headaches.   Hematological: denies adenopathy, easy bruising, personal or family bleeding history.  Psychiatric/ Behavioral: denies suicidal ideation, mood changes, confusion, nervousness, sleep disturbance and agitation.       All other systems are reviewed and negative.    PHYSICAL EXAM: VS:  BP 110/86 mmHg  Pulse 81  Ht 6' 1" (1.854  m)  Wt 215 lb 6.4 oz (97.705 kg)  BMI 28.42 kg/m2 , BMI Body mass index is 28.42 kg/(m^2). GEN: Well nourished, well developed, in no acute distress HEENT: normal Neck: no JVD, carotid bruits, or masses Cardiac: RRR; no murmurs, rubs, or gallops,no edema  Respiratory:  clear to auscultation bilaterally, normal work of breathing GI: soft, nontender, nondistended, + BS MS: no deformity or atrophy Skin: warm and dry, no rash Neuro:  Strength and sensation are intact Psych: normal   EKG:  EKG is not ordered today. The ekg ordered by Dr. Lalonde demonstrates no St or T wave changes ( reportedly )    Recent Labs: 07/24/2014: ALT 10; BUN 11; Creatinine 1.32; Hemoglobin 15.7; Platelets 154; Potassium 4.0; Sodium 136    Lipid Panel    Component Value Date/Time   CHOL 164 07/24/2014 0001   TRIG 58 07/24/2014 0001   HDL 44 07/24/2014 0001   CHOLHDL 3.7 07/24/2014 0001   VLDL 12 07/24/2014 0001   LDLCALC 108* 07/24/2014 0001      Wt Readings from Last 3 Encounters:  08/22/14 215 lb 6.4 oz (97.705 kg)  08/20/14 223 lb (101.152 kg)  07/24/14 218 lb (98.884 kg)      Other studies Reviewed: Additional studies/ records that were reviewed today include: . Review of the above records demonstrates:    ASSESSMENT AND PLAN:  1.  Chest discomfort - has significant burning in his chest with exertion. These symptoms are new. I'm concerned that he might have unstable angina. We will schedule him for a Lexiscan Myoview study. I'll see him on an as-needed basis assuming that the Lexiscan Myoview study is normal. If the Myoview is abnormal then we will need to refer him for cardiac catheterization.   Current medicines are reviewed at length with the patient today.  The patient does not have concerns regarding medicines.  The following changes have been made:  no change  Labs/ tests ordered today include:  No orders of the defined types were placed in this encounter.     Disposition:    FU with me as needed.      Jailani Hogans J, MD  08/22/2014 3:25 PM    [facility] [address], [place], [place]  [address] Phone: [phone]; Fax: [phone]   [place] Office  [address] [place], [place]  [address] [phone]    Fax [phone]    

## 2014-09-09 ENCOUNTER — Other Ambulatory Visit (HOSPITAL_COMMUNITY): Payer: 59

## 2014-09-09 ENCOUNTER — Ambulatory Visit (HOSPITAL_COMMUNITY): Payer: 59

## 2014-09-09 ENCOUNTER — Encounter (HOSPITAL_BASED_OUTPATIENT_CLINIC_OR_DEPARTMENT_OTHER): Admission: RE | Payer: Self-pay | Source: Ambulatory Visit

## 2014-09-09 ENCOUNTER — Encounter (HOSPITAL_COMMUNITY): Payer: 59

## 2014-09-09 ENCOUNTER — Encounter (HOSPITAL_COMMUNITY): Payer: Self-pay | Admitting: Cardiovascular Disease

## 2014-09-09 DIAGNOSIS — I200 Unstable angina: Secondary | ICD-10-CM

## 2014-09-09 DIAGNOSIS — I10 Essential (primary) hypertension: Secondary | ICD-10-CM | POA: Insufficient documentation

## 2014-09-09 DIAGNOSIS — I214 Non-ST elevation (NSTEMI) myocardial infarction: Secondary | ICD-10-CM | POA: Insufficient documentation

## 2014-09-09 DIAGNOSIS — I2510 Atherosclerotic heart disease of native coronary artery without angina pectoris: Secondary | ICD-10-CM | POA: Insufficient documentation

## 2014-09-09 DIAGNOSIS — I2583 Coronary atherosclerosis due to lipid rich plaque: Secondary | ICD-10-CM

## 2014-09-09 DIAGNOSIS — E785 Hyperlipidemia, unspecified: Secondary | ICD-10-CM | POA: Insufficient documentation

## 2014-09-09 LAB — CBC
HCT: 42.6 % (ref 39.0–52.0)
Hemoglobin: 14.4 g/dL (ref 13.0–17.0)
MCH: 31.6 pg (ref 26.0–34.0)
MCHC: 33.8 g/dL (ref 30.0–36.0)
MCV: 93.6 fL (ref 78.0–100.0)
PLATELETS: 144 10*3/uL — AB (ref 150–400)
RBC: 4.55 MIL/uL (ref 4.22–5.81)
RDW: 13.4 % (ref 11.5–15.5)
WBC: 5.8 10*3/uL (ref 4.0–10.5)

## 2014-09-09 LAB — LIPID PANEL
CHOL/HDL RATIO: 3.8 ratio
Cholesterol: 146 mg/dL (ref 0–200)
HDL: 38 mg/dL — ABNORMAL LOW (ref >40)
LDL Cholesterol: 96 mg/dL (ref 0–99)
Triglycerides: 59 mg/dL (ref <150)
VLDL: 12 mg/dL (ref 0–40)

## 2014-09-09 LAB — HEPARIN LEVEL (UNFRACTIONATED): Heparin Unfractionated: <0.1 IU/mL — ABNORMAL LOW (ref 0.30–0.70)

## 2014-09-09 SURGERY — RELEASE, A1 PULLEY, FOR TRIGGER FINGER
Anesthesia: Regional | Site: Finger | Laterality: Left

## 2014-09-09 MED ORDER — ATORVASTATIN CALCIUM 80 MG PO TABS: 80.0000 mg | ORAL_TABLET | Freq: Every day | ORAL | Status: AC

## 2014-09-09 MED FILL — Lidocaine HCl Local Preservative Free (PF) Inj 1%: INTRAMUSCULAR | Qty: 30 | Status: AC

## 2014-09-09 MED FILL — Heparin Sodium (Porcine) 2 Unit/ML in Sodium Chloride 0.9%: INTRAMUSCULAR | Qty: 1500 | Status: AC

## 2014-09-09 NOTE — Discharge Summary (Signed)
CARDIOLOGY DISCHARGE SUMMARY   Patient ID: Micheal Gonzalez MRN: [id number] DOB/AGE: 1961/03/03 54 y.o.  Admit date: 09/08/2014 Discharge date: 09/09/2014  PCP: [name], MD Primary Cardiologist: In [place], Dr. Elease Hashimoto  Primary Discharge Diagnosis:  Unstable angina pain Secondary Discharge Diagnosis:  Hypertension  Consults: Pharmacy  Procedures: Cardiac catheterization, coronary arteriogram, left ventriculogram  Hospital Course: Micheal Gonzalez is a 54 y.o. male with no previous history of CAD. He has a history of hypertension and history of CVA in 2014. He was having consistent chest pain/shortness of breath with exertion and was evaluated by Dr. Elease Hashimoto. Stress test was abnormal and he was referred for cath. He came to the hospital for the procedure on 09/08/2014.  Cardiac catheterization results are below. He has significant three-vessel disease and surgical revascularization versus multivessel PCI was being considered. Because of critical stenosis in the circumflex and multivessel disease, he was admitted and started on IV heparin and nitroglycerin.  Family expressed a strong desire that he should be treated at [facility] for this issue. Micheal Gonzalez was kept pain-free on the above medications although he had hypotension secondary to the nitroglycerin. The nitroglycerin will be used on a when necessary basis for chest pain and he is tolerating the heparin without any bleeding issues.  On 09/09/2014, he was seen by Dr. Delton See and all data were reviewed. A bed was obtained at [facility] and arrangements were made for transport. At the time of transport, he is stable from a cardiac standpoint and pain-free. All further inpatient workup will be performed at [facility].  BP 112/70 mmHg  Pulse 50  Temp(Src) 97.7 F (36.5 C) (Oral)  Resp 13  Ht 6' (1.829 m)  Wt 215 lb 2.7 oz (97.6 kg)  BMI 29.18 kg/m2  SpO2 97% General: Well developed, well  nourished, male in no acute distress Head: Eyes PERRLA, No xanthomas.   Normocephalic and atraumatic  Lungs: Clear bilaterally to auscultation. Heart: HRRR S1 S2, without MRG.  Pulses are 2+ & equal. No carotid bruit. No JVD. Abdomen: Bowel sounds are present, abdomen soft and non-tender without masses or  hernias noted. Msk: Normal strength and tone for age. Extremities: No clubbing, cyanosis or edema. Left radial cath site resting in place, no ecchymosis or hematoma   Skin:  No rashes or lesions noted. Neuro: Alert and oriented X 3. Psych:  Good affect, responds appropriately  Labs:   Lab Results  Component Value Date   WBC 5.8 09/09/2014   HGB 14.4 09/09/2014   HCT 42.6 09/09/2014   MCV 93.6 09/09/2014   PLT 144* 09/09/2014     Recent Labs Lab 09/05/14 1125  NA 136  K 3.7  CL 105  CO2 26  BUN 9  CREATININE 1.22  CALCIUM 9.1  GLUCOSE 93    Lipid Panel     Component Value Date/Time   CHOL 146 09/09/2014 0027   TRIG 59 09/09/2014 0027   HDL 38* 09/09/2014 0027   CHOLHDL 3.8 09/09/2014 0027   VLDL 12 09/09/2014 0027   LDLCALC 96 09/09/2014 0027      Radiology: Dg Chest 2 View 08/21/2014   CLINICAL DATA:  Shortness of breath.  EXAM: CHEST  2 VIEW  COMPARISON:  10/09/2012 .  FINDINGS: Mediastinum hilar structures normal. Lungs are clear. Borderline cardiomegaly with normal pulmonary vascularity. No acute bony abnormality .  IMPRESSION: Borderline cardiomegaly with normal pulmonary vascularity. No focal infiltrate .   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 I reviewed the pros and cons of multivessel stenting and coronary bypass surgery  with the patient and his wife. The patient has received sedation I do not think it is appropriate to proceed until he is able to give full informed consent. I would favor an opinion from cardiac surgery. While multivessel stenting is technically feasible in this situation, CABG would certainly be reasonable as well in the setting of three-vessel disease. Will follow-up with him after he is seen by cardiac surgery. He will be admitted and placed on intravenous heparin because of critical stenosis involving the left circumflex and multivessel disease at high risk of abrupt closure. The patient will be admitted to a cardiac stepdown bed.     Coronary Findings    Dominance: Right   Left Anterior Descending   . Prox LAD lesion, 90% stenosed. diffuse .   Marland Kitchen. First Diagonal Branch   The vessel is small in size.   . 1st Diag lesion, 80% stenosed. discrete .     Left Circumflex  The vessel , is large .   Marland Kitchen. Prox Cx lesion, 99% stenosed. discrete .   Marland Kitchen. Third Biomedical engineerbtuse Marginal Branch   . 3rd Mrg lesion, 50% stenosed. discrete .     Right Coronary Artery   . Mid RCA lesion, 80% stenosed.       EKG: 09/08/2014 Sinus rhythm, no acute ischemic changes, inferior T-wave inversions and lateral T-wave flattening Vent. rate 61 BPM PR interval 142 ms QRS duration 86 ms QT/QTc 404/406 ms P-R-T axes 40 -9 -41   FOLLOW UP PLANS AND APPOINTMENTS No Known Allergies   Medication List    TAKE these medications        aspirin 325 MG EC tablet  Take 1 tablet (325 mg total) by mouth daily.     atenolol 25 MG tablet  Commonly known as:  TENORMIN  Take 1 tablet (25 mg total) by mouth daily.     atorvastatin 80 MG tablet  Commonly known as:  LIPITOR  Take 1 tablet (80 mg total) by mouth daily at 6 PM.     cholecalciferol 1000 UNITS tablet  Commonly known as:  VITAMIN D  Take 1,000 Units by mouth daily.     Clindamycin-Benzoyl Per (Refr) gel  Apply 1 application topically 2 (two) times daily.       clonazePAM 1 MG tablet  Commonly known as:  KLONOPIN  Take 1 tablet (1 mg total) by mouth 2 (two) times daily as needed for anxiety.     losartan 50 MG tablet  Commonly known as:  COZAAR  Take 50 mg by mouth daily.     Multiple Vitamins tablet  Take 1 tablet by mouth daily.     nitroGLYCERIN 0.4 MG SL tablet  Commonly known as:  NITROSTAT  Place 1 tablet (0.4 mg total) under the tongue every 5 (five) minutes as needed for chest pain.     rOPINIRole 1 MG tablet  Commonly known as:  REQUIP  Take 1 tablet (1 mg total) by mouth daily.     vitamin B-12 1000 MCG tablet  Commonly known as:  CYANOCOBALAMIN  Take 1,000 mcg by  mouth daily.        Discharge Instructions    Diet - low sodium heart healthy    Complete by:  As directed      Increase activity slowly    Complete by:  As directed           Follow-up Information    Follow up with Nahser, Deloris PingPhilip J, MD.   Specialty:  Cardiology   Why:  As needed   Contact information:   [address] [place] [address] [phone]       BRING ALL MEDICATIONS WITH YOU TO FOLLOW UP APPOINTMENTS  Time spent with patient to include physician time: 36 min Signed: Theodore DemarkBarrett, Rhonda, PA-C 09/09/2014, 10:29 AM Co-Sign MD   The patient was seen, examined and discussed with Theodore Demarkhonda Barrett, PA-C and I agree with the above.   54 year old male with h/o HTN, HLP, CVA with abnormal stress test who underwent cardiac cath yesterday showing significant three-vessel disease and surgical revascularization versus multivessel PCI was being considered. Because of critical stenosis in the circumflex and multivessel disease, he was admitted and started on IV heparin and nitroglycerin. The patient prefers to be [facility] at [facility], he will be transferred today. He is currently asymptomatic. We will continue iv Heparin, aspirin 81 mg po daily, iv NTG, atenolol, atorvastatin and losartan.   Lars MassonELSON, Howell Groesbeck H 09/09/2014

## 2014-09-09 NOTE — Progress Notes (Signed)
Pt complaining of sweating, "not feeling right, sort of like indigestion." Got an EKG and showed to Cardiology resident Dr. Tresa EndoKelly. Per MD EKG looked good. Gave pt maalox for indigestion. Will continue to monitor.

## 2014-09-09 NOTE — Care Management Note (Signed)
Case Management Note  Patient Details  Name: Betha LoaJeffrey A Nunziata MRN: [id number] Date of Birth: 09/10/1960  Subjective/Objective:     Adm w pos procedure               Action/Plan:lives w fam, pcp dr Susann Givenslalonde   Expected Discharge Date:                  Expected Discharge Plan:  Home/Self Care  In-House Referral:     Discharge planning Services     Post Acute Care Choice:    Choice offered to:     DME Arranged:    DME Agency:     HH Arranged:    HH Agency:     Status of Service:     Medicare Important Message Given:    Date Medicare IM Given:    Medicare IM give by:    Date Additional Medicare IM Given:    Additional Medicare Important Message give by:     If discussed at Long Length of Stay Meetings, dates discussed:    Additional Comments: ur ins review, pt wants to transfer to [facility], md will have to get md at [facility] to [facility] and then [facility] would [facility] us [facility] bed then [facility] will transport if has md and bed.  Hanley Haysowell, Giana Castner T, RN 09/09/2014, 9:09 AM

## 2014-09-09 NOTE — Progress Notes (Addendum)
Pt discharged via  [facility] for transport to [facility]. Pt's wife took all belongings. Pt left at 1250. [facility] commissioning( RN ) [facility].

## 2014-09-09 NOTE — Care Management Note (Signed)
5/17 for dc to [facility] today.

## 2014-09-09 NOTE — Consult Note (Signed)
ANTICOAGULATION CONSULT NOTE Pharmacy Consult for Heparin Indication: severe 3v CAD  No Known Allergies  Patient Measurements: Height: 6' (182.9 cm) Weight: 215 lb 2.7 oz (97.6 kg) IBW/kg (Calculated) : 77.6 Heparin Dosing Weight: ~98kg  Vital Signs: Temp: 98.2 F (36.8 C) (05/16 2000) Temp Source: Oral (05/16 2000) BP: 102/51 mmHg (05/17 0040) Pulse Rate: 52 (05/17 0000)  Labs:  Recent Labs  09/09/14 0027  HGB 14.4  HCT 42.6  PLT 144*  HEPARINUNFRC <0.10*    Estimated Creatinine Clearance: 82.8 mL/min (by C-G formula based on Cr of 1.22).  Assessment: 54 yo male with CAD, awaiting CABG, for heparin Goal of Therapy:  Heparin level 0.3-0.7 units/ml Monitor platelets by anticoagulation protocol: Yes   Plan:  Increase Heparin 1700 units/hr Follow-up am labs.  Quintin Hjort, Gary FleetGregory Vernon 09/09/2014,1:19 AM

## 2014-09-12 SURGERY — CORONARY ARTERY BYPASS GRAFTING (CABG)
Anesthesia: General | Site: Chest

## 2014-09-15 ENCOUNTER — Ambulatory Visit (HOSPITAL_BASED_OUTPATIENT_CLINIC_OR_DEPARTMENT_OTHER): Admission: RE | Admit: 2014-09-15 | Payer: 59 | Source: Ambulatory Visit | Admitting: Orthopedic Surgery

## 2014-09-15 HISTORY — PX: CORONARY ARTERY BYPASS GRAFT: SHX141

## 2014-10-23 ENCOUNTER — Telehealth: Payer: Self-pay | Admitting: Internal Medicine

## 2014-10-23 NOTE — Telephone Encounter (Signed)
This has been done.

## 2014-10-23 NOTE — Telephone Encounter (Signed)
Pt is going to see [facility] neuro next Wednesday for an appt and [facility]  Provider- Dr. Lesly Dukesharles Keith Willis NPI- [id number] Dx Code- I69.359      R26.81 Fax # [phone] ID- [id number]

## 2014-10-29 ENCOUNTER — Encounter: Payer: Self-pay | Admitting: Nurse Practitioner

## 2014-10-29 ENCOUNTER — Ambulatory Visit (INDEPENDENT_AMBULATORY_CARE_PROVIDER_SITE_OTHER): Payer: 59 | Admitting: Nurse Practitioner

## 2014-10-29 VITALS — BP 150/86 | HR 61 | Ht 72.0 in | Wt 208.8 lb

## 2014-10-29 DIAGNOSIS — I639 Cerebral infarction, unspecified: Secondary | ICD-10-CM | POA: Diagnosis not present

## 2014-10-29 DIAGNOSIS — G2581 Restless legs syndrome: Secondary | ICD-10-CM | POA: Diagnosis not present

## 2014-10-29 DIAGNOSIS — G8110 Spastic hemiplegia affecting unspecified side: Secondary | ICD-10-CM | POA: Diagnosis not present

## 2014-10-29 NOTE — Progress Notes (Signed)
[facility]  PATIENT: Micheal Gonzalez DOB: 08-Nov-1960   REASON FOR VISIT: Follow-up for history of left brain stroke with right hemiparesis, restless leg, gait disorder,  insomnia HISTORY FROM: Patient    HISTORY OF PRESENT ILLNESS: [name] 54 year old male returns for follow-up. He was last seen by Dr. Anne HahnWillis 04/28/2014 and has history of left brain stroke with right hemiparesis in June 2014. He continues to have minimal use of the right upper extremity. He has a gait disorder but no recent falls. Since last seen he had a CABG 3 at [facility]. The patient claims his only  symptoms were increased shortness of breath and angina on exertion.. He has not had further stroke or TIA symptoms. He is currently on aspirin 0.81 daily. He was taken off of his trazodone due to side effects. He claims his anxiety depression insomnia is actually better since his surgery .He will begin his cardiac rehabilitation next week.He returns for reevaluation   HISTORYMr. Micheal Gonzalez is a 54 year old right-handed white male with a history of a left brain stroke with a right hemiparesis. The patient has minimal use of his right upper extremity, and he has an AFO brace on the right leg with a circumduction gait disorder. He indicates that he has not having problems with falling, he has a circumduction gait with the right leg. He does not use a cane for ambulation. He has had some issues with fatigue, and on his last visit, he was found to have a vitamin B12 deficiency. The indicates that supplementation with vitamin B12 has been helpful. He indicates that his primary care physician has checked a vitamin B12 level recently and it was adequate. He recently was fired from his job secondary due to performance issues. Since this occurrence, he has had a lot of problems with anxiety and depression. He indicates that he is not sleeping well, and he will take lorazepam up to twice a day if needed for anxiety.  Anxiety is oftentimes worse at night. He is also on trazodone taking 50 mg at night. He returns this office for an evaluation.    REVIEW OF SYSTEMS: Full 14 system review of systems performed and notable only for those listed, all others are neg:  Constitutional: neg  Cardiovascular: neg Ear/Nose/Throat: neg  Skin: neg Eyes: neg Respiratory: neg Gastroitestinal: neg  Hematology/Lymphatic: neg  Endocrine: neg Musculoskeletal: Walking difficulty  Allergy/Immunology: neg Neurological: neg Psychiatric: neg Sleep : neg   ALLERGIES: No Known Allergies  HOME MEDICATIONS: Outpatient Prescriptions Prior to Visit  Medication Sig Dispense Refill  . atorvastatin (LIPITOR) 80 MG tablet Take 1 tablet (80 mg total) by mouth daily at 6 PM. 30 tablet 11  . cholecalciferol (VITAMIN D) 1000 UNITS tablet Take 1,000 Units by mouth daily.    . clonazePAM (KLONOPIN) 1 MG tablet Take 1 tablet (1 mg total) by mouth 2 (two) times daily as needed for anxiety. (Patient taking differently: Take 1 mg by mouth at bedtime. ) 90 tablet 3  . Multiple Vitamins tablet Take 1 tablet by mouth daily.    Marland Kitchen. rOPINIRole (REQUIP) 1 MG tablet Take 1 tablet (1 mg total) by mouth daily. 30 tablet 11  . vitamin B-12 (CYANOCOBALAMIN) 1000 MCG tablet Take 1,000 mcg by mouth daily.    Marland Kitchen. aspirin EC 325 MG EC tablet Take 1 tablet (325 mg total) by mouth daily. 30 tablet 0  . atenolol (TENORMIN) 25 MG tablet Take 1 tablet (25 mg total) by mouth daily. 30 tablet  11  . Clindamycin-Benzoyl Per, Refr, gel Apply 1 application topically 2 (two) times daily. (Patient not taking: Reported on 08/20/2014) 45 g 11  . losartan (COZAAR) 50 MG tablet Take 50 mg by mouth daily.    . nitroGLYCERIN (NITROSTAT) 0.4 MG SL tablet Place 1 tablet (0.4 mg total) under the tongue every 5 (five) minutes as needed for chest pain. 50 tablet 3   No facility-administered medications prior to visit.    PAST MEDICAL HISTORY: Past Medical History    Diagnosis Date  . Allergy   . ED (erectile dysfunction)   . RLS (restless legs syndrome)   . Folliculitis barbae   . Insomnia   . Gait disorder   . Hypertension   . History of stroke     Right hemiparesis  . Other vitamin B12 deficiency anemia 07/11/2013  . Exertional angina 09/08/2014    PAST SURGICAL HISTORY: Past Surgical History  Procedure Laterality Date  . Colonoscopy  03/25/11  . Cardiac catheterization N/A 09/08/2014    Procedure: Left Heart Cath and Coronary Angiography;  Surgeon: Tonny Bollman, MD;  Location: [facility] INVASIVE CV LAB;  Service: Cardiovascular;  Laterality: N/A;    FAMILY HISTORY: Family History  Problem Relation Age of Onset  . Cancer Mother 71    Breast cancer  . Heart disease Mother 63    CABG  . Cancer Sister 35    Colon Ca  . Stroke Sister 35  . Cancer Brother     LUNG    SOCIAL HISTORY: History   Social History  . Marital Status: Single    Spouse Name: N/A  . Number of Children: 0  . Years of Education: COLLEGE   Occupational History  .  [employer]    [employer]   Social History Main Topics  . Smoking status: Never Smoker   . Smokeless tobacco: Never Used  . Alcohol Use: 1.0 oz/week    2 Standard drinks or equivalent per week     Comment: 2 drink per week.  . Drug Use: No  . Sexual Activity: Yes   Other Topics Concern  . Not on file   Social History Narrative   Patient is single and lives alone.   Patient is working full-time.   Patient has a Ph.D.   Patient is right-handed.   Patient drinks 1-2 sodas daily.     PHYSICAL EXAM  Filed Vitals:   10/29/14 1331  Height: 6' (1.829 m)  Weight: 208 lb 12.8 oz (94.711 kg)   Body mass index is 28.31 kg/(m^2). General: The patient is alert and cooperative at the time of the examination. Skin: No significant peripheral edema is noted.  Neurologic Exam  Mental status: The patient is oriented x 3. Cranial nerves: Facial symmetry is present. Speech is normal, no aphasia or  dysarthria is noted. Extraocular movements are full. Visual fields are full. Motor: The patient has good strength in the left extremities. The patient holds the right arm in flexion, decreased grip strength is noted on the right, incomplete abduction of the right arm is noted, 4/5 strength in flexion and extension of the elbow noted on the right. The patient has an AFO brace on the right foot, increased motor tone on the right leg. Sensory examination: Soft touch sensation is symmetric on the face, arms, and legs.  Coordination: The patient has good finger-nose-finger and heel-to-shin on the left, has difficulty performing on the right. Gait and station: The patient has a circumduction gait with the  right leg. The patient holds the right arm in flexion with walking. Tandem gait was not attempted. Romberg is negative. Reflexes: Deep tendon reflexes are slightly increased on the right arm and right leg relative to the left.   DIAGNOSTIC DATA (LABS, IMAGING, TESTING) - I reviewed patient records, labs, notes, testing and imaging myself where available.  Lab Results  Component Value Date   WBC 5.8 09/09/2014   HGB 14.4 09/09/2014   HCT 42.6 09/09/2014   MCV 93.6 09/09/2014   PLT 144* 09/09/2014      Component Value Date/Time   NA 136 09/05/2014 1125   NA 140 07/10/2013 0836   K 3.7 09/05/2014 1125   CL 105 09/05/2014 1125   CO2 26 09/05/2014 1125   GLUCOSE 93 09/05/2014 1125   GLUCOSE 89 07/10/2013 0836   BUN 9 09/05/2014 1125   BUN 10 07/10/2013 0836   CREATININE 1.22 09/05/2014 1125   CREATININE 1.32 07/24/2014 0001   CALCIUM 9.1 09/05/2014 1125   PROT 7.0 07/24/2014 0001   PROT 6.8 07/10/2013 0836   ALBUMIN 4.4 07/24/2014 0001   AST 15 07/24/2014 0001   ALT 10 07/24/2014 0001   ALKPHOS 49 07/24/2014 0001   BILITOT 1.1 07/24/2014 0001   GFRNONAA 56* 07/10/2013 0836   GFRAA 64 07/10/2013 0836   Lab Results  Component Value Date   CHOL 146 09/09/2014   HDL 38* 09/09/2014    LDLCALC 96 09/09/2014   TRIG 59 09/09/2014   CHOLHDL 3.8 09/09/2014     ASSESSMENT AND PLAN  54 y.o. year old male  has a past medical history of  RLS (restless legs syndrome); Insomnia; Gait disorder; Hypertension; History of stroke; Other vitamin B12 deficiency anemia (07/11/2013); and Exertional angina (09/08/2014). Recent CABG 3. Here to follow-up for his history of stroke. No further stroke or TIA symptoms. Reviewed hospital admission at [facility] in May.  Continue aspirin for secondary stroke prevention Continue to exercise as able low-fat diet for overall health and well-being Follow-up yearly and when necessary Nilda Riggs, [facility], [facility], APRN  [facility] [address] [place], [place] [address] [phone]

## 2014-10-29 NOTE — Progress Notes (Signed)
I have read the note, and I agree with the clinical assessment and plan.  Bohdan Macho KEITH   

## 2014-10-29 NOTE — Patient Instructions (Addendum)
Continue aspirin for secondary stroke prevention Continue to exercise as able low-fat diet for overall health and well-being Follow-up yearly and when necessary

## 2014-11-04 ENCOUNTER — Encounter: Payer: Self-pay | Admitting: Internal Medicine

## 2014-11-04 DIAGNOSIS — I2510 Atherosclerotic heart disease of native coronary artery without angina pectoris: Secondary | ICD-10-CM

## 2014-12-03 ENCOUNTER — Other Ambulatory Visit: Payer: Self-pay | Admitting: Orthopedic Surgery

## 2014-12-05 ENCOUNTER — Encounter (HOSPITAL_BASED_OUTPATIENT_CLINIC_OR_DEPARTMENT_OTHER): Payer: Self-pay | Admitting: *Deleted

## 2014-12-05 NOTE — Progress Notes (Signed)
Bring all medications. Fax sent to [facility] to get rhythm strip of EKG and Last Labs done 09/2014 after triple bypass.

## 2014-12-09 ENCOUNTER — Encounter (HOSPITAL_BASED_OUTPATIENT_CLINIC_OR_DEPARTMENT_OTHER)
Admission: RE | Disposition: A | Discharge status: Home / Self Care | Payer: Self-pay | Source: Ambulatory Visit | Attending: Orthopedic Surgery

## 2014-12-09 ENCOUNTER — Encounter (HOSPITAL_BASED_OUTPATIENT_CLINIC_OR_DEPARTMENT_OTHER): Payer: Self-pay | Admitting: Orthopedic Surgery

## 2014-12-09 ENCOUNTER — Ambulatory Visit (HOSPITAL_BASED_OUTPATIENT_CLINIC_OR_DEPARTMENT_OTHER): Payer: 59 | Admitting: Anesthesiology

## 2014-12-09 ENCOUNTER — Ambulatory Visit (HOSPITAL_BASED_OUTPATIENT_CLINIC_OR_DEPARTMENT_OTHER)
Admission: RE | Admit: 2014-12-09 | Discharge: 2014-12-09 | Disposition: A | Discharge status: Home / Self Care | Payer: 59 | Source: Ambulatory Visit | Attending: Orthopedic Surgery | Admitting: Orthopedic Surgery

## 2014-12-09 DIAGNOSIS — I10 Essential (primary) hypertension: Secondary | ICD-10-CM | POA: Diagnosis not present

## 2014-12-09 DIAGNOSIS — Z7982 Long term (current) use of aspirin: Secondary | ICD-10-CM | POA: Diagnosis not present

## 2014-12-09 DIAGNOSIS — Z951 Presence of aortocoronary bypass graft: Secondary | ICD-10-CM | POA: Diagnosis not present

## 2014-12-09 DIAGNOSIS — I69351 Hemiplegia and hemiparesis following cerebral infarction affecting right dominant side: Secondary | ICD-10-CM | POA: Insufficient documentation

## 2014-12-09 DIAGNOSIS — M65312 Trigger thumb, left thumb: Secondary | ICD-10-CM | POA: Diagnosis not present

## 2014-12-09 DIAGNOSIS — I2510 Atherosclerotic heart disease of native coronary artery without angina pectoris: Secondary | ICD-10-CM | POA: Insufficient documentation

## 2014-12-09 DIAGNOSIS — I252 Old myocardial infarction: Secondary | ICD-10-CM | POA: Diagnosis not present

## 2014-12-09 DIAGNOSIS — Z79899 Other long term (current) drug therapy: Secondary | ICD-10-CM | POA: Diagnosis not present

## 2014-12-09 DIAGNOSIS — M65322 Trigger finger, left index finger: Secondary | ICD-10-CM | POA: Insufficient documentation

## 2014-12-09 DIAGNOSIS — M65342 Trigger finger, left ring finger: Secondary | ICD-10-CM | POA: Insufficient documentation

## 2014-12-09 DIAGNOSIS — G2581 Restless legs syndrome: Secondary | ICD-10-CM | POA: Insufficient documentation

## 2014-12-09 DIAGNOSIS — M65332 Trigger finger, left middle finger: Secondary | ICD-10-CM | POA: Diagnosis not present

## 2014-12-09 HISTORY — DX: Cerebral infarction, unspecified: I63.9

## 2014-12-09 HISTORY — DX: Unspecified osteoarthritis, unspecified site: M19.90

## 2014-12-09 HISTORY — DX: Atherosclerotic heart disease of native coronary artery without angina pectoris: I25.10

## 2014-12-09 HISTORY — PX: TRIGGER FINGER RELEASE: SHX641

## 2014-12-09 LAB — POCT HEMOGLOBIN-HEMACUE: HEMOGLOBIN: 14.1 g/dL (ref 13.0–17.0)

## 2014-12-09 SURGERY — RELEASE, A1 PULLEY, FOR TRIGGER FINGER
Anesthesia: Monitor Anesthesia Care | Site: Hand | Laterality: Left

## 2014-12-09 MED ORDER — FENTANYL CITRATE (PF) 100 MCG/2ML IJ SOLN: 25.0000 ug | INTRAMUSCULAR | Status: DC | PRN

## 2014-12-09 MED ORDER — HYDROCODONE-ACETAMINOPHEN 5-325 MG PO TABS: 1.0000 | ORAL_TABLET | Freq: Four times a day (QID) | ORAL | Status: DC | PRN

## 2014-12-09 MED ORDER — CEFAZOLIN SODIUM-DEXTROSE 2-3 GM-% IV SOLR
2.0000 g | INTRAVENOUS | Status: AC
  Administered 2014-12-09: 2 g via INTRAVENOUS

## 2014-12-09 MED ORDER — CHLORHEXIDINE GLUCONATE 4 % EX LIQD: 60.0000 mL | Freq: Once | CUTANEOUS | Status: DC

## 2014-12-09 MED ORDER — FENTANYL CITRATE (PF) 100 MCG/2ML IJ SOLN
50.0000 ug | INTRAMUSCULAR | Status: DC | PRN
  Administered 2014-12-09: 50 ug via INTRAVENOUS
  Administered 2014-12-09: 25 ug via INTRAVENOUS

## 2014-12-09 MED ORDER — MIDAZOLAM HCL 2 MG/2ML IJ SOLN
1.0000 mg | INTRAMUSCULAR | Status: DC | PRN
  Administered 2014-12-09: 1 mg via INTRAVENOUS

## 2014-12-09 MED ORDER — PROPOFOL INFUSION 10 MG/ML OPTIME
INTRAVENOUS | Status: DC | PRN
  Administered 2014-12-09: 75 ug/kg/min via INTRAVENOUS

## 2014-12-09 MED ORDER — BUPIVACAINE HCL (PF) 0.25 % IJ SOLN
INTRAMUSCULAR | Status: DC | PRN
  Administered 2014-12-09: 7 mL

## 2014-12-09 MED ORDER — FENTANYL CITRATE (PF) 100 MCG/2ML IJ SOLN
INTRAMUSCULAR | Status: AC
Start: 2014-12-09 — End: 2014-12-09
  Filled 2014-12-09: qty 4

## 2014-12-09 MED ORDER — ONDANSETRON HCL 4 MG/2ML IJ SOLN
INTRAMUSCULAR | Status: DC | PRN
  Administered 2014-12-09: 4 mg via INTRAVENOUS

## 2014-12-09 MED ORDER — PROMETHAZINE HCL 25 MG/ML IJ SOLN: 6.2500 mg | INTRAMUSCULAR | Status: DC | PRN

## 2014-12-09 MED ORDER — LIDOCAINE HCL (CARDIAC) 20 MG/ML IV SOLN
INTRAVENOUS | Status: DC | PRN
  Administered 2014-12-09: 5 mg via INTRAVENOUS
  Administered 2014-12-09: 50 mg via INTRAVENOUS

## 2014-12-09 MED ORDER — LACTATED RINGERS IV SOLN
INTRAVENOUS | Status: DC
  Administered 2014-12-09: 10:00:00 via INTRAVENOUS

## 2014-12-09 MED ORDER — MIDAZOLAM HCL 2 MG/2ML IJ SOLN
INTRAMUSCULAR | Status: AC
  Filled 2014-12-09: qty 2

## 2014-12-09 MED ORDER — GLYCOPYRROLATE 0.2 MG/ML IJ SOLN: 0.2000 mg | Freq: Once | INTRAMUSCULAR | Status: DC | PRN

## 2014-12-09 MED ORDER — SCOPOLAMINE 1 MG/3DAYS TD PT72: 1.0000 | MEDICATED_PATCH | Freq: Once | TRANSDERMAL | Status: DC | PRN

## 2014-12-09 MED ORDER — CEFAZOLIN SODIUM-DEXTROSE 2-3 GM-% IV SOLR
INTRAVENOUS | Status: AC
  Filled 2014-12-09: qty 50

## 2014-12-09 MED ORDER — CEFAZOLIN SODIUM-DEXTROSE 2-3 GM-% IV SOLR: 2.0000 g | INTRAVENOUS | Status: DC

## 2014-12-09 SURGICAL SUPPLY — 31 items
BANDAGE COBAN STERILE 2 (GAUZE/BANDAGES/DRESSINGS) ×3 IMPLANT
BLADE SURG 15 STRL LF DISP TIS (BLADE) ×1 IMPLANT
BLADE SURG 15 STRL SS (BLADE) ×2
BNDG ESMARK 4X9 LF (GAUZE/BANDAGES/DRESSINGS) ×3 IMPLANT
CHLORAPREP W/TINT 26ML (MISCELLANEOUS) ×3 IMPLANT
CORDS BIPOLAR (ELECTRODE) IMPLANT
COVER BACK TABLE 60X90IN (DRAPES) ×3 IMPLANT
COVER MAYO STAND STRL (DRAPES) ×3 IMPLANT
CUFF TOURNIQUET SINGLE 18IN (TOURNIQUET CUFF) ×3 IMPLANT
DECANTER SPIKE VIAL GLASS SM (MISCELLANEOUS) IMPLANT
DRAPE EXTREMITY T 121X128X90 (DRAPE) ×3 IMPLANT
DRAPE SURG 17X23 STRL (DRAPES) ×3 IMPLANT
GAUZE SPONGE 4X4 12PLY STRL (GAUZE/BANDAGES/DRESSINGS) ×3 IMPLANT
GAUZE XEROFORM 1X8 LF (GAUZE/BANDAGES/DRESSINGS) ×3 IMPLANT
GLOVE BIOGEL M 7.0 STRL (GLOVE) ×6 IMPLANT
GLOVE BIOGEL PI IND STRL 8.5 (GLOVE) ×1 IMPLANT
GLOVE BIOGEL PI INDICATOR 8.5 (GLOVE) ×2
GLOVE ECLIPSE 6.5 STRL STRAW (GLOVE) ×3 IMPLANT
GLOVE SURG ORTHO 8.0 STRL STRW (GLOVE) ×3 IMPLANT
GOWN STRL REUS W/ TWL LRG LVL3 (GOWN DISPOSABLE) ×1 IMPLANT
GOWN STRL REUS W/TWL LRG LVL3 (GOWN DISPOSABLE) ×2
GOWN STRL REUS W/TWL XL LVL3 (GOWN DISPOSABLE) ×3 IMPLANT
NEEDLE PRECISIONGLIDE 27X1.5 (NEEDLE) ×3 IMPLANT
NS IRRIG 1000ML POUR BTL (IV SOLUTION) ×3 IMPLANT
PACK BASIN DAY SURGERY FS (CUSTOM PROCEDURE TRAY) ×3 IMPLANT
STOCKINETTE 4X48 STRL (DRAPES) ×3 IMPLANT
SUT ETHILON 4 0 PS 2 18 (SUTURE) ×6 IMPLANT
SYR BULB 3OZ (MISCELLANEOUS) ×3 IMPLANT
SYR CONTROL 10ML LL (SYRINGE) ×3 IMPLANT
TOWEL OR 17X24 6PK STRL BLUE (TOWEL DISPOSABLE) ×3 IMPLANT
UNDERPAD 30X30 (UNDERPADS AND DIAPERS) IMPLANT

## 2014-12-09 NOTE — Anesthesia Postprocedure Evaluation (Signed)
  Anesthesia Post-op Note  Patient: Micheal Gonzalez  Procedure(s) Performed: Procedure(s): RELEASE TRIGGER FINGER/A-1 PULLEY LEFT THUMB, INDEX, MIDDLE, RING FINGERS (Left)  Patient Location: PACU  Anesthesia Type:MAC  Level of Consciousness: awake  Airway and Oxygen Therapy: Patient Spontanous Breathing  Post-op Pain: mild  Post-op Assessment: Post-op Vital signs reviewed              Post-op Vital Signs: Reviewed  Last Vitals:  Filed Vitals:   12/09/14 1200  BP: 131/90  Pulse: 67  Temp:   Resp: 17    Complications: No apparent anesthesia complications

## 2014-12-09 NOTE — Op Note (Signed)
Dictation Number [id number]

## 2014-12-09 NOTE — Brief Op Note (Signed)
12/09/2014  11:39 AM  PATIENT:  Micheal Gonzalez  54 y.o. male  PRE-OPERATIVE DIAGNOSIS:  stenosing tenosynovitis left thumb index middle ring finger  M65.312, M65.322, M65.332, M65.347  POST-OPERATIVE DIAGNOSIS:  stenosing tenosynovitis left thumb index middle ring finger  M65.312, M65.322, M65.332, M65.347  PROCEDURE:  Procedure(s): RELEASE TRIGGER FINGER/A-1 PULLEY LEFT THUMB, INDEX, MIDDLE, RING FINGERS (Left)  SURGEON:  Surgeon(s) and Role:    * Cindee Salt, MD - Primary  PHYSICIAN ASSISTANT:   ASSISTANTS: none   ANESTHESIA:   local and regional  EBL:  Total I/O In: 300 [I.V.:300] Out: -   BLOOD ADMINISTERED:none  DRAINS: none   LOCAL MEDICATIONS USED:  BUPIVICAINE   SPECIMEN:  No Specimen  DISPOSITION OF SPECIMEN:  N/A  COUNTS:  YES  TOURNIQUET:   Total Tourniquet Time Documented: Forearm (Left) - 30 minutes Total: Forearm (Left) - 30 minutes   DICTATION: .Other Dictation: Dictation Number [id number]  PLAN OF CARE: Discharge to home after PACU  PATIENT DISPOSITION:  PACU - hemodynamically stable.

## 2014-12-09 NOTE — Transfer of Care (Signed)
Immediate Anesthesia Transfer of Care Note  Patient: Micheal Gonzalez  Procedure(s) Performed: Procedure(s): RELEASE TRIGGER FINGER/A-1 PULLEY LEFT THUMB, INDEX, MIDDLE, RING FINGERS (Left)  Patient Location: PACU  Anesthesia Type:Bier block  Level of Consciousness: sedated, patient cooperative and lethargic  Airway & Oxygen Therapy: Patient Spontanous Breathing and Patient connected to face mask oxygen  Post-op Assessment: Report given to RN and Post -op Vital signs reviewed and stable  Post vital signs: Reviewed and stable  Last Vitals:  Filed Vitals:   12/09/14 1141  Pulse: 66  Temp:     Complications: No apparent anesthesia complications

## 2014-12-09 NOTE — Anesthesia Preprocedure Evaluation (Signed)
Anesthesia Evaluation  Patient identified by MRN, date of birth, ID band Patient awake    Reviewed: Allergy & Precautions, NPO status   Airway Mallampati: II  TM Distance: >3 FB Neck ROM: Full    Dental   Pulmonary neg pulmonary ROS,  breath sounds clear to auscultation        Cardiovascular hypertension, + angina + CAD and + Past MI Rhythm:Regular Rate:Normal     Neuro/Psych    GI/Hepatic negative GI ROS, Neg liver ROS,   Endo/Other  negative endocrine ROS  Renal/GU negative Renal ROS     Musculoskeletal   Abdominal   Peds  Hematology   Anesthesia Other Findings   Reproductive/Obstetrics                             Anesthesia Physical Anesthesia Plan  ASA: III  Anesthesia Plan: MAC and Bier Block   Post-op Pain Management:    Induction: Intravenous  Airway Management Planned:   Additional Equipment:   Intra-op Plan:   Post-operative Plan:   Informed Consent: I have reviewed the patients History and Physical, chart, labs and discussed the procedure including the risks, benefits and alternatives for the proposed anesthesia with the patient or authorized representative who has indicated his/her understanding and acceptance.   Dental advisory given  Plan Discussed with: CRNA, Anesthesiologist and Surgeon  Anesthesia Plan Comments:         Anesthesia Quick Evaluation

## 2014-12-09 NOTE — H&P (Signed)
[name] is a 54 year-old left-hand dominant male referred by Dr. Susann Givens for consultation with respect to catching of his left thumb, index, middle and ring fingers.  This has been going on for several months.  He has had the thumb, index and middle injected on one occasion.  He is status post stroke affecting his right side, felt to be secondary to elevated blood pressure.  As such, his left side is primarily functional side, his injections were given in [place].  He feels his catching is secondary to increased use of his left side.  He has no history of diabetes, thyroid problems, arthritis or gout.  He complains of constant, extremely severe pain especially in the morning to the point he has to take his opposite hand to straighten his fingers out.  He states it is getting worse.  It awakens him from sleep.  He is not complaining of any numbness or tingling.    ALLERGIES:     None.  MEDICATIONS:     Requip, Klonopin, Losartan, atenolol, vitamins, aspirin.    SURGICAL HISTORY:     He relates no surgery.  FAMILY MEDICAL HISTORY:     Positive for high blood pressure, otherwise negative.   SOCIAL HISTORY:     He does not smoke, drinks socially, he is single, disabled secondary to his stroke.   REVIEW OF SYSTEMS:   Positive for glasses, high blood pressure, stroke, otherwise negative 14 points.   Micheal Gonzalez is an 54 y.o. male.   Chief Complaint: catching left thumb index middle and ring fingers HPI: see above  Past Medical History  Diagnosis Date  . Allergy   . ED (erectile dysfunction)   . RLS (restless legs syndrome)   . Folliculitis barbae   . Insomnia   . Gait disorder   . Hypertension   . History of stroke     Right hemiparesis  . Other vitamin B12 deficiency anemia 07/11/2013  . Exertional angina 09/08/2014  . Coronary artery disease     Triple Bypass done at [facility] Dr. Raynelle Jan  . Stroke     June 2014- partial paralysis on right side  .  Arthritis     right foot    Past Surgical History  Procedure Laterality Date  . Colonoscopy  03/25/11  . Cardiac catheterization N/A 09/08/2014    Procedure: Left Heart Cath and Coronary Angiography;  Surgeon: Tonny Bollman, MD;  [facility]: [facility] INVASIVE CV LAB;  Service: Cardiovascular;  Laterality: N/A;  . Coronary artery bypass graft  09-15-14    Triple    Family History  Problem Relation Age of Onset  . Cancer Mother 23    Breast cancer  . Heart disease Mother 27    CABG  . Cancer Sister 62    Colon Ca  . Stroke Sister 15  . Cancer Brother     LUNG   Social History:  reports that he has never smoked. He has never used smokeless tobacco. He reports that he drinks about 1.0 oz of alcohol per week. He reports that he does not use illicit drugs.  Allergies: No Known Allergies  No prescriptions prior to admission    No results found for this or any previous visit (from the past 48 hour(s)).  No results found.   Pertinent items are noted in HPI.  Height 6' (1.829 m), weight 97.523 kg (215 lb).  General appearance: alert, cooperative and appears stated age Head: Normocephalic, without obvious abnormality  Neck: no JVD Resp: clear to auscultation bilaterally Cardio: regular rate and rhythm, S1, S2 normal, no murmur, click, rub or gallop GI: soft, non-tender; bowel sounds normal; no masses,  no organomegaly Extremities: catching left thumb index maiddle and ring fingers Pulses: 2+ and symmetric Skin: Skin color, texture, turgor normal. No rashes or lesions Neurologic: Grossly normal Incision/Wound: na  Assessment/Plan  DIAGNOSIS:      Stenosing tenosynovitis, left thumb, index, middle and ring fingers.  RECOMMENDATIONS/PLAN:     We have offered him injections.  He declined.  He would like to have these surgically released and would like to have all four fingers done.  He does not live alone and does have help.  He would like to proceed.  We have discussed the  possibility of infection, recurrence, injury to arteries, nerves, tendons, incomplete relief of symptoms and dystrophy.    He is scheduled for release of A-1 pulley of his left thumb, left index, left middle, left ring fingers as an outpatient under regional anesthesia.   This is done at his request.   Questions were encouraged and answered to his satisfaction.   Marijayne Rauth R 12/09/2014, 7:45 AM

## 2014-12-09 NOTE — Op Note (Signed)
Micheal Gonzalez, Micheal Gonzalez NO.:  [id number]  MEDICAL RECORD NO.:  [id number]  LOCATION:                                 FACILITY:  PHYSICIAN:  Cindee Salt, M.D.       DATE OF BIRTH:  [birth year]  DATE OF PROCEDURE:  12/09/2014 DATE OF DISCHARGE:                              OPERATIVE REPORT   PREOPERATIVE DIAGNOSIS:  Stenosing tenosynovitis, left thumb, index, middle, ring fingers.  POSTOPERATIVE DIAGNOSIS:  Stenosing tenosynovitis, left thumb, index, middle, ring fingers.  OPERATION:  Release of A1 pulley, left thumb, index, middle, and ring fingers.  SURGEON:  Cindee Salt, M.D.  ANESTHESIA:  Forearm IV regional with local infiltration.  ANESTHESIOLOGIST:  Judie Petit, M.D.  HISTORY:  The patient is a 54 year old male with a history of triggering of his thumb, index, middle, and ring fingers.  He has elected to undergo surgical release.  Pre, peri, and postoperative course were discussed along with risks and complications.  He is aware that there is no guarantee with the surgery; possibility of infection; recurrence of injury to arteries, nerves, tendons; incomplete relief of symptoms, dystrophy.  In the preoperative area, the patient was seen, the extremity marked by both the patient and surgeon.  Antibiotic given.  DESCRIPTION OF PROCEDURE:  The patient was brought to the operating room where a forearm-based IV regional anesthetic was carried out without difficulty.  He was prepped using ChloraPrep in supine position with the left arm free.  A 3-minute dry time was allowed.  Time-out taken, confirming the patient and procedure.  An oblique incision was made over the ring finger A1 pulley, carried down through subcutaneous tissue. Bleeders were electrocauterized.  The dissection was carried down, retractors placed, protecting neurovascular structures radially and ulnarly.  The A1 pulley was found to be markedly thickened with marked thickening of the  subcutaneous tissue with fibrosis above this.  A1 pulley was then released on its radial aspect.  A small incision was made centrally in A2.  Partial tenosynovectomy performed proximally with separation of the 2 tendons.  Finger placed through a full range motion, no further triggering was noted.  Separate incision was then made on the middle finger, carried down through subcutaneous tissue.  Bleeders again electrocauterized.  Neurovascular structures protected with retractors radially and ulnarly.  Subcutaneous tissue again was very fibrotic, adherent to the flexor sheath.  The flexor sheath was markedly thickened.  This was released on its radial aspect.  A small incision was made centrally in A2.  Partial tenosynovectomy performed proximally with separation of the 2 tendons.  Finger placed through a full range motion.  Again, no further triggering was noted.  The index finger was attended to next.  A separate incision was then made over the metacarpophalangeal area of the index finger A1 pulley.  Retractors placed radially and ulnarly.  Thickening of the subcutaneous tissue with fibrosis was noted again.  The A1 pulley was markedly thickened, requiring incision with the scalpel to be able to incise it and the scissors would not cut it as was performed on the remaining digits.  A small incision made centrally in A2.  A partial tenosynovectomy performed proximally with the  2 tendons separated.  Again, the finger placed through a full range of motion, no further triggering was noted. A transverse incision was then made just proximal to the metacarpal flexion crease of the thumb, left side, carried down through subcutaneous tissue.  Neurovascular structures again protected.  A small incision was made on the radial aspect of the A1 pulley which again was extremely thick on his thumb.  This was incised, taking care to protect the oblique pulley.  The tenosynovial tissue proximally was  separated with blunt dissection.  Thumb placed through a full range of motion, no further triggering was noted.  Each was irrigated and the skin closed with interrupted 4-0 nylon sutures, local infiltration with 0.25% bupivacaine without epinephrine was given to each, a total of 8 mL was used.  A sterile compressive dressing with the fingers free was applied. On deflation of the tourniquet, all fingers immediately pinked.  He was taken to the recovery room for observation in satisfactory condition. He will be discharged home to return to the [facility] in 1 week on [place].          ______________________________ Cindee Salt, M.D.     GK/MEDQ  D:  12/09/2014  T:  12/09/2014  Job:  [id number]

## 2014-12-09 NOTE — Discharge Instructions (Addendum)

## 2014-12-10 ENCOUNTER — Encounter (HOSPITAL_BASED_OUTPATIENT_CLINIC_OR_DEPARTMENT_OTHER): Payer: Self-pay | Admitting: Orthopedic Surgery

## 2014-12-12 ENCOUNTER — Telehealth: Payer: Self-pay | Admitting: Family Medicine

## 2014-12-12 NOTE — Telephone Encounter (Signed)
Do not give these people any info we have not referred our pt there

## 2014-12-12 NOTE — Telephone Encounter (Signed)
I called pt to ask if he knew anything about this he said he did not

## 2014-12-12 NOTE — Telephone Encounter (Addendum)
Mac @ [facility] [phone]) called to get referral info where we referred pt to their facility

## 2015-01-26 ENCOUNTER — Telehealth: Payer: Self-pay | Admitting: Family Medicine

## 2015-01-26 NOTE — Telephone Encounter (Signed)
[name] from the [facility] needs a referral for Micheal Gonzalez for the month of November said he has them for October but the pt dosent have a appt till November the 16th, there number is [phone],

## 2015-01-26 NOTE — Telephone Encounter (Signed)
Pt is no longer our pt he has switched to Dr.Adams in [place]  I have talked with pt he has confirmed this and have called [name] at [phone] to inform her

## 2015-02-19 ENCOUNTER — Other Ambulatory Visit: Payer: Self-pay | Admitting: Family Medicine

## 2015-02-19 NOTE — Telephone Encounter (Signed)
Is this okay to refill? 

## 2015-02-19 NOTE — Telephone Encounter (Signed)
Called out med to pharmacy #[id number] no refills

## 2015-02-19 NOTE — Telephone Encounter (Signed)
Have him set up an appointment 

## 2015-02-19 NOTE — Telephone Encounter (Signed)
Pt states that he is leaving for [place] on Saturday and he will call back and schedule something when he gets back. i will call out a 30 days to his pharmacy

## 2015-05-06 ENCOUNTER — Other Ambulatory Visit: Payer: Self-pay | Admitting: Family Medicine

## 2015-07-11 ENCOUNTER — Other Ambulatory Visit: Payer: Self-pay | Admitting: Family Medicine

## 2015-07-13 NOTE — Telephone Encounter (Signed)
Phoned pt no answer   Needs cpe

## 2016-07-06 IMAGING — NM NM MYOCAR MULTI W/ SPECT
3 series · 18 of 18 positions shown · non-contrast
Comparison: none

[Series 1: rest · 6.51mm/px · 6 of 64 frames shown]
[frame 6/64]
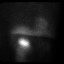
[frame 16/64]
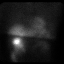
[frame 27/64]
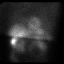
[frame 38/64]
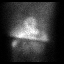
[frame 48/64]
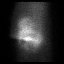
[frame 59/64]
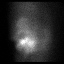

[Series 2: stress · 6.51mm/px · 6 of 64 frames shown (1 of 2)]
[frame 6/64]
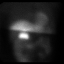
[frame 16/64]
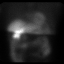
[frame 27/64]
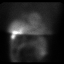
[frame 38/64]
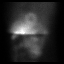
[frame 48/64]
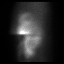
[frame 59/64]
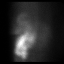

[Series 2: stress · 6.51mm/px · 6 of 512 frames shown (2 of 2)]
[frame 43/512]
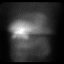
[frame 128/512]
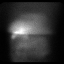
[frame 214/512]
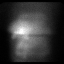
[frame 299/512]
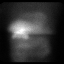
[frame 384/512]
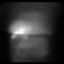
[frame 470/512]
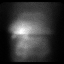

[18 of 18 positions shown; findings below may reference images not displayed]

Canned report from images found in remote index.

Refer to host system for actual result text.

## 2021-07-27 ENCOUNTER — Ambulatory Visit: Payer: Medicare Other | Attending: Orthopaedic Surgery

## 2021-07-27 DIAGNOSIS — M79641 Pain in right hand: Secondary | ICD-10-CM | POA: Insufficient documentation

## 2021-07-27 DIAGNOSIS — M79601 Pain in right arm: Secondary | ICD-10-CM | POA: Insufficient documentation

## 2021-07-27 DIAGNOSIS — M25512 Pain in left shoulder: Secondary | ICD-10-CM | POA: Insufficient documentation

## 2021-07-27 DIAGNOSIS — R278 Other lack of coordination: Secondary | ICD-10-CM | POA: Insufficient documentation

## 2021-07-27 DIAGNOSIS — M25612 Stiffness of left shoulder, not elsewhere classified: Secondary | ICD-10-CM | POA: Diagnosis present

## 2021-07-27 DIAGNOSIS — G8929 Other chronic pain: Secondary | ICD-10-CM | POA: Insufficient documentation

## 2021-07-27 NOTE — Therapy (Signed)
[place] [facility] ?[place], [place], [address] ?Phone: [phone]   Fax:  [phone] ? ?Occupational Therapy Evaluation ? ?Patient Details  ?Name: Micheal Gonzalez ?MRN: [id number] ?Date of Birth: 04-Mar-1962 ?Referring Provider (OT): Dr. Erie Noe ? ? ?Encounter Date: 07/27/2021 ? ? OT End of Session - 07/27/21 1707   ? ? Visit Number 1   ? Number of Visits 16   ? Date for OT Re-Evaluation 09/20/21   ? Authorization Time Period reporting period beginning 07/27/21   ? OT Start Time 1300   ? OT Stop Time 1355   ? OT Time Calculation (min) 55 min   ? Activity Tolerance Patient tolerated treatment well   ? Behavior During Therapy [facility] for tasks assessed/performed   ? ?  ?  ? ?  ? ? ?Past Medical History:  ?Diagnosis Date  ? Allergy   ? Arthritis   ? right foot  ? Coronary artery disease   ? Triple Bypass done at [facility] Dr. Concha Pyo  ? ED (erectile dysfunction)   ? Exertional angina ([place]) 09/08/2014  ? Folliculitis barbae   ? Gait disorder   ? History of stroke   ? Right hemiparesis  ? Hypertension   ? Insomnia   ? Other vitamin B12 deficiency anemia 07/11/2013  ? RLS (restless legs syndrome)   ? Stroke [facility])   ? 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 ? ?Past Surgical History:  ?Procedure Laterality Date  ? CARDIAC CATHETERIZATION N/A 09/08/2014  ? Procedure: Left Heart Cath and Coronary Angiography;  Surgeon: Sherren Mocha, MD;  Location: [place] [facility];  Service: Cardiovascular;  Laterality: N/A;  ? COLONOSCOPY  03/25/11  ? CORONARY ARTERY BYPASS GRAFT  09-15-14  ? Triple  ? TRIGGER FINGER RELEASE Left 12/09/2014  ? Procedure: RELEASE TRIGGER FINGER/A-1 PULLEY LEFT THUMB, INDEX, MIDDLE, RING FINGERS;  Surgeon: Daryll Brod, MD;  Location: [place];  Service: Orthopedics;  Laterality: Left;  ? ? ?There were no vitals filed for this visit. ? ? Subjective Assessment - 07/27/21 1635   ? ? Subjective  "This gives me some  hope." (pt hopeful with OT poc to maximize indep post sx)   ? Pertinent History L CVA with R sided hemiplegia in 2014, L RCT with failed conservative approach, planning sx for rotator cuff repair on 08/13/21   ? Limitations L shoulder pain and weakness, R sided hemiplegia post CVA   ? Patient Stated Goals "I want to learn what I can do to take care of myself and be as indep as I can after my surgery."   ? Currently in Pain? Yes   ? Pain Score 6    ? Pain Location Shoulder   ? Pain Orientation Left   ? Pain Descriptors / Indicators Aching;Sore   ? Pain Type Chronic pain   ? Pain Onset More than a month ago   ? Pain Frequency Intermittent   ? Aggravating Factors  reaching with LUE, lifting, carrying   ? Pain Relieving Factors rest   ? Effect of Pain on Daily Activities pain in L shoulder with daily tasks   ? Multiple Pain Sites Yes   ? Pain Score 8   ? Pain Location Arm   ? Pain Orientation Right   ? Pain Descriptors / Indicators Aching;Tightness   ? Pain Type Chronic pain   ? Pain Radiating Towards wrist and hand   ?  Pain Onset More than a month ago   ? Pain Frequency Intermittent   ? Aggravating Factors  spasticity in RUE   ? Pain Relieving Factors rest, gentle stretch, splint use   ? Effect of Pain on Daily Activities discomfort in RUE with daily tasks   ? ?  ?  ? ?  ? ? ? ? [place] OT Assessment - 07/27/21 1312   ? ?  ? Assessment  ? Medical Diagnosis Joint derangement, unspecified   upcoming rotator cuff repair to the L shoulder, hx of R sided hemiplegia on the L  ? Referring Provider (OT) Dr. Erie Noe   ? Onset Date/Surgical Date 07/26/21   L rotator cuff repair scheduled for 08/13/21  ? Hand Dominance Right   predominantly uses L hand since CVA, uses R only as a stabilizer  ? Next MD Visit sx scheduled on 08/13/21   ? Prior Therapy PT at [facility] for L shoulder, but stopped 2 weeks ago.  Had OT before post CVA in 2016, but none for current issue   ?  ? Balance Screen  ? Has the patient fallen in the past 6 months  No   ?  ? Home  Environment  ? Family/patient expects to be discharged to: Private residence   ? Living Arrangements Spouse/significant other   ? Available Help at Discharge Family   ? Type of Home House   ? Home Access Stairs   ? Home Layout One level   ? Bathroom Building control surveyor   ? Bathroom Toilet Handicapped height   ? Home Equipment Electric scooter;Cane - single point   ?  ? Prior Function  ? Level of Independence Requires assistive device for independence   ? Vocation Retired   ? Leisure reading, traveling, active with church   ?  ? ADL  ? Transfers/Ambulation Related to ADL's Pt uses SPC in L hand   ? ADL comments Pt currently performs all basic self care with modified indep, primarily using LUE; RUE can be used as a stabilizer.  Pt will need training in AE/adapted strategies for basic self care post sx as his LUE will be immobilized in a sling and pt has limited use of RUE d/t hemiplegia.   ?  ? IADL  ? Meal Prep Plans, prepares and serves adequate meals independently   ? Medication Management Is responsible for taking medication in correct dosages at correct time   ?  ? Cognition  ? Overall Cognitive Status Within Functional Limits for tasks assessed   ?  ? Posture/Postural Control  ? Posture Comments R sided hemiplegia   ?  ? Sensation  ? Light Touch Appears Intact   ?  ? Coordination  ? Gross Motor Movements are Fluid and Coordinated No   RUE hemiplegia  ? Fine Motor Movements are Fluid and Coordinated No   RUE hemiplegia  ?  ? AROM  ? Overall AROM Comments Pt has functional ROM to L shoulder, but with pain, and will be immobilized post sx; R shoulder active flex/abd combo limited to 85 degrees, abd to 55 degrees.  Limitations with R ER (R hand to R cheek, unable to reach behind head), unable to reach behind back (R hand reaching only to R hip), pt can reach R hand to L axilla with difficulty; unable to reach R hand to feet (will benefit from long handled AE for basic self care)   ?  ? Hand  Function  ? Right Hand Grip (  lbs) 11   ? Right Hand Lateral Pinch 5 lbs   ? Right Hand 3 Point Pinch 3 lbs   ? Left Hand Grip (lbs) 81   ? Left Hand Lateral Pinch 21 lbs   ? Left 3 point pinch 22 lbs   ?  ? RUE Tone  ? Hypertonic Details moderate spasticity in R wrist and hand with pain at 6-8/10   ? ?  ?  ? ?  ? ?Occupational Therapy Evaluation: ?Pt is a 61 y/o male referred to OT for ADL/AE training related to upcoming L rotator cuff sx on 08/13/21.  Pt has hx of R sided hemiplegia from CVA in 2016 with limited use of this side.  LUE will be immobilized in sling post sx, so pt is eager to learn strategies to maintain maximum indep with BADLs.  Pt resides with spouse who will be able to manage IADLs, but pt has concerns specifically with bathing/dressing/toileting.  Pt also has significant pain in distal RUE from hypertonicity.  He has a Softpro functional resting hand splint which he has positioned with a large gap at wrist, indicating poor fit.  OT adjusted at eval but pt would benefit from additional passive stretching and review of WB activities and self ROM in order to reduce pain in R wrist and hand and improve positioning and comfort of R hand in splint.  Pt in agreement with OT poc.   ? ?Self Care: ?Educated pt on AE for BADLs with recommendation for pt to obtain "Bottom Buddy" for toileting.  Pt has limited grip and ROM in RUE, but may be able to perform hygiene from the front using this tool with wet wipes, and for more thorough cleaning may benefit from bidet attachment.  Also recommended hip kit, including dressing stick and long handled sponge, as well as slip on shoes.  OT advised on options to obtain, and also encouraged pt that he could wait to order items after practicing with a number of tools in the clinic during therapy sessions.  Pt receptive to all recommendations and eager to practice with AE.  Pt was already issued abductor sling for L arm.  OT encouraged pt to bring sling back to therapy  sessions to practice ADLs with LUE immobilized.  Also encouraged pt bring back his resting hand splint for additional adjustment/fit.  Pt in agreement with plan.  ? ? ? OT Education - 07/27/21 1705   ? ? Edu

## 2021-08-03 ENCOUNTER — Ambulatory Visit: Payer: Medicare Other

## 2021-08-03 DIAGNOSIS — R278 Other lack of coordination: Secondary | ICD-10-CM

## 2021-08-03 DIAGNOSIS — M79601 Pain in right arm: Secondary | ICD-10-CM

## 2021-08-03 DIAGNOSIS — M79641 Pain in right hand: Secondary | ICD-10-CM

## 2021-08-03 DIAGNOSIS — G8929 Other chronic pain: Secondary | ICD-10-CM

## 2021-08-03 NOTE — Therapy (Signed)
[place] ?[facility] ?[address] ?[place], [place], [address] ?Phone: [phone]   Fax:  [phone] ? ?Patient Details  ?Name: Micheal Gonzalez ?MRN: [id number] ?Date of Birth: December 30, 1960 ?Referring Provider:  Lynetta Mare, MD ? ?Encounter Date: 08/03/2021 ? ? ?PT/OT/SLP Screening Form ? ? ?Time: in : 15:15   ? ?Time out 15: 30  ? ?Complaint RUE pain  ?Past Medical Hx:  CVA ?Injury Date:_2014 ?Pain Scale: 8/10  ?Patient?s phone number:  ? ?Hx (this occurrence):  ? ?Stroke in 2014, has 8/10 pain that has increased in the past few years on RUE.  ? ? ? ?Assessment: ? Multiple trigger points in bicep, tricep, wrist flexors, extensors, PAD musculature. ? ?Trigger Point Dry Needling (TDN), unbilled ?Education performed with patient regarding potential benefit of TDN. Reviewed precautions and risks with patient. Reviewed special precautions/risks over lung fields which include pneumothorax. Reviewed signs and symptoms of pneumothorax and advised pt to go to ER immediately if these symptoms develop advise them of dry needling treatment. Extensive time spent with pt to ensure full understanding of TDN risks. Pt provided verbal consent to treatment. TDN performed to  with 0.3 x 30 single needle placements with local twitch response (LTR). Pistoning technique utilized. Improved pain-free motion following intervention. Musculature targeted: R upper bicep, R tricep, R wrist flexors/extensors, and R abductor pollicis longus/brevis, DAB musculature  ?  ? ? ? ?Recommendations:   ? ?Comments: ?Patient presents with increased tone and trigger points of RUE. He has significant trigger points released with TDN in bicep and thenar musculature. Patient will benefit from referral to PT for TDN.  ? ? ?[]  Patient would benefit from an MD referral ?[x]  Patient would benefit from a full PT/OT/ SLP evaluation and treatment. ?[]  No intervention recommended at this time. ? ? ? ,  PT, DPT ? ?08/03/2021, 3:38 PM ? ?[place] ?[facility] ?[address] ?[place], 10/03/2021, [facility] ?Phone: [phone]   Fax:  [phone] ?

## 2021-08-04 NOTE — Therapy (Signed)
[place] ?[facility] ?[place], [place], [address] ?Phone: [phone]   Fax:  [phone] ? ?Occupational Therapy Treatment ? ?Patient Details  ?Name: Micheal Gonzalez ?MRN: [id number] ?Date of Birth: [birth year] ?Referring Provider (OT): Dr. Erie Noe ? ? ?Encounter Date: 08/03/2021 ? ? OT End of Session - 08/04/21 1417   ? ? Visit Number 2   ? Number of Visits 16   ? Date for OT Re-Evaluation 09/20/21   ? Authorization Time Period reporting period beginning 07/27/21   ? OT Start Time 1400   ? OT Stop Time 1502   ? OT Time Calculation (min) 62 min   ? Activity Tolerance Patient tolerated treatment well   ? Behavior During Therapy [facility] for tasks assessed/performed   ? ?  ?  ? ?  ? ? ?Past Medical History:  ?Diagnosis Date  ? Allergy   ? Arthritis   ? right foot  ? Coronary artery disease   ? Triple Bypass done at [facility] Dr. Concha Pyo  ? ED (erectile dysfunction)   ? Exertional angina ([place]) 09/08/2014  ? Folliculitis barbae   ? Gait disorder   ? History of stroke   ? Right hemiparesis  ? Hypertension   ? Insomnia   ? Other vitamin B12 deficiency anemia 07/11/2013  ? RLS (restless legs syndrome)   ? Stroke [facility])   ? 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 ? ?Past Surgical History:  ?Procedure Laterality Date  ? CARDIAC CATHETERIZATION N/A 09/08/2014  ? Procedure: Left Heart Cath and Coronary Angiography;  Surgeon: Sherren Mocha, MD;  Location: [place] [facility];  Service: Cardiovascular;  Laterality: N/A;  ? COLONOSCOPY  03/25/11  ? CORONARY ARTERY BYPASS GRAFT  09-15-14  ? Triple  ? TRIGGER FINGER RELEASE Left 12/09/2014  ? Procedure: RELEASE TRIGGER FINGER/A-1 PULLEY LEFT THUMB, INDEX, MIDDLE, RING FINGERS;  Surgeon: Daryll Brod, MD;  Location: [place];  Service: Orthopedics;  Laterality: Left;  ? ? ?There were no vitals filed for this visit. ? ? Subjective Assessment - 08/03/21 1412   ? ? Subjective  Pt brought in his AE  today that he recently ordered for training with OT.   ? Pertinent History L CVA with R sided hemiplegia in 2014, L RCT with failed conservative approach, planning sx for rotator cuff repair on 08/13/21   ? Limitations L shoulder pain and weakness, R sided hemiplegia post CVA   ? Patient Stated Goals "I want to learn what I can do to take care of myself and be as indep as I can after my surgery."   ? Currently in Pain? Yes   ? Pain Score 6    ? Pain Location Shoulder   ? Pain Orientation Left   ? Pain Descriptors / Indicators Aching;Sore   ? Pain Type Chronic pain   ? Pain Onset More than a month ago   ? Pain Frequency Intermittent   ? Aggravating Factors  reaching with LUE, lifting, carrying   ? Pain Relieving Factors rest   ? Effect of Pain on Daily Activities pain in L shoulder with daily tasks   ? Multiple Pain Sites Yes   ? Pain Score 8   ? Pain Location Arm   ? Pain Orientation Right   ? Pain Descriptors / Indicators Aching;Tightness   ? Pain Type Chronic pain   ? Pain Radiating Towards bicep, wrist, and hand   ?  Pain Onset More than a month ago   ? Pain Frequency Constant   ? Aggravating Factors  spasticity in RUE   ? Pain Relieving Factors rest, gentle stretch, splint use   ? Effect of Pain on Daily Activities discomfort in RUE with daily tasks   ? ?  ?  ? ?  ? ?Occupational Therapy Treatment: ?Therapeutic Exercise: ?Participated in passive stretching, slow and prolonged, for decreasing flexor hypertonicity and pain throughout R elbow, wrist, and hand.  OT instructed in self passive stretching techniques with good return demo and encouraged pt to utilize table top as able to achieve digit, wrist, and elbow extension as pt will be unable to use LUE for stretching R side following surgery.  Collaborated with PT and planning for PT screen this date to assess appropriateness for dry needling to RUE.   ? ?Self Care: ?Pt obtained hip kit, including sockaid , dressing stick, long handled sponge and loofa, reacher,  long shoe horn.  Pt also obtained toilet wand.  Pt practiced donning/doffing socks, though socks were mid calf and tight which made use of dressing stick more challenging.  Pt was able to push 1 sock off using long shoe horn with set up horn behind heel and foot placed on footstool for closer positioning.  OT instructed in use of sockaid, but pt will need a looser sock.  OT made recommendation for ankle socks for easier donning/doffing.  Practiced with use of shoe horn, requiring max A despite using step stool.  Pt reports he will likely have spouse assist with shoes.  Simulated UB bathing with use of long handled loofa, alternating between R/L hands but reinforcing need to keep L arm at side.  Instructed pt in leaning forward with L hand dangling to wash under L axilla, noting pt will be unable to actively abduct arm for bathing.  OT demonstrated set up of toilet paper on toilet wand, with good return demo from pt.  OT encouraged pt start to practice with AE at home to ease use after surgery.  Pt receptive to all recommendations and strategies and plans to practice strategies at home.  ? ?Response to Treatment: ?Pt was able to obtain hip kit, long handled loofa, and toilet wand and brought to therapy this day for instruction in use of devices.  OT encouraged pt start to practice with AE at home to ease use after surgery.  Additional training needed with OT for AE with a variety of clothing types; pt's socks were too tight for efficient use of dressing stick and shoe horn and plans to bring in ankle socks next session.  Will need to practice donning strategies with tshirt as well.  Pt receptive to all recommendations and strategies and plans to practice strategies at home.  Collaborated with PT and planning for PT screen this date to assess appropriateness for dry needling to RUE.  Pt planning to obtain new soft pro splint for RUE.  Pt will continue to benefit from skilled OT addressing RUE pain, splint fitting, and  AE instruction for ADLs.   ? ? OT Education - 08/03/21 1415   ? ? Education Details Recommendation to obtain new soft pro splint d/t metal poking through foam padding and metal is overly bent; pt reports he's tried to reshape it with pliers unsuccessfully.   ? Person(s) Educated Patient   ? Methods Explanation;Verbal cues   ? Comprehension Verbalized understanding   ? ?  ?  ? ?  ? ? ?  OT Short Term Goals - 07/27/21 1713   ? ?  ? OT SHORT TERM GOAL #1  ? Title Pt will be indep with WB activities and passive stretching in order to manage pain and hypertonicity in distal RUE.   ? Baseline Eval: not yet initiated   ? Time 4   ? Period Weeks   ? Status New   ? Target Date 08/24/21   ?  ? OT SHORT TERM GOAL #2  ? Title Pt will be indep to adjust R resting hand splint appropriately, making changes as needed based on hypertonicity.   ? Baseline Eval: poor fit of splint; RUE pain at 6-8/10.  Pt reports R hand feels better in splint but OT noting large gab at volar wrist indicating poor fit. OT adjusted at eval and positioned into slight slight wrist flexion; will work towards neutral or 30 degrees ext.   ? Time 4   ? Period Weeks   ? Status New   ? Target Date 08/24/21   ?  ? OT SHORT TERM GOAL #3  ? Title Pt will perform UB ADLs with modified indep using AE and/or adapted strategies.   ? Baseline Eval: pt will be immobilized in L shoulder sling post sx and is limited with RUE d/t hemiplegia.   ? Time 4   ? Period Weeks   ? Status New   ? Target Date 08/24/21   ?  ? OT SHORT TERM GOAL #4  ? Title Pt will perform LB ADLs with min A using AE and/or adapted strategies.   ? Baseline Eval: pt will be immobilized in L shoulder sling post sx and is limited with RUE d/t hemiplegia.   ? Time 4   ? Period Weeks   ? Status New   ? Target Date 08/24/21   ? ?  ?  ? ?  ? ? ? ? OT Long Term Goals - 07/27/21 1724   ? ?  ? OT LONG TERM GOAL #1  ? Title Pt will report pain at 4 or less throughout RUE as a result of consistent ROM and WB to  manage RUE hypertonicity.   ? Baseline Eval: pt reports 6-8/10 pain in RUE d/t hypertonicity and likely poor fit of splint   ? Time 8   ? Period Weeks   ? Status New   ? Target Date 09/20/21   ?  ? OT L

## 2021-08-05 ENCOUNTER — Ambulatory Visit: Payer: Medicare Other

## 2021-08-05 DIAGNOSIS — R278 Other lack of coordination: Secondary | ICD-10-CM | POA: Diagnosis not present

## 2021-08-05 DIAGNOSIS — G8929 Other chronic pain: Secondary | ICD-10-CM

## 2021-08-05 DIAGNOSIS — M79601 Pain in right arm: Secondary | ICD-10-CM

## 2021-08-06 NOTE — Therapy (Signed)
[place] ?[facility] ?[address] ?[place], [place], [address] ?Phone: [phone]   Fax:  [phone] ? ?Occupational Therapy Treatment ? ?Patient Details  ?Name: Micheal Gonzalez ?MRN: [id number] ?Date of Birth: Jan 12, 1961 ?Referring Provider (OT): Dr. Reed Breech ? ? ?Encounter Date: 08/05/2021 ? ? OT End of Session - 08/06/21 1900   ? ? Visit Number 3   ? Number of Visits 16   ? Date for OT Re-Evaluation 09/20/21   ? Authorization Time Period reporting period beginning 07/27/21   ? OT Start Time 1515   ? OT Stop Time 1600   ? OT Time Calculation (min) 45 min   ? Activity Tolerance Patient tolerated treatment well   ? Behavior During Therapy [facility] for tasks assessed/performed   ? ?  ?  ? ?  ? ? ?Past Medical History:  ?Diagnosis Date  ? Allergy   ? Arthritis   ? right foot  ? Coronary artery disease   ? Triple Bypass done at [facility] Dr. Raynelle Jan  ? ED (erectile dysfunction)   ? Exertional angina (HCC) 09/08/2014  ? Folliculitis barbae   ? Gait disorder   ? History of stroke   ? Right hemiparesis  ? Hypertension   ? Insomnia   ? Other vitamin B12 deficiency anemia 07/11/2013  ? RLS (restless legs syndrome)   ? Stroke [facility])   ? 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 ? ?Past Surgical History:  ?Procedure Laterality Date  ? CARDIAC CATHETERIZATION N/A 09/08/2014  ? Procedure: Left Heart Cath and Coronary Angiography;  Surgeon: Tonny Bollman, MD;  Location: [facility] INVASIVE CV LAB;  Service: Cardiovascular;  Laterality: N/A;  ? COLONOSCOPY  03/25/11  ? CORONARY ARTERY BYPASS GRAFT  09-15-14  ? Triple  ? TRIGGER FINGER RELEASE Left 12/09/2014  ? Procedure: RELEASE TRIGGER FINGER/A-1 PULLEY LEFT THUMB, INDEX, MIDDLE, RING FINGERS;  Surgeon: Cindee Salt, MD;  Location: [facility];  Service: Orthopedics;  Laterality: Left;  ? ? ?There were no vitals filed for this visit. ? ? Subjective Assessment - 08/05/21 1856   ? ? Subjective  "My goal is to do  what I can but with as little stress as possible."   ? Pertinent History L CVA with R sided hemiplegia in 2014, L RCT with failed conservative approach, planning sx for rotator cuff repair on 08/13/21   ? Limitations L shoulder pain and weakness, R sided hemiplegia post CVA   ? Patient Stated Goals "I want to learn what I can do to take care of myself and be as indep as I can after my surgery."   ? Currently in Pain? Yes   ? Pain Score 6    ? Pain Location Shoulder   ? Pain Orientation Left   ? Pain Descriptors / Indicators Aching;Sore   ? Pain Type Chronic pain   ? Pain Onset More than a month ago   ? Pain Frequency Intermittent   ? Aggravating Factors  reaching with LUE, lifting, carrying   ? Pain Relieving Factors rest   ? Effect of Pain on Daily Activities pain in L shoulder with daily tasks   ? Multiple Pain Sites Yes   ? Pain Score 8   ? Pain Location Arm   ? Pain Orientation Right   ? Pain Descriptors / Indicators Aching;Tightness   ? Pain Type Chronic pain   ? Pain Radiating Towards bicep, wrist, and hand   ?  Pain Onset More than a month ago   ? Pain Frequency Constant   ? Aggravating Factors  spasticity in RUE   ? Pain Relieving Factors rest, gentle stretch, splint use   ? Effect of Pain on Daily Activities discomfort in RUE with daily tasks   ? ?  ?  ? ?  ? ?Occupational Therapy Treatment: ?Therapeutic Exercise: ?Participated in passive stretching, slow and prolonged, for decreasing flexor hypertonicity and pain throughout R elbow, wrist, and hand.  OT reinforced importance of consistent self passive stretching for better management of pain throughout RUE.   ? ?Self Care: ?Pt reports he has been practicing with his toilet wand but struggles with maintaining his grip in his R hand.  OT demonstrated universal cuff and securing toilet wand in palm with universal cuff.  Pt applied cuff and felt it worked well and plans to order cuff.  OT advised cuff could also be used with his other devices, likely would  benefit from use with long handled sponge.  Pt receptive.  Worked with sockaid and ankle socks.  Issued dycem square for easier doffing of ankle socks and also used dressing stick.  Donned sling to practice with AE while LUE was immobilized.  Instructed in use of dressing stick for donning/doffing tshirt and loose button up shirt.     ? ?Response to Treatment: ?See Plan/clinical impression below. ? ? ? OT Education - 08/05/21 1859   ? ? Education Details AE use, universal cuff   ? Person(s) Educated Patient   ? Methods Explanation;Verbal cues;Demonstration;Tactile cues   ? Comprehension Verbalized understanding;Returned demonstration;Need further instruction   ? ?  ?  ? ?  ? ? ? OT Short Term Goals - 07/27/21 1713   ? ?  ? OT SHORT TERM GOAL #1  ? Title Pt will be indep with WB activities and passive stretching in order to manage pain and hypertonicity in distal RUE.   ? Baseline Eval: not yet initiated   ? Time 4   ? Period Weeks   ? Status New   ? Target Date 08/24/21   ?  ? OT SHORT TERM GOAL #2  ? Title Pt will be indep to adjust R resting hand splint appropriately, making changes as needed based on hypertonicity.   ? Baseline Eval: poor fit of splint; RUE pain at 6-8/10.  Pt reports R hand feels better in splint but OT noting large gab at volar wrist indicating poor fit. OT adjusted at eval and positioned into slight slight wrist flexion; will work towards neutral or 30 degrees ext.   ? Time 4   ? Period Weeks   ? Status New   ? Target Date 08/24/21   ?  ? OT SHORT TERM GOAL #3  ? Title Pt will perform UB ADLs with modified indep using AE and/or adapted strategies.   ? Baseline Eval: pt will be immobilized in L shoulder sling post sx and is limited with RUE d/t hemiplegia.   ? Time 4   ? Period Weeks   ? Status New   ? Target Date 08/24/21   ?  ? OT SHORT TERM GOAL #4  ? Title Pt will perform LB ADLs with min A using AE and/or adapted strategies.   ? Baseline Eval: pt will be immobilized in L shoulder sling  post sx and is limited with RUE d/t hemiplegia.   ? Time 4   ? Period Weeks   ? Status New   ?  Target Date 08/24/21   ? ?  ?  ? ?  ? ? ? ? OT Long Term Goals - 07/27/21 1724   ? ?  ? OT LONG TERM GOAL #1  ? Title Pt will report pain at 4 or less throughout RUE as a result of consistent ROM and WB to manage RUE hypertonicity.   ? Baseline Eval: pt reports 6-8/10 pain in RUE d/t hypertonicity and likely poor fit of splint   ? Time 8   ? Period Weeks   ? Status New   ? Target Date 09/20/21   ?  ? OT LONG TERM GOAL #2  ? Title Pt will be indep to maintain orthopedic precautions with daily activities post L shoulder rotator cuff repair.   ? Baseline Eval: sx scheduled for 08/13/21   ? Time 8   ? Period Weeks   ? Status New   ? Target Date 09/20/21   ? ?  ?  ? ?  ? ? ? Plan - 08/05/21 1928   ? ? Clinical Impression Statement Pt reported that he has ordered his new soft pro splint but it has not yet arrived.  After working with AE, pt anticipates that he will likely plan to have spouse assist with donning/doffing shirts as he struggles to limit movement in LUE, feels dressing stick with RUE may be too frustrating.  OT recommended loose fitting tshirts or loose button up shirts for spouse to assist pt with.  Pt also anticipates spouse will help with LB dressing, specifically donning socks and shoes as pt struggled to limit excessive movement in LUE when using equipment for LB dressing.  Pt was able to doff his ankle socks with use of dycem and dressing stick.  Pt plans to use universal cuff for gripping toilet wand and long handled sponge.  Planning at least 1 additional visit post surgery to further review ADL strategies and assist with proper fit of new soft pro splint which should arrive next week.  Pt in agreement with plan.   ? OT Occupational Profile and History Problem Focused Assessment - Including review of records relating to presenting problem   ? Occupational performance deficits (Please refer to evaluation for  details): ADL's;IADL's   ? Body Structure / Function / Physical Skills ADL;UE functional use;Coordination;Flexibility;IADL;Pain;Tone;Strength;ROM   ? Rehab Potential Good   ? Clinical Decision Making Several trea

## 2021-08-09 ENCOUNTER — Encounter: Payer: Self-pay | Admitting: Occupational Therapy

## 2021-08-09 ENCOUNTER — Ambulatory Visit: Payer: Medicare Other

## 2021-08-10 ENCOUNTER — Ambulatory Visit: Payer: Medicare Other

## 2021-08-11 ENCOUNTER — Encounter: Payer: Self-pay | Admitting: Occupational Therapy

## 2021-08-12 ENCOUNTER — Ambulatory Visit: Payer: Medicare Other

## 2021-08-16 ENCOUNTER — Ambulatory Visit: Payer: Medicare Other

## 2021-08-16 ENCOUNTER — Encounter: Payer: Self-pay | Admitting: Occupational Therapy

## 2021-08-16 DIAGNOSIS — M25512 Pain in left shoulder: Secondary | ICD-10-CM

## 2021-08-16 DIAGNOSIS — M25612 Stiffness of left shoulder, not elsewhere classified: Secondary | ICD-10-CM

## 2021-08-16 DIAGNOSIS — R278 Other lack of coordination: Secondary | ICD-10-CM | POA: Diagnosis not present

## 2021-08-16 NOTE — Therapy (Signed)
[place] ?[facility] ?[address] ?[place], [place], [address] ?Phone: [phone]   Fax:  [phone] ? ?Physical Therapy Evaluation ? ?Patient Details  ?Name: Micheal Gonzalez ?MRN: [id number] ?Date of Birth: 1960-08-17 ?No data recorded ? ?Encounter Date: 08/16/2021 ? ? PT End of Session - 08/16/21 0927   ? ? Visit Number 1   ? Number of Visits 24   ? Date for PT Re-Evaluation 11/08/21   ? Authorization Type 1/10 eval 08/16/21   ? PT Start Time 0800   ? PT Stop Time 0857   ? PT Time Calculation (min) 57 min   ? Activity Tolerance Patient tolerated treatment well   ? Behavior During Therapy [facility] for tasks assessed/performed   ? ?  ?  ? ?  ? ? ?Past Medical History:  ?Diagnosis Date  ? Allergy   ? Arthritis   ? right foot  ? Coronary artery disease   ? Triple Bypass done at [facility] Dr. Raynelle Jan  ? ED (erectile dysfunction)   ? Exertional angina (HCC) 09/08/2014  ? Folliculitis barbae   ? Gait disorder   ? History of stroke   ? Right hemiparesis  ? Hypertension   ? Insomnia   ? Other vitamin B12 deficiency anemia 07/11/2013  ? RLS (restless legs syndrome)   ? Stroke [facility])   ? 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 ? ?Past Surgical History:  ?Procedure Laterality Date  ? CARDIAC CATHETERIZATION N/A 09/08/2014  ? Procedure: Left Heart Cath and Coronary Angiography;  Surgeon: Tonny Bollman, MD;  Location: [facility] INVASIVE CV LAB;  Service: Cardiovascular;  Laterality: N/A;  ? COLONOSCOPY  03/25/11  ? CORONARY ARTERY BYPASS GRAFT  09-15-14  ? Triple  ? TRIGGER FINGER RELEASE Left 12/09/2014  ? Procedure: RELEASE TRIGGER FINGER/A-1 PULLEY LEFT THUMB, INDEX, MIDDLE, RING FINGERS;  Surgeon: Cindee Salt, MD;  Location: [facility];  Service: Orthopedics;  Laterality: Left;  ? ? ?There were no vitals filed for this visit. ? ? ? Subjective Assessment - 08/16/21 0924   ? ? Subjective Patient presents to physical therapy s/p L shoulder surgery 08/13/21   ?  Pertinent History Left rotator cuff tendon arthroscopy, tendonesis of long tendon of bicep, debridement. on 08/13/21. Patient has PMH of allergy, arthritis, CAD, ED, angina, gait disorder, stroke 2014 (R hemiparesis), HTN, insomnia, RLS, having a hard time getting out of bed.   ? Limitations Sitting;Lifting;Walking;House hold activities   ? How long can you sit comfortably? n/a   ? How long can you stand comfortably? n/a   ? How long can you walk comfortably? n/a   ? Patient Stated Goals to be able to use my L arm again   ? Currently in Pain? Yes   ? Pain Score 7    ? Pain Location Shoulder   ? Pain Orientation Left   ? Pain Descriptors / Indicators Aching   ? Pain Type Surgical pain   ? Pain Onset In the past 7 days   ? Pain Frequency Constant   ? Aggravating Factors  movement   ? Pain Relieving Factors rest, pain medication, sling   ? ?  ?  ? ?  ? ? ? ? ? [facility] PT Assessment - 08/16/21 0001   ? ?  ? Assessment  ? Medical Diagnosis L shoulder surgery   ? Onset Date/Surgical Date 08/13/21   ? Hand Dominance Right;Left   prior to stroke;  L post stroke  ? Next MD Visit 08/27/21   ?  ? Precautions  ? Precautions Shoulder   ?  ? Restrictions  ? Weight Bearing Restrictions Yes   ? LUE Weight Bearing Non weight bearing   ?  ? Balance Screen  ? Has the patient fallen in the past 6 months No   ? Has the patient had a decrease in activity level because of a fear of falling?  Yes   ? Is the patient reluctant to leave their home because of a fear of falling?  Yes   ?  ? Home Environment  ? Living Environment Private residence   ? Living Arrangements Spouse/significant other   ? Type of Home House   ? Home Access --   1 stair  ? Home Layout One level   ?  ? Prior Function  ? Level of Independence Requires assistive device for independence   ? Vocation Retired   ? Leisure reading, traveling, active with church   ? ?  ?  ? ?  ? ? ? ? ? ? ? PAIN: ? ?L shoulder ?Worse 10/10; current 7/10  ? ?R arm:  ?Worse: 6-7/10; current pain  4/10 ? ?POSTURE: ? ?RUE and RLE tone noticeable with muscle activation ?L shoulder anterior and elevated ? ?PROM/AROM: ?PROM BUE: ? Right Left  ?Shoulder Flexion Impaired by stroke  62  ?Shoulder Abduction Impaired by stroke  51  ?ER Impaired by stroke  0  ?IR Impaired by stroke  8  ?Elbow extension Impaired by stroke  -10  ?Elbow flexion  Impaired by stroke  WFL  ? ? ?AROM BUE ?Unable to test LUE actively at this time  ? ?STRENGTH:  Graded on a 0-5 scale ?Muscle strength testing deferred due to s/p surgery 08/13/21. RUE impaired s/p stroke ? ?SENSATION:  ?BUE :  ?BLE :  ? ?NEUROLOGICAL SCREEN: (2+ unless otherwise noted.) N=normal  Ab=abnormal ?  ?Level Dermatome R L  ?C3 Anterior Neck ? N N  ?C4 Top of Shoulder N N  ?C5 Lateral Upper Arm ? N N  ?C6 Lateral Arm/ Thumb ? N N  ?C7 Middle Finger ? N N  ?C8 4th & 5th Finger N N  ?T1 Medial Arm N N  ?L2 Medial thigh/groin N N  ?L3 Lower thigh/med.knee N N  ?L4 Medial leg/lat thigh N N  ?L5 Lat. leg & dorsal foot N N  ?S1 post/lat foot/thigh/leg N N  ?S2 Post./med. thigh & leg N N  ?  ?SOMATOSENSORY:  ?Any N & T in extremities or weakness: reports : ?  ?      Sensation           Intact      Diminished         Absent  ?Light touch LEs     ?                         ? ? ?FUNCTIONAL MOBILITY: ?Log rolling into/out of bed onto RUE; practiced multiple times with wife and patient for safe performance at home.  ? ?OUTCOME MEASURES: ?TEST Outcome Interpretation  ?QuickDash 88.6%    ?FOTO Perform next session due to FOTO being incorrect body part    ? ? ? ? ? ?Objective measurements completed on examination: See above findings.  ? ? ? ? ? ? ? ? PT Education - 08/16/21 0926   ? ? Education Details goals, POC, safe  bed mobility   ? Person(s) Educated Patient;Spouse   ? Methods Explanation;Demonstration;Tactile cues;Verbal cues;Handout   ? Comprehension Verbalized understanding;Returned demonstration;Verbal cues required;Tactile cues required   ? ?  ?  ? ?  ?Access Code:  [email or web address] ?URL: [email or web address] ?Date: 08/16/2021 ?Prepared by: Precious BardMarina Azula Zappia ? ?Exercises ?- Seated Scapular Retraction  - 1 x daily - 7 x weekly - 2 sets - 10 reps - 5 hold ?- Seated Shoulder Shrug Circles AROM Forward  - 1 x daily - 7 x weekly - 2 sets - 10 reps - 5 hold ?- Hand Squeezes  - 1 x daily - 7 x weekly - 2 sets - 10 reps - 5 hold ?- Tip Pinch with Putty  - 1 x daily - 7 x weekly - 2 sets - 10 reps - 5 hold ?- Key Pinch with Putty  - 1 x daily - 7 x weekly - 2 sets - 10 reps - 5 hold ? ? PT Short Term Goals - 08/16/21 0934   ? ?  ? PT SHORT TERM GOAL #1  ? Title Patient will be independent in home exercise program to improve strength/mobility for better functional independence with ADLs.   ? Baseline 4/24: HEP given   ? Time 4   ? Period Weeks   ? Status New   ? Target Date 09/13/21   ? ?  ?  ? ?  ? ? ? ? PT Long Term Goals - 08/16/21 0935   ? ?  ? PT LONG TERM GOAL #1  ? Title Patient will increase FOTO score to equal to or greater than     to demonstrate statistically significant improvement in mobility and quality of life.   ? Baseline 4/24: give next session   ? Time 12   ? Period Weeks   ? Status New   ? Target Date 11/08/21   ?  ? PT LONG TERM GOAL #2  ? Title Patient will report a worst pain of 3/10 on VAS in  L shoulder to improve tolerance with ADLs and reduced symptoms with activities.   ? Baseline 4/24: 10/10   ? Time 12   ? Period Weeks   ? Status New   ? Target Date 11/08/21   ?  ? PT LONG TERM GOAL #3  ? Title Patient will improve shoulder AROM to > 140 degrees of flexion 12and abduction for improved ability to perform overhead activities.   ? Baseline 4/24: unable to perform AROM; PROM flexion 62, abduction 51   ? Time 12   ? Period Weeks   ? Status New   ? Target Date 11/08/21   ?  ? PT LONG TERM GOAL #4  ? Title Patient will decrease Quick DASH score to >40% demonstrating reduced self-reported upper extremity disability.   ? Baseline 4/24: 88.6%   ? Time 12   ? Period  Weeks   ? Status New   ? Target Date 11/08/21   ? ?  ?  ? ?  ? ? ? ? ? ? ? ? ? Plan - 08/16/21 0928   ? ? Clinical Impression Statement Patient is status post L shoulder surgery (rotator cuff repair with bicep tenodesis)

## 2021-08-16 NOTE — Patient Instructions (Signed)
Access Code: [email or web address] ?URL: [email or web address] ?Date: 08/16/2021 ?Prepared by: Janna Arch ? ?Exercises ?- Seated Scapular Retraction  - 1 x daily - 7 x weekly - 2 sets - 10 reps - 5 hold ?- Seated Shoulder Shrug Circles AROM Forward  - 1 x daily - 7 x weekly - 2 sets - 10 reps - 5 hold ?- Hand Squeezes  - 1 x daily - 7 x weekly - 2 sets - 10 reps - 5 hold ?- Tip Pinch with Putty  - 1 x daily - 7 x weekly - 2 sets - 10 reps - 5 hold ?- Key Pinch with Putty  - 1 x daily - 7 x weekly - 2 sets - 10 reps - 5 hold ?

## 2021-08-18 ENCOUNTER — Encounter: Payer: Self-pay | Admitting: Occupational Therapy

## 2021-08-18 ENCOUNTER — Ambulatory Visit: Payer: Medicare Other | Admitting: Physical Therapy

## 2021-08-18 DIAGNOSIS — M25612 Stiffness of left shoulder, not elsewhere classified: Secondary | ICD-10-CM

## 2021-08-18 DIAGNOSIS — R278 Other lack of coordination: Secondary | ICD-10-CM | POA: Diagnosis not present

## 2021-08-18 DIAGNOSIS — M25512 Pain in left shoulder: Secondary | ICD-10-CM

## 2021-08-18 DIAGNOSIS — G8929 Other chronic pain: Secondary | ICD-10-CM

## 2021-08-19 ENCOUNTER — Ambulatory Visit: Payer: Medicare Other

## 2021-08-19 ENCOUNTER — Encounter: Payer: Self-pay | Admitting: Physical Therapy

## 2021-08-19 NOTE — Therapy (Signed)
[place] ?[facility] ?[address] ?[place], [place], [address] ?Phone: [phone]   Fax:  [phone] ? ?Physical Therapy Treatment ? ?Patient Details  ?Name: Micheal LoaJeffrey A Regas ?MRN: [id number] ?Date of Birth: [birth year] ?No data recorded ? ?Encounter Date: 08/18/2021 ? ? PT End of Session - 08/18/21 1524   ? ? Visit Number 2   ? Number of Visits 24   ? Date for PT Re-Evaluation 11/08/21   ? Authorization Type 1/10 eval 08/16/21   ? PT Start Time 1515   ? PT Stop Time 1600   ? PT Time Calculation (min) 45 min   ? Activity Tolerance Patient tolerated treatment well   ? Behavior During [facility] for tasks assessed/performed   ? ?  ?  ? ?  ? ? ?Past Medical History:  ?Diagnosis Date  ? Allergy   ? Arthritis   ? right foot  ? Coronary artery disease   ? [facility] _ Cardiologist Dr. Raynelle JanMatthew Roe  ? ED (erectile dysfunction)   ? Exertional angina (HCC) 09/08/2014  ? Folliculitis barbae   ? Gait disorder   ? History of stroke   ? Right hemiparesis  ? Hypertension   ? Insomnia   ? Other vitamin B12 deficiency anemia 07/11/2013  ? RLS (restless legs syndrome)   ? Stroke [facility](HCC)   ? 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 ? ?Past Surgical History:  ?Procedure Laterality Date  ? CARDIAC CATHETERIZATION N/A 09/08/2014  ? Procedure: Left Heart Cath and Coronary Angiography;  Surgeon: Tonny BollmanMichael Cooper, MD;  [facility];  Service: Cardiovascular;  Laterality: N/A;  ? COLONOSCOPY  03/25/11  ? CORONARY ARTERY BYPASS GRAFT  09-15-14  ? Triple  ? TRIGGER FINGER RELEASE Left 12/09/2014  ? Procedure: RELEASE TRIGGER FINGER/A-1 PULLEY LEFT THUMB, INDEX, MIDDLE, RING FINGERS;  Surgeon: Cindee SaltGary Kuzma, MD;  Location: [facility];  Service: Orthopedics;  Laterality: Left;  ? ? ?There were no vitals filed for this visit. ? ? Subjective Assessment - 08/18/21 1520   ? ? Subjective Pt presents to therapy with improved pain. Pt reports he is sleeping is  still difficult.   ? Pertinent History Left rotator cuff tendon arthroscopy, tendonesis of long tendon of bicep, debridement. on 08/13/21. Patient has PMH of allergy, arthritis, CAD, ED, angina, gait disorder, stroke 2014 (R hemiparesis), HTN, insomnia, RLS, having a hard time getting out of bed.   ? Limitations Sitting;Lifting;Walking;House hold activities   ? How long can you sit comfortably? n/a   ? How long can you stand comfortably? n/a   ? How long can you walk comfortably? n/a   ? Patient Stated Goals to be able to use my L arm again   ? Currently in Pain? Yes   ? Pain Score 3    ? Pain Location Shoulder   ? Pain Orientation Left   ? Pain Descriptors / Indicators Aching   ? Pain Type Surgical pain   ? Pain Onset In the past 7 days   ? Aggravating Factors  movement, positioning   ? Pain Relieving Factors rest, sling, pain meds   ? ?  ?  ? ?  ? ? ? ? ?  Therapeutic exercise ? ?Patient instructed in seated scapular retractions with shoulder brace donned.  Patient completed 2 rounds of 15 with 5-second holds.  Physical therapist palpated middle traps and rhomboids to ensure adequate contraction occurred. ? ?Patient  completed shoulder rolls in order to activate upper mid and lower trap musculature.  Patient also had shoulder sling donned in order to protect shoulder musculature with this activity.  No pain reported with any therapeutic exercises and completed 15 anterior rolls followed by 15 posterior rolls. ? ?Manual therapy ? ?Physical therapist had patient seated in Fowlers position and with towel roll underneath patient's left shoulder as sling was doffed.  Physical therapist performed passive range of motion along with stretching at end range for multiple reps at 30 seconds with flexion, abduction, and external rotation per pt protocol in chart.  Patient has most limitations noted with external rotation limited by pain.  Patient's abduction and flexion did improve throughout therapy and patient was quick to  ask if patient was experiencing pain or discomfort with these activities.    PT  also performed gentle soft tissue mobilization to lateral deltoid musculature as increased tightness and pain noted along this area.  Patient reports pain of 4 of 10 compared to 3 of 10 at start of therapy. ? ?Ice provided into session with no charge associated with this. X 8 min  ? ? ? ? ? ? ? ? ? ? ? ? ? ? ? ? ? ? ? ? ? ? ? ? PT Education - 08/18/21 1524   ? ? Education Details Education on pain as it related to his surgical procedure.   ? Person(s) Educated Patient   ? Methods Explanation;Demonstration;Tactile cues   ? Comprehension Verbalized understanding;Returned demonstration   ? ?  ?  ? ?  ? ? ? PT Short Term Goals - 08/16/21 0934   ? ?  ? PT SHORT TERM GOAL #1  ? Title Patient will be independent in home exercise program to improve strength/mobility for better functional independence with ADLs.   ? Baseline 4/24: HEP given   ? Time 4   ? Period Weeks   ? Status New   ? Target Date 09/13/21   ? ?  ?  ? ?  ? ? ? ? PT Long Term Goals - 08/16/21 0935   ? ?  ? PT LONG TERM GOAL #1  ? Title Patient will increase FOTO score to equal to or greater than     to demonstrate statistically significant improvement in mobility and quality of life.   ? Baseline 4/24: give next session   ? Time 12   ? Period Weeks   ? Status New   ? Target Date 11/08/21   ?  ? PT LONG TERM GOAL #2  ? Title Patient will report a worst pain of 3/10 on VAS in  L shoulder to improve tolerance with ADLs and reduced symptoms with activities.   ? Baseline 4/24: 10/10   ? Time 12   ? Period Weeks   ? Status New   ? Target Date 11/08/21   ?  ? PT LONG TERM GOAL #3  ? Title Patient will improve shoulder AROM to > 140 degrees of flexion 12and abduction for improved ability to perform overhead activities.   ? Baseline 4/24: unable to perform AROM; PROM flexion 62, abduction 51   ? Time 12   ? Period Weeks   ? Status New   ? Target Date 11/08/21   ?  ? PT LONG TERM GOAL #4   ? Title Patient will decrease Quick DASH score to >40% demonstrating reduced self-reported upper extremity disability.   ? Baseline 4/24: 88.6%   ? Time 12   ?  Period Weeks   ? Status New   ? Target Date 11/08/21   ? ?  ?  ? ?  ? ? ? ? ? ? ? ? Plan - 08/18/21 1553   ? ? Clinical Impression Statement Pt is pleasant and presents with good motivation for completion of PT exercises. Pt continued with general scapular strengthening and muscle activation per protocol and most of session consisted of PROM performed by PT to continue to improve shoulder mobility.Pt had most restrictions seocndary to pain with external rotation range of motion and he also had some pain and discomfort along his lateral deltoid musculature.  Patient was provided with ice to the left shoulder following session to prevent wrist inflammation following PT.  Patient will continue to benefit from skilled physical therapy in order to improve for shoulder range of motion.   ? Personal Factors and Comorbidities Comorbidity 3+;Fitness;Past/Current Experience;Transportation   ? Comorbidities CAD, NSTEMI, HTN, HLD, angina, hemiparesis s/p stroke, arthritis, insomnia, RLS   ? Examination-Activity Limitations Bathing;Bed Mobility;Bend;Caring for Others;Carry;Dressing;Hygiene/Grooming;Lift;Locomotion Level;Reach Overhead;Self Feeding;Sit;Transfers;Toileting;Stand   ? Examination-Participation Restrictions Cleaning;Community Activity;Interpersonal Relationship;Meal Prep;Medication Management;Laundry;Shop;Volunteer;Pincus Badder Work   ? Stability/Clinical Decision Making Evolving/Moderate complexity   ? Rehab Potential Fair   ? PT Frequency 2x / week   ? PT Duration 12 weeks   ? PT Treatment/Interventions ADLs/Self Care Home Management;Cryotherapy;Electrical Stimulation;Iontophoresis 4mg /ml Dexamethasone;Moist Heat;Traction;Ultrasound;Functional mobility training;Therapeutic activities;Therapeutic exercise;Balance training;Neuromuscular re-education;Manual  techniques;Wheelchair mobility training;Patient/family education;Passive range of motion;Dry needling;Energy conservation;Splinting;Taping;Vasopneumatic Device;Vestibular;Visual/perceptual remediation/compensation;S

## 2021-08-23 ENCOUNTER — Encounter: Payer: Self-pay | Admitting: Occupational Therapy

## 2021-08-25 ENCOUNTER — Ambulatory Visit: Payer: Medicare Other | Attending: Orthopaedic Surgery

## 2021-08-25 ENCOUNTER — Encounter: Payer: Self-pay | Admitting: Occupational Therapy

## 2021-08-25 DIAGNOSIS — G8929 Other chronic pain: Secondary | ICD-10-CM | POA: Diagnosis present

## 2021-08-25 DIAGNOSIS — M25512 Pain in left shoulder: Secondary | ICD-10-CM | POA: Insufficient documentation

## 2021-08-25 DIAGNOSIS — M6281 Muscle weakness (generalized): Secondary | ICD-10-CM | POA: Diagnosis present

## 2021-08-25 DIAGNOSIS — M25612 Stiffness of left shoulder, not elsewhere classified: Secondary | ICD-10-CM | POA: Insufficient documentation

## 2021-08-25 NOTE — Therapy (Signed)
[place] ?[facility] ?[place], [place], [address] ?Phone: [phone]   Fax:  [phone] ? ?Physical Therapy Treatment ? ?Patient Details  ?Name: Micheal Gonzalez ?MRN: [id number] ?Date of Birth: 04/21/1961 ?No data recorded ? ?Encounter Date: 08/25/2021 ? ? PT End of Session - 08/25/21 1016   ? ? Visit Number 3   ? Number of Visits 24   ? Date for PT Re-Evaluation 11/08/21   ? Authorization Type 1/10 eval 08/16/21   ? PT Start Time 1515   ? PT Stop Time [id number]   ? PT Time Calculation (min) 44 min   ? Equipment Utilized During Treatment Other (comment)   Left shoulder immobilizer brace  ? Activity Tolerance Patient tolerated treatment well;No increased pain   ? Behavior During Therapy [facility] for tasks assessed/performed   ? ?  ?  ? ?  ? ? ?Past Medical History:  ?Diagnosis Date  ? Allergy   ? Arthritis   ? right foot  ? Coronary artery disease   ? Triple Bypass [facility] at [facility] Dr. Concha Pyo  ? ED (erectile dysfunction)   ? Exertional angina ([place]) 09/08/2014  ? Folliculitis barbae   ? Gait disorder   ? History of stroke   ? Right hemiparesis  ? Hypertension   ? Insomnia   ? Other vitamin B12 deficiency anemia 07/11/2013  ? RLS (restless legs syndrome)   ? Stroke [facility])   ? 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 ? ?Past Surgical History:  ?Procedure Laterality Date  ? CARDIAC CATHETERIZATION N/A 09/08/2014  ? Procedure: Left Heart Cath and Coronary Angiography;  Surgeon: Sherren Mocha, MD;  Location: [place] CV LAB;  Service: Cardiovascular;  Laterality: N/A;  ? COLONOSCOPY  03/25/11  ? CORONARY ARTERY BYPASS GRAFT  09-15-14  ? Triple  ? TRIGGER FINGER RELEASE Left 12/09/2014  ? Procedure: RELEASE TRIGGER FINGER/A-1 PULLEY LEFT THUMB, INDEX, MIDDLE, RING FINGERS;  Surgeon: Daryll Brod, MD;  Location: [place];  Service: Orthopedics;  Laterality: Left;  ? ? ?There were no vitals filed for this visit. ? ? Subjective Assessment -  08/25/21 1018   ? ? Subjective Patient reports feeling about the same. Reports compliant with precautions and using sling appropriately.   ? Pertinent History Left rotator cuff tendon arthroscopy, tendonesis of long tendon of bicep, debridement. on 08/13/21. Patient has PMH of allergy, arthritis, CAD, ED, angina, gait disorder, stroke 2014 (R hemiparesis), HTN, insomnia, RLS, having a hard time getting out of bed.   ? Limitations Sitting;Lifting;Walking;House hold activities   ? How long can you sit comfortably? n/a   ? How long can you stand comfortably? n/a   ? How long can you walk comfortably? n/a   ? Patient Stated Goals to be able to use my L arm again   ? Currently in Pain? Yes   ? Pain Score 3    ? Pain Location Shoulder   ? Pain Orientation Left   ? Pain Descriptors / Indicators Aching   ? Pain Type Surgical pain   ? Pain Onset 1 to 4 weeks ago   ? Pain Frequency Constant   ? Aggravating Factors  any movement   ? Pain Relieving Factors Rest, sling, pain meds   ? Effect of Pain on Daily Activities pain in L shoulder with daily tasks   ? ?  ?  ? ?  ? ? ? ? ? ? ? ? ?  INTERVENTIONS:  ? ?BP: 134/94 mmHg Right arm sitting ? ? ? ?Manual Therapy: (patient positioned in supine with head of mat raised) ? ?PROM: Left Shoulder ?Shoulder Flex (approx 80 deg) - Empty end feel with pain ?Shoulder Abd (approx 70 deg) -Empty end feel with pain ?Shoulder ER/IR (at 45 deg abd) (approx 15 deg ER) ?Grade 1-2 inf/PA/AP GH joint mobs and patient denied any pain.   ?PROM to left elbow flex/ext ? ?Ice to left shoulder x 10 min unbilled at end of session. ? ? ? ? ? ? ? ? ? ? ? ? ? ? ? ? ? ? PT Education - 08/26/21 1017   ? ? Education Details Education on purpose of relaxing Left arm to decrease muscle guarding   ? Person(s) Educated Patient   ? Methods Explanation;Demonstration;Tactile cues;Verbal cues   ? Comprehension Returned demonstration;Verbal cues required;Verbalized understanding;Tactile cues required   ? ?  ?  ? ?  ? ? ? PT  Short Term Goals - 08/16/21 0934   ? ?  ? PT SHORT TERM GOAL #1  ? Title Patient will be independent in home exercise program to improve strength/mobility for better functional independence with ADLs.   ? Baseline 4/24: HEP given   ? Time 4   ? Period Weeks   ? Status New   ? Target Date 09/13/21   ? ?  ?  ? ?  ? ? ? ? PT Long Term Goals - 08/16/21 0935   ? ?  ? PT LONG TERM GOAL #1  ? Title Patient will increase FOTO score to equal to or greater than     to demonstrate statistically significant improvement in mobility and quality of life.   ? Baseline 4/24: give next session   ? Time 12   ? Period Weeks   ? Status New   ? Target Date 11/08/21   ?  ? PT LONG TERM GOAL #2  ? Title Patient will report a worst pain of 3/10 on VAS in  L shoulder to improve tolerance with ADLs and reduced symptoms with activities.   ? Baseline 4/24: 10/10   ? Time 12   ? Period Weeks   ? Status New   ? Target Date 11/08/21   ?  ? PT LONG TERM GOAL #3  ? Title Patient will improve shoulder AROM to > 140 degrees of flexion 12and abduction for improved ability to perform overhead activities.   ? Baseline 4/24: unable to perform AROM; PROM flexion 62, abduction 51   ? Time 12   ? Period Weeks   ? Status New   ? Target Date 11/08/21   ?  ? PT LONG TERM GOAL #4  ? Title Patient will decrease Quick DASH score to >40% demonstrating reduced self-reported upper extremity disability.   ? Baseline 4/24: 88.6%   ? Time 12   ? Period Weeks   ? Status New   ? Target Date 11/08/21   ? ?  ?  ? ?  ? ? ? ? ? ? ? ? Plan - 08/25/21 1015   ? ? Clinical Impression Statement Patient presented with good motivation today today's session. He presented with good ability to relax his arm for optimal PROM with minimal report of pain- only at end range. He reported no worsening of left shoulder soreness after session. Following shoulder protocol and patient most limited with shoulder ER ROM. Patient will continue to benefit from skilled physical therapy in order to  improve for shoulder range of motion.   ? Personal Factors and Comorbidities Comorbidity 3+;Fitness;Past/Current Experience;Transportation   ? Comorbidities CAD, NSTEMI, HTN, HLD, angina, hemiparesis s/p stroke, arthritis, insomnia, RLS   ? Examination-Activity Limitations Bathing;Bed Mobility;Bend;Caring for Others;Carry;Dressing;Hygiene/Grooming;Lift;Locomotion Level;Reach Overhead;Self Feeding;Sit;Transfers;Toileting;Stand   ? Examination-Participation Restrictions Cleaning;Community Activity;Interpersonal Relationship;Meal Prep;Medication Management;Laundry;Shop;Volunteer;[place] Work   ? Stability/Clinical Decision Making Evolving/Moderate complexity   ? Rehab Potential Fair   ? PT Frequency 2x / week   ? PT Duration 12 weeks   ? PT Treatment/Interventions ADLs/Self Care Home Management;Cryotherapy;Electrical Stimulation;Iontophoresis 4mg /ml Dexamethasone;Moist Heat;Traction;Ultrasound;Functional mobility training;Therapeutic activities;Therapeutic exercise;Balance training;Neuromuscular re-education;Manual techniques;Wheelchair mobility training;Patient/family education;Passive range of motion;Dry needling;Energy conservation;Splinting;Taping;Vasopneumatic Device;Vestibular;Visual/perceptual remediation/compensation;Spinal Manipulations;Joint Manipulations   ? PT Next Visit Plan follow protocol for PROM , do FOTO   ? PT Home Exercise Plan see above   ? Consulted and Agree with Plan of Care Patient   ? ?  ?  ? ?  ? ? ?Patient will benefit from skilled therapeutic intervention in order to improve the following deficits and impairments:  Decreased coordination, Decreased mobility, Decreased range of motion, Decreased strength, Decreased scar mobility, Hypomobility, Impaired flexibility, Impaired perceived functional ability, Impaired tone, Impaired UE functional use, Postural dysfunction, Improper body mechanics, Pain ? ?Visit Diagnosis: ?Stiffness of left shoulder, not elsewhere classified ? ?Acute pain of left  shoulder ? ?Muscle weakness (generalized) ? ? ? ? ?Problem List ?Patient Active Problem List  ? Diagnosis Date Noted  ? Coronary atherosclerosis of native coronary artery 11/04/2014  ? NSTEMI (non-ST eleva

## 2021-08-31 ENCOUNTER — Ambulatory Visit: Payer: Medicare Other | Admitting: Physical Therapy

## 2021-08-31 DIAGNOSIS — M25512 Pain in left shoulder: Secondary | ICD-10-CM

## 2021-08-31 DIAGNOSIS — M25612 Stiffness of left shoulder, not elsewhere classified: Secondary | ICD-10-CM

## 2021-08-31 DIAGNOSIS — M6281 Muscle weakness (generalized): Secondary | ICD-10-CM

## 2021-08-31 DIAGNOSIS — G8929 Other chronic pain: Secondary | ICD-10-CM

## 2021-08-31 NOTE — Therapy (Signed)
[place] [facility] MAIN [facility] SERVICES [address] [place], [place], [address] Phone: [phone]   Fax:  [phone]  Physical Therapy Treatment  Patient Details  Name: Micheal Gonzalez MRN: [id number] Date of Birth: 1960/12/09 No data recorded  Encounter Date: 08/31/2021   PT End of Session - 08/31/21 1015     Visit Number 4    Number of Visits 24    Date for PT Re-Evaluation 11/08/21    Authorization Type 1/10 eval 08/16/21    PT Start Time 0930    PT Stop Time 1015    PT Time Calculation (min) 45 min    Equipment Utilized During Treatment Other (comment)   Left shoulder immobilizer brace   Activity Tolerance Patient tolerated treatment well;No increased pain    Behavior During Therapy [facility] for tasks assessed/performed             Past Medical History:  Diagnosis Date   Allergy    Arthritis    right foot   Coronary artery disease    Triple Bypass done at [facility] Cardiologist Dr. Raynelle Jan   ED (erectile dysfunction)    Exertional angina (HCC) 09/08/2014   Folliculitis barbae    Gait disorder    History of stroke    Right hemiparesis   Hypertension    Insomnia    Other vitamin B12 deficiency anemia 07/11/2013   RLS (restless legs syndrome)    Stroke [facility])    June 2014- partial paralysis on right side    Past Surgical History:  Procedure Laterality Date   CARDIAC CATHETERIZATION N/A 09/08/2014   Procedure: Left Heart Cath and Coronary Angiography;  Surgeon: Tonny Bollman, MD;  Location: [facility] INVASIVE CV LAB;  Service: Cardiovascular;  Laterality: N/A;   COLONOSCOPY  03/25/11   CORONARY ARTERY BYPASS GRAFT  09-15-14   Triple   TRIGGER FINGER RELEASE Left 12/09/2014   Procedure: RELEASE TRIGGER FINGER/A-1 PULLEY LEFT THUMB, INDEX, MIDDLE, RING FINGERS;  Surgeon: Cindee Salt, MD;  Location: [facility];  Service: Orthopedics;  Laterality: Left;    There were no vitals filed for this visit.   Subjective Assessment -  08/31/21 1011     Subjective Patient reports feeling about the same. Reports difficulty with sling positioning. PT assisted with adjustments to sling. Pt reports ipmroved comfort with sling following and was in structed in easy adjustment strategies.    Pertinent History Left rotator cuff tendon arthroscopy, tendonesis of long tendon of bicep, debridement. on 08/13/21. Patient has PMH of allergy, arthritis, CAD, ED, angina, gait disorder, stroke 2014 (R hemiparesis), HTN, insomnia, RLS, having a hard time getting out of bed.    Limitations Sitting;Lifting;Walking;House hold activities    How long can you sit comfortably? n/a    How long can you stand comfortably? n/a    How long can you walk comfortably? n/a    Patient Stated Goals to be able to use my L arm again    Pain Onset 1 to 4 weeks ago                Therapeutic exercise  Patient instructed in seated scapular retractions with shoulder brace donned.  Patient completed 2 rounds of 15 with 5-second holds.  Physical therapist palpated middle traps and rhomboids to ensure adequate contraction occurred.  Patient completed shoulder rolls in order to activate upper mid and lower trap musculature.  Patient also had shoulder sling donned in order to protect shoulder musculature with  this activity.  No pain reported with any therapeutic exercises and completed 15 anterior rolls followed by 15 posterior rolls.  Manual therapy  Physical therapist had patient seated in supine position and with towel roll underneath patient's left shoulder as sling was doffed.  Physical therapist performed passive range of motion along with stretching at end range for multiple reps at 30 seconds with flexion, abduction, and external rotation (at 0 and 25 abduction) per pt protocol in chart.  Patient has most limitations noted with external rotation limited by pain.  Patient's abduction and flexion did improve throughout therapy and patient was quick to ask if  patient was experiencing pain or discomfort with these activities.  Pt demonstrated close to 80 degrees ABD and flexion ROM this session with increased stretching.    Ice provided into session with no charge associated with this. X 8 min                                 PT Short Term Goals - 08/16/21 0934       PT SHORT TERM GOAL #1   Title Patient will be independent in home exercise program to improve strength/mobility for better functional independence with ADLs.    Baseline 4/24: HEP given    Time 4    Period Weeks    Status New    Target Date 09/13/21               PT Long Term Goals - 08/16/21 0935       PT LONG TERM GOAL #1   Title Patient will increase FOTO score to equal to or greater than     to demonstrate statistically significant improvement in mobility and quality of life.    Baseline 4/24: give next session    Time 12    Period Weeks    Status New    Target Date 11/08/21      PT LONG TERM GOAL #2   Title Patient will report a worst pain of 3/10 on VAS in  L shoulder to improve tolerance with ADLs and reduced symptoms with activities.    Baseline 4/24: 10/10    Time 12    Period Weeks    Status New    Target Date 11/08/21      PT LONG TERM GOAL #3   Title Patient will improve shoulder AROM to > 140 degrees of flexion 12and abduction for improved ability to perform overhead activities.    Baseline 4/24: unable to perform AROM; PROM flexion 62, abduction 51    Time 12    Period Weeks    Status New    Target Date 11/08/21      PT LONG TERM GOAL #4   Title Patient will decrease Quick DASH score to >40% demonstrating reduced self-reported upper extremity disability.    Baseline 4/24: 88.6%    Time 12    Period Weeks    Status New    Target Date 11/08/21                   Plan - 08/31/21 1016     Clinical Impression Statement Patient presented with good motivation today today's session.  Following shoulder  protocol and patient most limited with shoulder ER ROM. Pt had assistance with adjustment of his shoulder brace was provided with instructions for easy adjustments in future.  Patient did demonstrate improved shoulder elevation with  forward flexion and abduction.  Patient instructed to continue with adherence to protocol and all questions answered at end of session.  Patient will continue to benefit from skilled physical therapy in order to improve for shoulder range of motion.    Personal Factors and Comorbidities Comorbidity 3+;Fitness;Past/Current Experience;Transportation    Comorbidities CAD, NSTEMI, HTN, HLD, angina, hemiparesis s/p stroke, arthritis, insomnia, RLS    Examination-Activity Limitations Bathing;Bed Mobility;Bend;Caring for Others;Carry;Dressing;Hygiene/Grooming;Lift;Locomotion Level;Reach Overhead;Self Feeding;Sit;Transfers;Toileting;Stand    Examination-Participation Restrictions Cleaning;Community Activity;Interpersonal Relationship;Meal Prep;Medication Management;Laundry;Shop;Volunteer;Yard Work    Conservation officer, historic buildings Evolving/Moderate complexity    Rehab Potential Fair    PT Frequency 2x / week    PT Duration 12 weeks    PT Treatment/Interventions ADLs/Self Care Home Management;Cryotherapy;Electrical Stimulation;Iontophoresis 4mg /ml Dexamethasone;Moist Heat;Traction;Ultrasound;Functional mobility training;Therapeutic activities;Therapeutic exercise;Balance training;Neuromuscular re-education;Manual techniques;Wheelchair mobility training;Patient/family education;Passive range of motion;Dry needling;Energy conservation;Splinting;Taping;Vasopneumatic Device;Vestibular;Visual/perceptual remediation/compensation;Spinal Manipulations;Joint Manipulations    PT Next Visit Plan follow protocol for PROM , do FOTO    PT Home Exercise Plan see above    Consulted and Agree with Plan of Care Patient             Patient will benefit from skilled therapeutic intervention  in order to improve the following deficits and impairments:  Decreased coordination, Decreased mobility, Decreased range of motion, Decreased strength, Decreased scar mobility, Hypomobility, Impaired flexibility, Impaired perceived functional ability, Impaired tone, Impaired UE functional use, Postural dysfunction, Improper body mechanics, Pain  Visit Diagnosis: Stiffness of left shoulder, not elsewhere classified  Acute pain of left shoulder  Chronic left shoulder pain  Muscle weakness (generalized)     Problem List Patient Active Problem List   Diagnosis Date Noted   Coronary atherosclerosis of native coronary artery 11/04/2014   NSTEMI (non-ST elevated myocardial infarction) (HCC)    Coronary artery disease due to lipid rich plaque    Hyperlipidemia    Essential hypertension    Exertional angina (HCC) 09/08/2014   Unstable angina pectoris (HCC) 09/08/2014   Burning chest pain 08/22/2014   Other vitamin B12 deficiency anemia 07/11/2013   Hemiparesis and alteration of sensations as late effects of stroke (HCC) 07/10/2013   Arthritis 06/25/2013   Insomnia, persistent 12/20/2012   Spastic hemiplegia affecting dominant side (HCC) 12/07/2012   CVA (cerebral infarction) 10/11/2012   Unsteady gait 10/09/2012   HTN (hypertension) 05/31/2012   Family history of CVA 05/17/2012   History of renal stone 05/17/2012   RLS (restless legs syndrome) 01/06/2011   Allergic rhinitis due to pollen 01/06/2011   ED (erectile dysfunction) 01/06/2011   Family history of colon cancer 01/06/2011   Family history of heart disease in male family member before age 8 01/06/2011    Norman Herrlich, PT 08/31/2021, 10:37 AM  [place] [facility] MAIN [facility] SERVICES [address] [place], [place], [address] Phone: [phone]   Fax:  [phone]  Name: RHONDA LEDSOME MRN: [id number] Date of Birth: [birth year]

## 2021-09-02 ENCOUNTER — Ambulatory Visit: Payer: Medicare Other

## 2021-09-02 DIAGNOSIS — M25612 Stiffness of left shoulder, not elsewhere classified: Secondary | ICD-10-CM

## 2021-09-02 NOTE — Therapy (Signed)
[place] [facility] MAIN REHAB SERVICES ?[address] ?[place], [place], [address] ?Phone: [phone]   Fax:  [phone] ? ?Physical Therapy Treatment ? ?Patient Details  ?Name: Micheal Gonzalez ?MRN: [id number] ?Date of Birth: 08-07-1960 ?No data recorded ? ?Encounter Date: 09/02/2021 ? ? PT End of Session - 09/02/21 1612   ? ? Visit Number 5   ? Number of Visits 24   ? Date for PT Re-Evaluation 11/08/21   ? Authorization Type 1/10 eval 08/16/21   ? PT Start Time 1514   ? PT Stop Time 1554   ? PT Time Calculation (min) 40 min   ? Equipment Utilized During Treatment Other (comment)   Left shoulder immobilizer brace  ? Activity Tolerance Patient tolerated treatment well;No increased pain   ? Behavior [facility] for tasks assessed/performed   ? ?  ?  ? ?  ? ? ?Past Medical History:  ?Diagnosis Date  ? Allergy   ? Arthritis   ? right foot  ? Coronary artery disease   ? [facility] _ Cardiologist Dr. Raynelle JanMatthew Roe  ? ED (erectile dysfunction)   ? Exertional angina (HCC) 09/08/2014  ? Folliculitis barbae   ? Gait disorder   ? History of stroke   ? Right hemiparesis  ? Hypertension   ? Insomnia   ? Other vitamin B12 deficiency anemia 07/11/2013  ? RLS (restless legs syndrome)   ? Stroke [facility](HCC)   ? 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 ? ?Past Surgical History:  ?Procedure Laterality Date  ? CARDIAC CATHETERIZATION N/A 09/08/2014  ? Procedure: Left Heart Cath and Coronary Angiography;  Surgeon: Tonny BollmanMichael Cooper, MD;  [facility] CV LAB;  Service: Cardiovascular;  Laterality: N/A;  ? COLONOSCOPY  03/25/11  ? CORONARY ARTERY BYPASS GRAFT  09-15-14  ? Triple  ? TRIGGER FINGER RELEASE Left 12/09/2014  ? Procedure: RELEASE TRIGGER FINGER/A-1 PULLEY LEFT THUMB, INDEX, MIDDLE, RING FINGERS;  Surgeon: Cindee SaltGary Kuzma, MD;  Location: [facility];  Service: Orthopedics;  Laterality: Left;  ? ? ?There were no vitals filed for this visit. ? ? Subjective Assessment -  09/02/21 1515   ? ? Subjective Pt reports very little pain at rest currently. Pt reports pain is 1/10. Pt reports exercises have been going well.   ? Pertinent History Left rotator cuff tendon arthroscopy, tendonesis of long tendon of bicep, debridement. on 08/13/21. Patient has PMH of allergy, arthritis, CAD, ED, angina, gait disorder, stroke 2014 (R hemiparesis), HTN, insomnia, RLS, having a hard time getting out of bed.   ? Limitations Sitting;Lifting;Walking;House hold activities   ? How long can you sit comfortably? n/a   ? How long can you stand comfortably? n/a   ? How long can you walk comfortably? n/a   ? Patient Stated Goals to be able to use my L arm again   ? Currently in Pain? Yes   ? Pain Score 1    ? Pain Location Shoulder   ? Pain Orientation Left   ? Pain Onset 1 to 4 weeks ago   ? Pain Type Surgical pain   ? Pain Onset 1 to 4 weeks ago   ? ?  ?  ? ?  ? ? ? ?INTERVENTIONS ? ? ?On plinth: ?Manual therapy ?-Towel roll underneath patient's left shoulder as sling doffed: ?-Physical therapist performed PROM along with stretching at available end range x multiple reps 20-30 second bouts with flexion, abduction,  and external rotation (0 degrees and 25 deg. abduction) per protocol.   ?Comments: Pt continues to exhibited greatest limitation with ER. Pt continues to achieve around 80 deg. Flexion and abduction. Limited feeling of tightness. ? ?TherEx: ?L wrist flex/ext 10x each  ? ?PT dons L shoulder brace  ? ?Seated: ?Scapular retractions 20x with 5 sec holds. TC to ensure correct technique.  ?Shoulder rolls CW/CC 20x for each.  ?Comments: no pain with interventions ? ?Ice donned to L shoulder x 4 minutes (unbilled) ? ?Pt educated throughout session about proper posture and technique with exercises. Improved exercise technique, movement at target joints, use of target muscles after min to mod verbal, visual, tactile cues. ? ? ? Subjective Assessment - 09/02/21 1515   ? ? Subjective Pt reports very little pain  at rest currently. Pt reports pain is 1/10. Pt reports exercises have been going well.   ? Pertinent History Left rotator cuff tendon arthroscopy, tendonesis of long tendon of bicep, debridement. on 08/13/21. Patient has PMH of allergy, arthritis, CAD, ED, angina, gait disorder, stroke 2014 (R hemiparesis), HTN, insomnia, RLS, having a hard time getting out of bed.   ? Limitations Sitting;Lifting;Walking;House hold activities   ? How long can you sit comfortably? n/a   ? How long can you stand comfortably? n/a   ? How long can you walk comfortably? n/a   ? Patient Stated Goals to be able to use my L arm again   ? Currently in Pain? Yes   ? Pain Score 1    ? Pain Location Shoulder   ? Pain Orientation Left   ? Pain Onset 1 to 4 weeks ago   ? Pain Type Surgical pain   ? Pain Onset 1 to 4 weeks ago   ? ?  ?  ? ?  ? ? ? ? PT Education - 09/02/21 1553   ? ? Education Details exercise technique   ? Person(s) Educated Patient   ? Methods Explanation;Tactile cues;Demonstration   ? Comprehension Verbalized understanding;Returned demonstration;Tactile cues required   ? ?  ?  ? ?  ? ? ? PT Short Term Goals - 08/16/21 0934   ? ?  ? PT SHORT TERM GOAL #1  ? Title Patient will be independent in home exercise program to improve strength/mobility for better functional independence with ADLs.   ? Baseline 4/24: HEP given   ? Time 4   ? Period Weeks   ? Status New   ? Target Date 09/13/21   ? ?  ?  ? ?  ? ? ? ? PT Long Term Goals - 08/16/21 0935   ? ?  ? PT LONG TERM GOAL #1  ? Title Patient will increase FOTO score to equal to or greater than     to demonstrate statistically significant improvement in mobility and quality of life.   ? Baseline 4/24: give next session   ? Time 12   ? Period Weeks   ? Status New   ? Target Date 11/08/21   ?  ? PT LONG TERM GOAL #2  ? Title Patient will report a worst pain of 3/10 on VAS in  L shoulder to improve tolerance with ADLs and reduced symptoms with activities.   ? Baseline 4/24: 10/10   ? Time  12   ? Period Weeks   ? Status New   ? Target Date 11/08/21   ?  ? PT LONG TERM GOAL #3  ? Title Patient will  improve shoulder AROM to > 140 degrees of flexion 12and abduction for improved ability to perform overhead activities.   ? Baseline 4/24: unable to perform AROM; PROM flexion 62, abduction 51   ? Time 12   ? Period Weeks   ? Status New   ? Target Date 11/08/21   ?  ? PT LONG TERM GOAL #4  ? Title Patient will decrease Quick DASH score to >40% demonstrating reduced self-reported upper extremity disability.   ? Baseline 4/24: 88.6%   ? Time 12   ? Period Weeks   ? Status New   ? Target Date 11/08/21   ? ?  ?  ? ?  ? ? ? ? ? ? ? ? Plan - 09/02/21 1613   ? ? Clinical Impression Statement Continued focus on PROM per protocol. Pt tolerated interventions well without increased pain. Degrees of flx/abd achieved with PROM similar to previous sessions. Pt overall exhibited good technique/activiation with scapular interventions. The pt will benefit from further skilled PT to improve L shoulder ROM.   ? Personal Factors and Comorbidities Comorbidity 3+;Fitness;Past/Current Experience;Transportation   ? Comorbidities CAD, NSTEMI, HTN, HLD, angina, hemiparesis s/p stroke, arthritis, insomnia, RLS   ? Examination-Activity Limitations Bathing;Bed Mobility;Bend;Caring for Others;Carry;Dressing;Hygiene/Grooming;Lift;Locomotion Level;Reach Overhead;Self Feeding;Sit;Transfers;Toileting;Stand   ? Examination-Participation Restrictions Cleaning;Community Activity;Interpersonal Relationship;Meal Prep;Medication Management;Laundry;Shop;Volunteer;Pincus Badder Work   ? Stability/Clinical Decision Making Evolving/Moderate complexity   ? Rehab Potential Fair   ? PT Frequency 2x / week   ? PT Duration 12 weeks   ? PT Treatment/Interventions ADLs/Self Care Home Management;Cryotherapy;Electrical Stimulation;Iontophoresis 4mg /ml Dexamethasone;Moist Heat;Traction;Ultrasound;Functional mobility training;Therapeutic activities;Therapeutic  exercise;Balance training;Neuromuscular re-education;Manual techniques;Wheelchair mobility training;Patient/family education;Passive range of motion;Dry needling;Energy conservation;Splinting;Taping;Vasopneum

## 2021-09-06 ENCOUNTER — Ambulatory Visit: Payer: Medicare Other

## 2021-09-06 DIAGNOSIS — G8929 Other chronic pain: Secondary | ICD-10-CM

## 2021-09-06 DIAGNOSIS — M25612 Stiffness of left shoulder, not elsewhere classified: Secondary | ICD-10-CM

## 2021-09-06 NOTE — Therapy (Signed)
[place] ?[facility] ?[address] ?[place], [place], [address] ?Phone: [phone]   Fax:  [phone] ? ?Physical Therapy Treatment ? ?Patient Details  ?Name: Micheal Gonzalez ?MRN: [id number] ?Date of Birth: 1960-11-22 ?No data recorded ? ?Encounter Date: 09/06/2021 ? ? PT End of Session - 09/06/21 1724   ? ? Visit Number 6   ? Number of Visits 24   ? Date for PT Re-Evaluation 11/08/21   ? Authorization Type 1/10 eval 08/16/21   ? PT Start Time 1434   ? PT Stop Time 1515   ? PT Time Calculation (min) 41 min   ? Equipment Utilized During Treatment Other (comment)   Left shoulder immobilizer brace  ? Activity Tolerance Patient tolerated treatment well;No increased pain   ? Behavior During Therapy [facility] for tasks assessed/performed   ? ?  ?  ? ?  ? ? ?Past Medical History:  ?Diagnosis Date  ? Allergy   ? Arthritis   ? right foot  ? Coronary artery disease   ? Triple Bypass done at [facility] Dr. Raynelle Jan  ? ED (erectile dysfunction)   ? Exertional angina (HCC) 09/08/2014  ? Folliculitis barbae   ? Gait disorder   ? History of stroke   ? Right hemiparesis  ? Hypertension   ? Insomnia   ? Other vitamin B12 deficiency anemia 07/11/2013  ? RLS (restless legs syndrome)   ? Stroke [facility])   ? 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 ? ?Past Surgical History:  ?Procedure Laterality Date  ? CARDIAC CATHETERIZATION N/A 09/08/2014  ? Procedure: Left Heart Cath and Coronary Angiography;  Surgeon: Tonny Bollman, MD;  Location: [facility] INVASIVE CV LAB;  Service: Cardiovascular;  Laterality: N/A;  ? COLONOSCOPY  03/25/11  ? CORONARY ARTERY BYPASS GRAFT  09-15-14  ? Triple  ? TRIGGER FINGER RELEASE Left 12/09/2014  ? Procedure: RELEASE TRIGGER FINGER/A-1 PULLEY LEFT THUMB, INDEX, MIDDLE, RING FINGERS;  Surgeon: Cindee Salt, MD;  Location: [facility];  Service: Orthopedics;  Laterality: Left;  ? ? ?There were no vitals filed for this visit. ? ? Subjective Assessment -  09/06/21 1432   ? ? Subjective Pt rates shoulder pain as 2/10   ? Pertinent History Left rotator cuff tendon arthroscopy, tendonesis of long tendon of bicep, debridement. on 08/13/21. Patient has PMH of allergy, arthritis, CAD, ED, angina, gait disorder, stroke 2014 (R hemiparesis), HTN, insomnia, RLS, having a hard time getting out of bed.   ? Limitations Sitting;Lifting;Walking;House hold activities   ? How long can you sit comfortably? n/a   ? How long can you stand comfortably? n/a   ? How long can you walk comfortably? n/a   ? Patient Stated Goals to be able to use my L arm again   ? Currently in Pain? Yes   ? Pain Location Shoulder   ? Pain Orientation Left   ? Pain Onset 1 to 4 weeks ago   ? Pain Onset 1 to 4 weeks ago   ? ?  ?  ? ?  ? ?INTERVENTIONS ?  ?PT provided pt with copy of his surgical protocol regarding information about sling.  ? ?On plinth: ?Manual therapy ?-Towel roll underneath patient's left shoulder as PT doffs sling: ?-Physical therapist performed PROM along with stretching at available end range x multiple reps 20-30 second bouts with flexion, abduction, and external rotation (0 degrees and 25 deg. abduction) per protocol. Increased focus on  ER this visit as this is area of greatest restriction. Pt observed achieving approx 10-15 deg. ?PROM L elbow flexion/extension 20x ? ?TherEx: ?L wrist flex/ext 10x for each ?L wrist circles cw/cc 10x for each ?  ?PT dons L shoulder brace  ?  ?Seated: ice donned to L shoulder while pt performs the following ?Scapular retractions 20x. TC to ensure correct technique.  ?Shoulder rolls CW/CC 10x for each. ?Comments: no pain with interventions ? ?Pt educated throughout session about proper posture and technique with exercises. Improved exercise technique, movement at target joints, use of target muscles after min to mod verbal, visual, tactile cues. ? ? PT Education - 09/06/21 1723   ? ? Education Details restrictions per protocol, exercise technique   ?  Person(s) Educated Patient   ? Methods Explanation;Demonstration;Tactile cues;Handout;Verbal cues   ? Comprehension Verbalized understanding;Returned demonstration;Need further instruction   ? ?  ?  ? ?  ? ? ? PT Short Term Goals - 08/16/21 0934   ? ?  ? PT SHORT TERM GOAL #1  ? Title Patient will be independent in home exercise program to improve strength/mobility for better functional independence with ADLs.   ? Baseline 4/24: HEP given   ? Time 4   ? Period Weeks   ? Status New   ? Target Date 09/13/21   ? ?  ?  ? ?  ? ? ? ? PT Long Term Goals - 08/16/21 0935   ? ?  ? PT LONG TERM GOAL #1  ? Title Patient will increase FOTO score to equal to or greater than     to demonstrate statistically significant improvement in mobility and quality of life.   ? Baseline 4/24: give next session   ? Time 12   ? Period Weeks   ? Status New   ? Target Date 11/08/21   ?  ? PT LONG TERM GOAL #2  ? Title Patient will report a worst pain of 3/10 on VAS in  L shoulder to improve tolerance with ADLs and reduced symptoms with activities.   ? Baseline 4/24: 10/10   ? Time 12   ? Period Weeks   ? Status New   ? Target Date 11/08/21   ?  ? PT LONG TERM GOAL #3  ? Title Patient will improve shoulder AROM to > 140 degrees of flexion 12and abduction for improved ability to perform overhead activities.   ? Baseline 4/24: unable to perform AROM; PROM flexion 62, abduction 51   ? Time 12   ? Period Weeks   ? Status New   ? Target Date 11/08/21   ?  ? PT LONG TERM GOAL #4  ? Title Patient will decrease Quick DASH score to >40% demonstrating reduced self-reported upper extremity disability.   ? Baseline 4/24: 88.6%   ? Time 12   ? Period Weeks   ? Status New   ? Target Date 11/08/21   ? ?  ?  ? ?  ? ? ? ? ? ? ? ? Plan - 09/06/21 1724   ? ? Clinical Impression Statement Continued interventions per protocol with emphasis on improving PROM of L shoulder ER, as this remains pt's greatest area of restriction. Pt tolerated PROM well without increased  pain. The pt will benefit from further skilled PT to improve L shoulder pain and ROM.   ? Personal Factors and Comorbidities Comorbidity 3+;Fitness;Past/Current Experience;Transportation   ? Comorbidities CAD, NSTEMI, HTN, HLD, angina, hemiparesis s/p stroke, arthritis,  insomnia, RLS   ? Examination-Activity Limitations Bathing;Bed Mobility;Bend;Caring for Others;Carry;Dressing;Hygiene/Grooming;Lift;Locomotion Level;Reach Overhead;Self Feeding;Sit;Transfers;Toileting;Stand   ? Examination-Participation Restrictions Cleaning;Community Activity;Interpersonal Relationship;Meal Prep;Medication Management;Laundry;Shop;Volunteer;P[employer]ard Work   ? Stability/Clinical Decision Making Evolving/Moderate complexity   ? Rehab Potential Fair   ? PT Frequency 2x / week   ? PT Duration 12 weeks   ? PT Treatment/Interventions ADLs/Self Care Home Management;Cryotherapy;Electrical Stimulation;Iontophoresis 4mg /ml Dexamethasone;Moist Heat;Traction;Ultrasound;Functional mobility training;Therapeutic activities;Therapeutic exercise;Balance training;Neuromuscular re-education;Manual techniques;Wheelchair mobility training;Patient/family education;Passive range of motion;Dry needling;Energy conservation;Splinting;Taping;Vasopneumatic Device;Vestibular;Visual/perceptual remediation/compensation;Spinal Manipulations;Joint Manipulations   ? PT Next Visit Plan follow protocol for PROM , do FOTO   ? PT Home Exercise Plan see above; no updates   ? Consulted and Agree with Plan of Care Patient   ? ?  ?  ? ?  ? ? ?Patient will benefit from skilled therapeutic intervention in order to improve the following deficits and impairments:  Decreased coordination, Decreased mobility, Decreased range of motion, Decreased strength, Decreased scar mobility, Hypomobility, Impaired flexibility, Impaired perceived functional ability, Impaired tone, Impaired UE functional use, Postural dysfunction, Improper body mechanics, Pain ? ?Visit Diagnosis: ?Stiffness of left  shoulder, not elsewhere classified ? ?Chronic left shoulder pain ? ? ? ? ?Problem List ?Patient Active Problem List  ? Diagnosis Date Noted  ? Coronary atherosclerosis of native coronary artery 11/04/2014

## 2021-09-09 ENCOUNTER — Encounter: Payer: Self-pay | Admitting: Physical Therapy

## 2021-09-09 ENCOUNTER — Ambulatory Visit: Payer: Medicare Other | Admitting: Physical Therapy

## 2021-09-09 DIAGNOSIS — M25612 Stiffness of left shoulder, not elsewhere classified: Secondary | ICD-10-CM | POA: Diagnosis not present

## 2021-09-09 DIAGNOSIS — M6281 Muscle weakness (generalized): Secondary | ICD-10-CM

## 2021-09-09 DIAGNOSIS — G8929 Other chronic pain: Secondary | ICD-10-CM

## 2021-09-09 DIAGNOSIS — M25512 Pain in left shoulder: Secondary | ICD-10-CM

## 2021-09-09 NOTE — Therapy (Signed)
[place] [facility] MAIN [facility] SERVICES [address] [place], [place], [address] Phone: [phone]   Fax:  [phone]  Physical Therapy Treatment  Patient Details  Name: Micheal Gonzalez MRN: [id number] Date of Birth: [birth year] No data recorded  Encounter Date: 09/09/2021   PT End of Session - 09/10/21 1255     Visit Number 7    Number of Visits 24    Date for PT Re-Evaluation 11/08/21    Authorization Type 1/10 eval 08/16/21    PT Start Time 1432    PT Stop Time 1516    PT Time Calculation (min) 44 min    Equipment Utilized During Treatment Other (comment)   Left shoulder immobilizer brace   Activity Tolerance Patient tolerated treatment well;No increased pain    Behavior During Therapy [facility] for tasks assessed/performed             Past Medical History:  Diagnosis Date   Allergy    Arthritis    right foot   Coronary artery disease    Triple Bypass done at [facility] Cardiologist Dr. Raynelle Jan   ED (erectile dysfunction)    Exertional angina (HCC) 09/08/2014   Folliculitis barbae    Gait disorder    History of stroke    Right hemiparesis   Hypertension    Insomnia    Other vitamin B12 deficiency anemia 07/11/2013   RLS (restless legs syndrome)    Stroke [facility])    June 2014- partial paralysis on right side    Past Surgical History:  Procedure Laterality Date   CARDIAC CATHETERIZATION N/A 09/08/2014   Procedure: Left Heart Cath and Coronary Angiography;  Surgeon: Tonny Bollman, MD;  Location: [facility] INVASIVE CV LAB;  Service: Cardiovascular;  Laterality: N/A;   COLONOSCOPY  03/25/11   CORONARY ARTERY BYPASS GRAFT  09-15-14   Triple   TRIGGER FINGER RELEASE Left 12/09/2014   Procedure: RELEASE TRIGGER FINGER/A-1 PULLEY LEFT THUMB, INDEX, MIDDLE, RING FINGERS;  Surgeon: Cindee Salt, MD;  Location:  SURGERY CENTER;  Service: Orthopedics;  Laterality: Left;    There were no vitals filed for this visit.   Subjective Assessment -  09/09/21 1441     Subjective Pt reports increased soreness after last session. He reports its been  bothering him in the left arm (lateral and along biceps). "it kind of feels like it was prior to the surgery." Worst pain has been 5/10 since Monday;    Pertinent History Left rotator cuff tendon arthroscopy, tendonesis of long tendon of bicep, debridement. on 08/13/21. Patient has PMH of allergy, arthritis, CAD, ED, angina, gait disorder, stroke 2014 (R hemiparesis), HTN, insomnia, RLS, having a hard time getting out of bed.    Limitations Sitting;Lifting;Walking;House hold activities    How long can you sit comfortably? n/a    How long can you stand comfortably? n/a    How long can you walk comfortably? n/a    Patient Stated Goals to be able to use my L arm again    Currently in Pain? Yes    Pain Score 4     Pain Location Shoulder    Pain Orientation Left    Pain Descriptors / Indicators Aching    Pain Type Surgical pain    Pain Onset In the past 7 days    Pain Frequency Constant    Aggravating Factors  any movement, throbbing at rest    Pain Relieving Factors sling/pain meds; hasn't been using heat/ice  Effect of Pain on Daily Activities pain in LUE with most tasks;    Multiple Pain Sites Yes    Pain Score 3    Pain Location Arm    Pain Orientation Right    Pain Descriptors / Indicators Aching;Tightness    Pain Type Chronic pain    Pain Onset More than a month ago    Pain Frequency Constant    Aggravating Factors  spasticity in RUE    Pain Relieving Factors rest/gentle stretch    Effect of Pain on Daily Activities discomfort in RUE with daily tasks;                       INTERVENTIONS  Pt required min a to transition sit to supine with therapist helping support LUE to avoid AROM/muscle activation;  PT applied moist heat to left shoulder x5 min to promote relaxation and help with pain control (unbilled);    On plinth: Manual therapy -Towel roll underneath  patient's left shoulder as PT doffs sling: -Physical therapist performed PROM along with stretching at available end range x multiple reps 20-30 second bouts with flexion, abduction, and external rotation (0 degrees and 25 deg. abduction) per protocol.  PROM L elbow flexion/extension 20x   PROM BUE:   Left  (08/16/21) Left  (09/09/21)  Shoulder Flexion 62 80  Shoulder Abduction 51   ER 0 13 at 5 degrees abduction  IR 8  50 at 5 degrees abduction  Elbow extension -10 0  Elbow flexion  [facility]  [facility]    PT dons L shoulder brace    Seated: ice donned to L shoulder while assessing BP: BP assessed in RUE arm, normal cuff size, 159/92, HR 50    Pt educated throughout session about proper posture and technique with exercises. Improved exercise technique, movement at target joints, use of target muscles after min to mod verbal, visual, tactile cues.                      PT Education - 09/10/21 1255     Education Details positioning/ice/heat    Person(s) Educated Patient    Methods Explanation;Verbal cues    Comprehension Verbalized understanding;Returned demonstration;Verbal cues required;Need further instruction              PT Short Term Goals - 08/16/21 0934       PT SHORT TERM GOAL #1   Title Patient will be independent in home exercise program to improve strength/mobility for better functional independence with ADLs.    Baseline 4/24: HEP given    Time 4    Period Weeks    Status New    Target Date 09/13/21               PT Long Term Goals - 08/16/21 0935       PT LONG TERM GOAL #1   Title Patient will increase FOTO score to equal to or greater than     to demonstrate statistically significant improvement in mobility and quality of life.    Baseline 4/24: give next session    Time 12    Period Weeks    Status New    Target Date 11/08/21      PT LONG TERM GOAL #2   Title Patient will report a worst pain of 3/10 on VAS in  L shoulder to improve  tolerance with ADLs and reduced symptoms with activities.    Baseline 4/24: 10/10  Time 12    Period Weeks    Status New    Target Date 11/08/21      PT LONG TERM GOAL #3   Title Patient will improve shoulder AROM to > 140 degrees of flexion 12and abduction for improved ability to perform overhead activities.    Baseline 4/24: unable to perform AROM; PROM flexion 62, abduction 51    Time 12    Period Weeks    Status New    Target Date 11/08/21      PT LONG TERM GOAL #4   Title Patient will decrease Quick DASH score to >40% demonstrating reduced self-reported upper extremity disability.    Baseline 4/24: 88.6%    Time 12    Period Weeks    Status New    Target Date 11/08/21                   Plan - 09/10/21 1258     Clinical Impression Statement Patient motivated and participated well within session. Continued interventions to LUE per protocol. Patient is approximately 4 weeks post op. He reports increased LUE discomfort after last PT session, therefore PROM limited within tolerance. PT also educated patient in using heat/ice prn for pain management. Patient continues to exhibit bruising to LUE after surgery. He is limited in mobility as he is s/p CVA with right hemiplegia. Patient would benefit from additional skilled PT Intervention to improve ROM, strength and mobility; He is progressing in shoulder ROM compared to initial evaluation    Personal Factors and Comorbidities Comorbidity 3+;Fitness;Past/Current Experience;Transportation    Comorbidities CAD, NSTEMI, HTN, HLD, angina, hemiparesis s/p stroke, arthritis, insomnia, RLS    Examination-Activity Limitations Bathing;Bed Mobility;Bend;Caring for Others;Carry;Dressing;Hygiene/Grooming;Lift;Locomotion Level;Reach Overhead;Self Feeding;Sit;Transfers;Toileting;Stand    Examination-Participation Restrictions Cleaning;Community Activity;Interpersonal Relationship;Meal Prep;Medication Management;Laundry;Shop;Volunteer;Yard  Work    Conservation officer, historic buildingstability/Clinical Decision Making Evolving/Moderate complexity    Rehab Potential Fair    PT Frequency 2x / week    PT Duration 12 weeks    PT Treatment/Interventions ADLs/Self Care Home Management;Cryotherapy;Electrical Stimulation;Iontophoresis 4mg /ml Dexamethasone;Moist Heat;Traction;Ultrasound;Functional mobility training;Therapeutic activities;Therapeutic exercise;Balance training;Neuromuscular re-education;Manual techniques;Wheelchair mobility training;Patient/family education;Passive range of motion;Dry needling;Energy conservation;Splinting;Taping;Vasopneumatic Device;Vestibular;Visual/perceptual remediation/compensation;Spinal Manipulations;Joint Manipulations    PT Next Visit Plan follow protocol for PROM , do FOTO    PT Home Exercise Plan see above; no updates    Consulted and Agree with Plan of Care Patient             Patient will benefit from skilled therapeutic intervention in order to improve the following deficits and impairments:  Decreased coordination, Decreased mobility, Decreased range of motion, Decreased strength, Decreased scar mobility, Hypomobility, Impaired flexibility, Impaired perceived functional ability, Impaired tone, Impaired UE functional use, Postural dysfunction, Improper body mechanics, Pain  Visit Diagnosis: Stiffness of left shoulder, not elsewhere classified  Chronic left shoulder pain  Acute pain of left shoulder  Muscle weakness (generalized)     Problem List Patient Active Problem List   Diagnosis Date Noted   Coronary atherosclerosis of native coronary artery 11/04/2014   NSTEMI (non-ST elevated myocardial infarction) (HCC)    Coronary artery disease due to lipid rich plaque    Hyperlipidemia    Essential hypertension    Exertional angina (HCC) 09/08/2014   Unstable angina pectoris (HCC) 09/08/2014   Burning chest pain 08/22/2014   Other vitamin B12 deficiency anemia 07/11/2013   Hemiparesis and alteration of sensations  as late effects of stroke (HCC) 07/10/2013   Arthritis 06/25/2013   Insomnia, persistent 12/20/2012   Spastic hemiplegia  affecting dominant side (HCC) 12/07/2012   CVA (cerebral infarction) 10/11/2012   Unsteady gait 10/09/2012   HTN (hypertension) 05/31/2012   Family history of CVA 05/17/2012   History of renal stone 05/17/2012   RLS (restless legs syndrome) 01/06/2011   Allergic rhinitis due to pollen 01/06/2011   ED (erectile dysfunction) 01/06/2011   Family history of colon cancer 01/06/2011   Family history of heart disease in male family member before age 44 01/06/2011    Markham Dumlao, PT, DPT 09/10/2021, 1:00 PM  [facility] MAIN [facility] SERVICES [address] [place], [place], [address] Phone: [phone]   Fax:  [phone]  Name: Micheal Gonzalez MRN: [id number] Date of Birth: 1960/07/16

## 2021-09-13 ENCOUNTER — Ambulatory Visit: Payer: Medicare Other

## 2021-09-13 DIAGNOSIS — M25512 Pain in left shoulder: Secondary | ICD-10-CM

## 2021-09-13 DIAGNOSIS — M25612 Stiffness of left shoulder, not elsewhere classified: Secondary | ICD-10-CM

## 2021-09-13 DIAGNOSIS — M6281 Muscle weakness (generalized): Secondary | ICD-10-CM

## 2021-09-13 DIAGNOSIS — G8929 Other chronic pain: Secondary | ICD-10-CM

## 2021-09-13 NOTE — Therapy (Signed)
[place] [facility] [address] , [place], [address] Phone: [phone]   Fax:  [phone]  Physical Therapy Treatment  Patient Details  Name: Micheal LoaJeffrey A Mosquera MRN: [id number] Date of Birth: 1960/06/01 No data recorded  Encounter Date: 09/13/2021   PT End of Session - 09/13/21 1514     Visit Number 8    Number of Visits 24    Date for PT Re-Evaluation 11/08/21    Authorization Type 1/10 eval 08/16/21    PT Start Time 1515    PT Stop Time 1558    PT Time Calculation (min) 43 min    Equipment Utilized During Treatment Other (comment)   Left shoulder immobilizer brace   Activity Tolerance Patient tolerated treatment well;No increased pain    Behavior [facility] Therapy [facility] for tasks assessed/performed             Past Medical History:  Diagnosis Date   Allergy    Arthritis    right foot   Coronary artery disease    [facility] _ Cardiologist Dr. Raynelle JanMatthew Roe   ED (erectile dysfunction)    Exertional angina (HCC) 09/08/2014   Folliculitis barbae    Gait disorder    History of stroke    Right hemiparesis   Hypertension    Insomnia    Other vitamin B12 deficiency anemia 07/11/2013   RLS (restless legs syndrome)    Stroke [facility](HCC)    June 2014- partial paralysis on right side    Past Surgical History:  Procedure Laterality Date   CARDIAC CATHETERIZATION N/A 09/08/2014   Procedure: Left Heart Cath and Coronary Angiography;  Surgeon: Tonny BollmanMichael Cooper, MD;  [facility];  Service: Cardiovascular;  Laterality: N/A;   COLONOSCOPY  03/25/11   CORONARY ARTERY BYPASS GRAFT  09-15-14   Triple   TRIGGER FINGER RELEASE Left 12/09/2014   Procedure: RELEASE TRIGGER FINGER/A-1 PULLEY LEFT THUMB, INDEX, MIDDLE, RING FINGERS;  Surgeon: Cindee SaltGary Kuzma, MD;  Location: [facility];  Service: Orthopedics;  Laterality: Left;    There were no vitals filed for this visit.   Subjective Assessment -  09/13/21 1514     Subjective Patient reports ongoing left shoulder soreness 3/10 today and states continued compliance with brace wear.    Pertinent History Left rotator cuff tendon arthroscopy, tendonesis of long tendon of bicep, debridement. on 08/13/21. Patient has PMH of allergy, arthritis, CAD, ED, angina, gait disorder, stroke 2014 (R hemiparesis), HTN, insomnia, RLS, having a hard time getting out of bed.    Limitations Sitting;Lifting;Walking;House hold activities    How long can you sit comfortably? n/a    How long can you stand comfortably? n/a    How long can you walk comfortably? n/a    Patient Stated Goals to be able to use my L arm again    Currently in Pain? Yes    Pain Score 3     Pain Location Shoulder    Pain Orientation Left    Pain Descriptors / Indicators Aching;Sore;Tightness    Pain Type Surgical pain    Pain Onset 1 to 4 weeks ago    Pain Frequency Intermittent    Aggravating Factors  any movement, throbbing at rest    Pain Relieving Factors sling/pain meds- tylenol    Pain Onset --                INTERVENTIONS:     *Patient reports next MD  visit for follow up is 10/04/2021      Manual therapy:  -Towel roll underneath patient's left upper arm as PT doffs sling: -Physical therapist performed PROM along with stretching at available end range x multiple reps with flexion, abduction, and external rotation (0 degrees and approx 25 deg of Abd). Most limited with ER at approx 15 deg at best.  Grade 1-2 GH joint mobs (PA/AP) x 20 deg Very gentle distraction- GH (inferior) x 20 (no pain reported)    PROM L elbow flexion/extension 20x   PT dons L shoulder brace    Seated: ice donned to L shoulder in sitting position x 5 min (unbilled) at end of session.    Pt educated throughout session about proper posture and technique with exercises. Improved exercise technique, movement at target joints, use of target muscles after min to mod verbal, visual, tactile  cues.                       PT Education - 09/13/21 1557     Education Details Relaxation technqiue with PROM    Person(s) Educated Patient    Methods Explanation;Tactile cues;Demonstration;Verbal cues    Comprehension Verbalized understanding;Returned demonstration;Verbal cues required              PT Short Term Goals - 08/16/21 0934       PT SHORT TERM GOAL #1   Title Patient will be independent in home exercise program to improve strength/mobility for better functional independence with ADLs.    Baseline 4/24: HEP given    Time 4    Period Weeks    Status New    Target Date 09/13/21               PT Long Term Goals - 08/16/21 0935       PT LONG TERM GOAL #1   Title Patient will increase FOTO score to equal to or greater than     to demonstrate statistically significant improvement in mobility and quality of life.    Baseline 4/24: give next session    Time 12    Period Weeks    Status New    Target Date 11/08/21      PT LONG TERM GOAL #2   Title Patient will report a worst pain of 3/10 on VAS in  L shoulder to improve tolerance with ADLs and reduced symptoms with activities.    Baseline 4/24: 10/10    Time 12    Period Weeks    Status New    Target Date 11/08/21      PT LONG TERM GOAL #3   Title Patient will improve shoulder AROM to > 140 degrees of flexion 12and abduction for improved ability to perform overhead activities.    Baseline 4/24: unable to perform AROM; PROM flexion 62, abduction 51    Time 12    Period Weeks    Status New    Target Date 11/08/21      PT LONG TERM GOAL #4   Title Patient will decrease Quick DASH score to >40% demonstrating reduced self-reported upper extremity disability.    Baseline 4/24: 88.6%    Time 12    Period Weeks    Status New    Target Date 11/08/21                   Plan - 09/13/21 1514     Clinical Impression Statement Patient presents with good motivation  today for today's  session. He is still on PROM for his protocol this week and demonstrated good ability to relax during manual therapy today. He continues to report some lateral arm pain that intensifies with minimal Shoulder ER at varying angles of shoulder abd but no pain at rest. He performed well with PROM today otherwise. Patient would benefit from additional skilled PT Intervention to improve ROM, strength and mobility.    Personal Factors and Comorbidities Comorbidity 3+;Fitness;Past/Current Experience;Transportation    Comorbidities CAD, NSTEMI, HTN, HLD, angina, hemiparesis s/p stroke, arthritis, insomnia, RLS    Examination-Activity Limitations Bathing;Bed Mobility;Bend;Caring for Others;Carry;Dressing;Hygiene/Grooming;Lift;Locomotion Level;Reach Overhead;Self Feeding;Sit;Transfers;Toileting;Stand    Examination-Participation Restrictions Cleaning;Community Activity;Interpersonal Relationship;Meal Prep;Medication Management;Laundry;Shop;Volunteer;Yard Work    Conservation officer, historic buildings Evolving/Moderate complexity    Rehab Potential Fair    PT Frequency 2x / week    PT Duration 12 weeks    PT Treatment/Interventions ADLs/Self Care Home Management;Cryotherapy;Electrical Stimulation;Iontophoresis 4mg /ml Dexamethasone;Moist Heat;Traction;Ultrasound;Functional mobility training;Therapeutic activities;Therapeutic exercise;Balance training;Neuromuscular re-education;Manual techniques;Wheelchair mobility training;Patient/family education;Passive range of motion;Dry needling;Energy conservation;Splinting;Taping;Vasopneumatic Device;Vestibular;Visual/perceptual remediation/compensation;Spinal Manipulations;Joint Manipulations    PT Next Visit Plan follow protocol for PROM , do FOTO    PT Home Exercise Plan see above; no updates    Consulted and Agree with Plan of Care Patient             Patient will benefit from skilled therapeutic intervention in order to improve the following deficits and impairments:   Decreased coordination, Decreased mobility, Decreased range of motion, Decreased strength, Decreased scar mobility, Hypomobility, Impaired flexibility, Impaired perceived functional ability, Impaired tone, Impaired UE functional use, Postural dysfunction, Improper body mechanics, Pain  Visit Diagnosis: Stiffness of left shoulder, not elsewhere classified  Chronic left shoulder pain  Acute pain of left shoulder  Muscle weakness (generalized)     Problem List Patient Active Problem List   Diagnosis Date Noted   Coronary atherosclerosis of native coronary artery 11/04/2014   NSTEMI (non-ST elevated myocardial infarction) (HCC)    Coronary artery disease due to lipid rich plaque    Hyperlipidemia    Essential hypertension    Exertional angina (HCC) 09/08/2014   Unstable angina pectoris (HCC) 09/08/2014   Burning chest pain 08/22/2014   Other vitamin B12 deficiency anemia 07/11/2013   Hemiparesis and alteration of sensations as late effects of stroke (HCC) 07/10/2013   Arthritis 06/25/2013   Insomnia, persistent 12/20/2012   Spastic hemiplegia affecting dominant side (HCC) 12/07/2012   CVA (cerebral infarction) 10/11/2012   Unsteady gait 10/09/2012   HTN (hypertension) 05/31/2012   Family history of CVA 05/17/2012   History of renal stone 05/17/2012   RLS (restless legs syndrome) 01/06/2011   Allergic rhinitis due to pollen 01/06/2011   ED (erectile dysfunction) 01/06/2011   Family history of colon cancer 01/06/2011   Family history of heart disease in male family member before age 9 01/06/2011    01/08/2011, PT 09/13/2021, 5:04 PM  [place] [facility] MAIN [facility] SERVICES [address] [place], [place], [place] Phone: [phone]   Fax:  [phone]  Name: DUSTEN ELLINWOOD MRN: Micheal Gonzalez Date of Birth: Nov 17, 1960

## 2021-09-16 ENCOUNTER — Ambulatory Visit: Payer: Medicare Other | Admitting: Physical Therapy

## 2021-09-16 ENCOUNTER — Encounter: Payer: Self-pay | Admitting: Physical Therapy

## 2021-09-16 DIAGNOSIS — M6281 Muscle weakness (generalized): Secondary | ICD-10-CM

## 2021-09-16 DIAGNOSIS — G8929 Other chronic pain: Secondary | ICD-10-CM

## 2021-09-16 DIAGNOSIS — M25512 Pain in left shoulder: Secondary | ICD-10-CM

## 2021-09-16 DIAGNOSIS — M25612 Stiffness of left shoulder, not elsewhere classified: Secondary | ICD-10-CM | POA: Diagnosis not present

## 2021-09-16 NOTE — Therapy (Signed)
[place] [facility] MAIN [facility] SERVICES [address] [place], [place], [address] Phone: [phone]   Fax:  [phone]  Physical Therapy Treatment  Patient Details  Name: Micheal Gonzalez MRN: [id number] Date of Birth: [birth year] No data recorded  Encounter Date: 09/16/2021   PT End of Session - 09/16/21 1554     Visit Number 9    Number of Visits 24    Date for PT Re-Evaluation 11/08/21    Authorization Type 1/10 eval 08/16/21    PT Start Time 1430    PT Stop Time 1515    PT Time Calculation (min) 45 min    Equipment Utilized During Treatment Other (comment)   Left shoulder immobilizer brace   Activity Tolerance Patient tolerated treatment well;No increased pain    Behavior During Therapy [facility] for tasks assessed/performed             Past Medical History:  Diagnosis Date   Allergy    Arthritis    right foot   Coronary artery disease    Triple Bypass done at [facility] Cardiologist Dr. Raynelle Jan   ED (erectile dysfunction)    Exertional angina (HCC) 09/08/2014   Folliculitis barbae    Gait disorder    History of stroke    Right hemiparesis   Hypertension    Insomnia    Other vitamin B12 deficiency anemia 07/11/2013   RLS (restless legs syndrome)    Stroke [facility])    June 2014- partial paralysis on right side    Past Surgical History:  Procedure Laterality Date   CARDIAC CATHETERIZATION N/A 09/08/2014   Procedure: Left Heart Cath and Coronary Angiography;  Surgeon: Tonny Bollman, MD;  Location: [facility] INVASIVE CV LAB;  Service: Cardiovascular;  Laterality: N/A;   COLONOSCOPY  03/25/11   CORONARY ARTERY BYPASS GRAFT  09-15-14   Triple   TRIGGER FINGER RELEASE Left 12/09/2014   Procedure: RELEASE TRIGGER FINGER/A-1 PULLEY LEFT THUMB, INDEX, MIDDLE, RING FINGERS;  Surgeon: Cindee Salt, MD;  Location: [facility];  Service: Orthopedics;  Laterality: Left;    There were no vitals filed for this visit.   Subjective Assessment -  09/16/21 1553     Subjective Patient reports improved shoulder pain over the past 2 days. No new compalints at this time. Pt reports his noext MD appointment in 6/12    Pertinent History Left rotator cuff tendon arthroscopy, tendonesis of long tendon of bicep, debridement. on 08/13/21. Patient has PMH of allergy, arthritis, CAD, ED, angina, gait disorder, stroke 2014 (R hemiparesis), HTN, insomnia, RLS, having a hard time getting out of bed.    Limitations Sitting;Lifting;Walking;House hold activities    How long can you sit comfortably? n/a    How long can you stand comfortably? n/a    How long can you walk comfortably? n/a    Patient Stated Goals to be able to use my L arm again    Currently in Pain? No/denies    Pain Onset 1 to 4 weeks ago                INTERVENTIONS:     *Patient reports next MD visit for follow up is 10/04/2021      Manual therapy:  -Towel roll underneath patient's left upper arm as PT doffs sling: -Physical therapist performed PROM along with stretching at available end range x multiple reps with flexion, abduction, and external rotation (0 degrees and approx 25 deg of Abd). Most limited with ER  at 10-15 degrees at most  Grade 1-2 GH joint mobs (PA/AP) x  Very gentle distraction- GH (inferior)    PROM L elbow flexion/extension 20x  Therex  Table slide anterior 10 x 5 sec hold into flexion   Table slide scaption 10 x 5 sec hold  -cues for both tableside exercises to minimize shoulder active movement and maximize passive movement from trunk flexion movement.   PT dons L shoulder brace    Seated: ice donned to L shoulder in sitting position x 5 min (unbilled) at end of session.    Pt educated throughout session about proper posture and technique with exercises. Improved exercise technique, movement at target joints, use of target muscles after min to mod verbal, visual, tactile cues.                                PT  Education - 09/16/21 1553     Education Details Table slides as HEP addition    Person(s) Educated Patient    Methods Explanation;Demonstration;Handout    Comprehension Verbalized understanding;Returned demonstration;Verbal cues required              PT Short Term Goals - 08/16/21 0934       PT SHORT TERM GOAL #1   Title Patient will be independent in home exercise program to improve strength/mobility for better functional independence with ADLs.    Baseline 4/24: HEP given    Time 4    Period Weeks    Status New    Target Date 09/13/21               PT Long Term Goals - 08/16/21 0935       PT LONG TERM GOAL #1   Title Patient will increase FOTO score to equal to or greater than     to demonstrate statistically significant improvement in mobility and quality of life.    Baseline 4/24: give next session    Time 12    Period Weeks    Status New    Target Date 11/08/21      PT LONG TERM GOAL #2   Title Patient will report a worst pain of 3/10 on VAS in  L shoulder to improve tolerance with ADLs and reduced symptoms with activities.    Baseline 4/24: 10/10    Time 12    Period Weeks    Status New    Target Date 11/08/21      PT LONG TERM GOAL #3   Title Patient will improve shoulder AROM to > 140 degrees of flexion 12and abduction for improved ability to perform overhead activities.    Baseline 4/24: unable to perform AROM; PROM flexion 62, abduction 51    Time 12    Period Weeks    Status New    Target Date 11/08/21      PT LONG TERM GOAL #4   Title Patient will decrease Quick DASH score to >40% demonstrating reduced self-reported upper extremity disability.    Baseline 4/24: 88.6%    Time 12    Period Weeks    Status New    Target Date 11/08/21                   Plan - 09/16/21 1554     Clinical Impression Statement Patient presents with good motivation for completion of physical therapy activities.  Patient continues to have significant  difficulty  with external rotation passive range of motion continues to have tightness noted along his biceps muscle with this activity.  Patient shoulder elevation continues to improve patient demonstrates good shoulder flexion and scaption range of motion during physical therapy session this date.  Patient tolerates the addition of table slides for passive range of motion of his involved upper extremity.  Patient will continue to benefit from skilled physical therapy to improve his range of motion, strength, and mobility.    Personal Factors and Comorbidities Comorbidity 3+;Fitness;Past/Current Experience;Transportation    Comorbidities CAD, NSTEMI, HTN, HLD, angina, hemiparesis s/p stroke, arthritis, insomnia, RLS    Examination-Activity Limitations Bathing;Bed Mobility;Bend;Caring for Others;Carry;Dressing;Hygiene/Grooming;Lift;Locomotion Level;Reach Overhead;Self Feeding;Sit;Transfers;Toileting;Stand    Examination-Participation Restrictions Cleaning;Community Activity;Interpersonal Relationship;Meal Prep;Medication Management;Laundry;Shop;Volunteer;Yard Work    Conservation officer, historic buildings Evolving/Moderate complexity    Rehab Potential Fair    PT Frequency 2x / week    PT Duration 12 weeks    PT Treatment/Interventions ADLs/Self Care Home Management;Cryotherapy;Electrical Stimulation;Iontophoresis 4mg /ml Dexamethasone;Moist Heat;Traction;Ultrasound;Functional mobility training;Therapeutic activities;Therapeutic exercise;Balance training;Neuromuscular re-education;Manual techniques;Wheelchair mobility training;Patient/family education;Passive range of motion;Dry needling;Energy conservation;Splinting;Taping;Vasopneumatic Device;Vestibular;Visual/perceptual remediation/compensation;Spinal Manipulations;Joint Manipulations    PT Next Visit Plan follow protocol for PROM , do FOTO    PT Home Exercise Plan see above; no updates    Consulted and Agree with Plan of Care Patient              Patient will benefit from skilled therapeutic intervention in order to improve the following deficits and impairments:  Decreased coordination, Decreased mobility, Decreased range of motion, Decreased strength, Decreased scar mobility, Hypomobility, Impaired flexibility, Impaired perceived functional ability, Impaired tone, Impaired UE functional use, Postural dysfunction, Improper body mechanics, Pain  Visit Diagnosis: Stiffness of left shoulder, not elsewhere classified  Chronic left shoulder pain  Acute pain of left shoulder  Muscle weakness (generalized)     Problem List Patient Active Problem List   Diagnosis Date Noted   Coronary atherosclerosis of native coronary artery 11/04/2014   NSTEMI (non-ST elevated myocardial infarction) (HCC)    Coronary artery disease due to lipid rich plaque    Hyperlipidemia    Essential hypertension    Exertional angina (HCC) 09/08/2014   Unstable angina pectoris (HCC) 09/08/2014   Burning chest pain 08/22/2014   Other vitamin B12 deficiency anemia 07/11/2013   Hemiparesis and alteration of sensations as late effects of stroke (HCC) 07/10/2013   Arthritis 06/25/2013   Insomnia, persistent 12/20/2012   Spastic hemiplegia affecting dominant side (HCC) 12/07/2012   CVA (cerebral infarction) 10/11/2012   Unsteady gait 10/09/2012   HTN (hypertension) 05/31/2012   Family history of CVA 05/17/2012   History of renal stone 05/17/2012   RLS (restless legs syndrome) 01/06/2011   Allergic rhinitis due to pollen 01/06/2011   ED (erectile dysfunction) 01/06/2011   Family history of colon cancer 01/06/2011   Family history of heart disease in male family member before age 42 01/06/2011    01/08/2011, PT 09/16/2021, 3:58 PM  [place] [facility] MAIN [facility] SERVICES [address] [place], [place], [place] Phone: [phone]   Fax:  [phone]  Name: Micheal Gonzalez MRN: [name] Date of Birth:  1960/10/23

## 2021-09-21 ENCOUNTER — Ambulatory Visit: Payer: Medicare Other | Admitting: Physical Therapy

## 2021-09-21 DIAGNOSIS — M6281 Muscle weakness (generalized): Secondary | ICD-10-CM

## 2021-09-21 DIAGNOSIS — M25612 Stiffness of left shoulder, not elsewhere classified: Secondary | ICD-10-CM

## 2021-09-21 DIAGNOSIS — G8929 Other chronic pain: Secondary | ICD-10-CM

## 2021-09-21 DIAGNOSIS — M25512 Pain in left shoulder: Secondary | ICD-10-CM

## 2021-09-21 NOTE — Therapy (Signed)
[place] MAIN [facility] SERVICES [address] [place], [place], [address] Phone: [phone]   Fax:  [phone]  Physical Therapy Treatment/ Physical Therapy Progress Note   Dates of reporting period  08/16/21   to   09/21/21   Patient Details  Name: Micheal Gonzalez MRN: [id number] Date of Birth: 1960/10/30 No data recorded  Encounter Date: 09/21/2021   PT End of Session - 09/21/21 1337     Visit Number 10    Number of Visits 24    Date for PT Re-Evaluation 11/08/21    Authorization Type 1/10 eval 08/16/21    PT Start Time 1302    PT Stop Time 1345    PT Time Calculation (min) 43 min    Equipment Utilized During Treatment Other (comment)   Left shoulder immobilizer brace   Activity Tolerance Patient tolerated treatment well;No increased pain    Behavior During Therapy [facility] for tasks assessed/performed             Past Medical History:  Diagnosis Date   Allergy    Arthritis    right foot   Coronary artery disease    Triple Bypass done at [facility] Cardiologist Dr. Concha Pyo   ED (erectile dysfunction)    Exertional angina ([place]) 05/09/7260   Folliculitis barbae    Gait disorder    History of stroke    Right hemiparesis   Hypertension    Insomnia    Other vitamin B12 deficiency anemia 07/11/2013   RLS (restless legs syndrome)    Stroke [facility])    June 2014- partial paralysis on right side    Past Surgical History:  Procedure Laterality Date   CARDIAC CATHETERIZATION N/A 09/08/2014   Procedure: Left Heart Cath and Coronary Angiography;  Surgeon: Sherren Mocha, MD;  Location: [place] CV LAB;  Service: Cardiovascular;  Laterality: N/A;   COLONOSCOPY  03/25/11   CORONARY ARTERY BYPASS GRAFT  09-15-14   Triple   TRIGGER FINGER RELEASE Left 12/09/2014   Procedure: RELEASE TRIGGER FINGER/A-1 PULLEY LEFT THUMB, INDEX, MIDDLE, RING FINGERS;  Surgeon: Daryll Brod, MD;  Location: [place];  Service: Orthopedics;   Laterality: Left;    There were no vitals filed for this visit.   Subjective Assessment - 09/21/21 1317     Subjective Pt reports no new pain or changes since last session. Reports he would like to get back to completing dry needling for his hand with OT but was instructed his MD would like him to continue with pt 2 x per week at this time.    Pertinent History Left rotator cuff tendon arthroscopy, tendonesis of long tendon of bicep, debridement. on 08/13/21. Patient has PMH of allergy, arthritis, CAD, ED, angina, gait disorder, stroke 2014 (R hemiparesis), HTN, insomnia, RLS, having a hard time getting out of bed.    Limitations Sitting;Lifting;Walking;House hold activities    How long can you sit comfortably? n/a    How long can you stand comfortably? n/a    How long can you walk comfortably? n/a    Patient Stated Goals to be able to use my L arm again    Pain Onset 1 to 4 weeks ago                 Manual therapy:  -Towel roll underneath patient's left upper arm as PT doffs sling: -Physical therapist performed PROM along with stretching at available end range x multiple reps with flexion, abduction, and  external rotation (0 degrees and approx 25 deg of Abd). Most limited with ER at 10-15 degrees at most  -slight but not measurable improvement in ER with elbow extended but due to inability to accurately determine axes with elbow straight unable to determine if true improvement in ROM, did allow end range ER without pain.  Grade 1-2 GH joint mobs (PA/AP) x multiple reps Very gentle distraction- GH (inferior) x multiple reps   Soft tissue mobilization to left anterior and lateral deltoid muscle along areas of significant trigger points which patient reports are painful during external rotation passive movement.  Perform myofascial release and cross friction massage cross-section massage  Therex  Table slide anterior 10 x 5 sec hold into flexion   Table slide scaption 10 x 5 sec  hold  -cues for both tableside exercises to minimize shoulder active movement and maximize passive movement from trunk flexion movement.   PT dons L shoulder brace    Seated: ice donned to L shoulder in sitting position x 5 min (unbilled) at end of session.    Pt educated throughout session about proper posture and technique with exercises. Improved exercise technique, movement at target joints, use of target muscles after min to mod verbal, visual, tactile cues.   Quick DASH: 75                          PT Education - 09/21/21 1337     Education Details exercise technique    Person(s) Educated Patient    Methods Explanation;Demonstration    Comprehension Verbalized understanding;Returned demonstration              PT Short Term Goals - 09/21/21 1337       PT SHORT TERM GOAL #1   Title Patient will be independent in home exercise program to improve strength/mobility for better functional independence with ADLs.    Baseline 4/24: HEP given 5/30: compelting HEP regularly    Time 4    Period Weeks    Status Achieved    Target Date 09/13/21               PT Long Term Goals - 09/21/21 1339       PT LONG TERM GOAL #1   Title Patient will increase FOTO score to equal to or greater than   60  to demonstrate statistically significant improvement in mobility and quality of life.    Baseline 4/24: give next session 5/30:38    Time 12    Period Weeks    Status New    Target Date 11/08/21      PT LONG TERM GOAL #2   Title Patient will report a worst pain of 3/10 on VAS in  L shoulder to improve tolerance with ADLs and reduced symptoms with activities.    Baseline 4/24: 10/10 5/30: 3/10 at worst in past few daysworse pain about 2 weeks ago    Time 12    Period Weeks    Status Partially Met    Target Date 11/08/21      PT LONG TERM GOAL #3   Title Patient will improve shoulder AROM to > 140 degrees of flexion 12and abduction for improved ability  to perform overhead activities.    Baseline 4/24: unable to perform AROM; PROM flexion 102, abduction 95, ER: 15 degrees    Time 12    Period Weeks    Status On-going    Target Date 11/08/21  PT LONG TERM GOAL #4   Title Patient will decrease Quick DASH score to >40% demonstrating reduced self-reported upper extremity disability.    Baseline 4/24: 88.6% 5/30:75    Time 12    Period Weeks    Status New    Target Date 11/08/21                   Plan - 09/21/21 1721     Clinical Impression Statement Patient presents to physical therapy for progress note this session.  Patient has continued with protocol as prescribed from surgeon.  Patient continues to progress with overhead movements including improved range of motion with flexion scaption and abduction but does continue to have significant limitation in external rotation.  Patient still unable to achieve greater than 15 degrees of external rotation without pain and discomfort along the area of his biceps.  He did demonstrate some progress with range of motion with elbow extended but was unable to measure due to inability to limit any operating proper positioning at this level.  Patient continues to have significant trigger points and tender to palpation areas along the area of his biceps and deltoid musculature with increased tenderness to palpation along his left lateral and anterior deltoid.  Patient has made progress with his QuickDASH score indicating subjective improvement in his shoulder disability, however, patient is still wearing sling throughout the day per advice from MD this may be impacting his scores on this test.  Patient will continue to benefit from skilled physical therapy in order to improve his range of motion, strength, and overall shoulder function. Patient's condition has the potential to improve in response to therapy. Maximum improvement is yet to be obtained. The anticipated improvement is attainable and  reasonable in a generally predictable time.      Personal Factors and Comorbidities Comorbidity 3+;Fitness;Past/Current Experience;Transportation    Comorbidities CAD, NSTEMI, HTN, HLD, angina, hemiparesis s/p stroke, arthritis, insomnia, RLS    Examination-Activity Limitations Bathing;Bed Mobility;Bend;Caring for Others;Carry;Dressing;Hygiene/Grooming;Lift;Locomotion Level;Reach Overhead;Self Feeding;Sit;Transfers;Toileting;Stand    Examination-Participation Restrictions Cleaning;Community Activity;Interpersonal Relationship;Meal Prep;Medication Management;Laundry;Shop;Volunteer;Yard Work    [employer] officer Evolving/Moderate complexity    Rehab Potential Fair    PT Frequency 2x / week    PT Duration 12 weeks    PT Treatment/Interventions ADLs/Self Care Home Management;Cryotherapy;Electrical Stimulation;Iontophoresis 63m/ml Dexamethasone;Moist Heat;Traction;Ultrasound;Functional mobility training;Therapeutic activities;Therapeutic exercise;Balance training;Neuromuscular re-education;Manual techniques;Wheelchair mobility training;Patient/family education;Passive range of motion;Dry needling;Energy conservation;Splinting;Taping;Vasopneumatic Device;Vestibular;Visual/perceptual remediation/compensation;Spinal Manipulations;Joint Manipulations    PT Next Visit Plan follow protocol for PROM , do FOTO    PT Home Exercise Plan see above; no updates    Consulted and Agree with Plan of Care Patient             Patient will benefit from skilled therapeutic intervention in order to improve the following deficits and impairments:  Decreased coordination, Decreased mobility, Decreased range of motion, Decreased strength, Decreased scar mobility, Hypomobility, Impaired flexibility, Impaired perceived functional ability, Impaired tone, Impaired UE functional use, Postural dysfunction, Improper body mechanics, Pain  Visit Diagnosis: Stiffness of left shoulder, not elsewhere  classified  Chronic left shoulder pain  Acute pain of left shoulder  Muscle weakness (generalized)     Problem List Patient Active Problem List   Diagnosis Date Noted   Coronary atherosclerosis of native coronary artery 11/04/2014   NSTEMI (non-ST elevated myocardial infarction) ([place]    Coronary artery disease due to lipid rich plaque    Hyperlipidemia    Essential hypertension    Exertional angina ([place] 09/08/2014  Unstable angina pectoris ([place]) 09/08/2014   Burning chest pain 08/22/2014   Other vitamin B12 deficiency anemia 07/11/2013   Hemiparesis and alteration of sensations as late effects of stroke ([place]) 07/10/2013   Arthritis 06/25/2013   Insomnia, persistent 12/20/2012   Spastic hemiplegia affecting dominant side ([place]) 12/07/2012   CVA (cerebral infarction) 10/11/2012   Unsteady gait 10/09/2012   HTN (hypertension) 05/31/2012   Family history of CVA 05/17/2012   History of renal stone 05/17/2012   RLS (restless legs syndrome) 01/06/2011   Allergic rhinitis due to pollen 01/06/2011   ED (erectile dysfunction) 01/06/2011   Family history of colon cancer 01/06/2011   Family history of heart disease in male family member before age 32 01/06/2011    Particia Lather, PT 09/21/2021, 5:26 PM  [place] [address] [place], [place], [address] Phone: [phone]   Fax:  [phone]  Name: Micheal Gonzalez MRN: [id number] Date of Birth: 1960-11-27

## 2021-09-22 ENCOUNTER — Ambulatory Visit: Payer: Medicare Other

## 2021-09-23 ENCOUNTER — Ambulatory Visit: Payer: Medicare Other | Attending: Orthopaedic Surgery | Admitting: Physical Therapy

## 2021-09-23 ENCOUNTER — Encounter: Payer: Self-pay | Admitting: Physical Therapy

## 2021-09-23 DIAGNOSIS — M79601 Pain in right arm: Secondary | ICD-10-CM | POA: Diagnosis present

## 2021-09-23 DIAGNOSIS — R278 Other lack of coordination: Secondary | ICD-10-CM | POA: Insufficient documentation

## 2021-09-23 DIAGNOSIS — M6281 Muscle weakness (generalized): Secondary | ICD-10-CM | POA: Insufficient documentation

## 2021-09-23 DIAGNOSIS — G8929 Other chronic pain: Secondary | ICD-10-CM | POA: Insufficient documentation

## 2021-09-23 DIAGNOSIS — M25612 Stiffness of left shoulder, not elsewhere classified: Secondary | ICD-10-CM | POA: Diagnosis present

## 2021-09-23 DIAGNOSIS — M25512 Pain in left shoulder: Secondary | ICD-10-CM | POA: Insufficient documentation

## 2021-09-23 DIAGNOSIS — M79641 Pain in right hand: Secondary | ICD-10-CM | POA: Diagnosis present

## 2021-09-23 NOTE — Therapy (Signed)
[place] MAIN [facility] SERVICES [address] [place], [place], [address] Phone: [phone]   Fax:  [phone]  Physical Therapy Treatment  Patient Details  Name: Micheal Gonzalez MRN: [id number] Date of Birth: [birth year] No data recorded  Encounter Date: 09/23/2021   PT End of Session - 09/23/21 1506     Visit Number 11    Number of Visits 24    Date for PT Re-Evaluation 11/08/21    Authorization Type 1/10 eval 08/16/21    PT Start Time 1430    PT Stop Time 1515    PT Time Calculation (min) 45 min    Equipment Utilized During Treatment Other (comment)   Left shoulder immobilizer brace   Activity Tolerance Patient tolerated treatment well;No increased pain    Behavior During Therapy [facility] for tasks assessed/performed             Past Medical History:  Diagnosis Date   Allergy    Arthritis    right foot   Coronary artery disease    Triple Bypass done at [facility] Cardiologist Dr. Concha Pyo   ED (erectile dysfunction)    Exertional angina ([place]) 0/86/7619   Folliculitis barbae    Gait disorder    History of stroke    Right hemiparesis   Hypertension    Insomnia    Other vitamin B12 deficiency anemia 07/11/2013   RLS (restless legs syndrome)    Stroke [facility])    June 2014- partial paralysis on right side    Past Surgical History:  Procedure Laterality Date   CARDIAC CATHETERIZATION N/A 09/08/2014   Procedure: Left Heart Cath and Coronary Angiography;  Surgeon: Sherren Mocha, MD;  Location: [place] CV LAB;  Service: Cardiovascular;  Laterality: N/A;   COLONOSCOPY  03/25/11   CORONARY ARTERY BYPASS GRAFT  09-15-14   Triple   TRIGGER FINGER RELEASE Left 12/09/2014   Procedure: RELEASE TRIGGER FINGER/A-1 PULLEY LEFT THUMB, INDEX, MIDDLE, RING FINGERS;  Surgeon: Daryll Brod, MD;  Location: [place];  Service: Orthopedics;  Laterality: Left;    There were no vitals filed for this visit.   Subjective Assessment -  09/23/21 1505     Subjective Pt reports no new pain or changes since last session.    Pertinent History Left rotator cuff tendon arthroscopy, tendonesis of long tendon of bicep, debridement. on 08/13/21. Patient has PMH of allergy, arthritis, CAD, ED, angina, gait disorder, stroke 2014 (R hemiparesis), HTN, insomnia, RLS, having a hard time getting out of bed.    Limitations Sitting;Lifting;Walking;House hold activities    How long can you sit comfortably? n/a    How long can you stand comfortably? n/a    How long can you walk comfortably? n/a    Patient Stated Goals to be able to use my L arm again    Currently in Pain? No/denies    Pain Onset 1 to 4 weeks ago                  Manual therapy:  -Towel roll underneath patient's left upper arm as PT doffs sling: -Physical therapist performed PROM along with stretching at available end range x multiple reps with flexion, abduction, and external rotation (0 degrees and approx 50 deg of Abd). Most limited with ER but improved at increased abduction RO Grade 1-2 GH joint mobs (PA/AP) x multiple reps Gentle distraction- GH (inferior) x multiple reps  Soft tissue mobilization to left anterior and lateral deltoid  muscle along areas of significant trigger points which patient reports are painful during external rotation passive movement.   Pt also has significant trigger points in subscapularis and shoulder extensor musculature and pec minor near insertion.  Perform myofascial release and cross friction massage cross-section massage  Therex  Table ER stretch 3 x 30 sec holds   Table slide anterior 10 x 5 sec hold into flexion   Table slide scaption 10 x 5 sec hold  -cues for both tableside exercises to minimize shoulder active movement and maximize passive movement from trunk flexion movement.   PT dons L shoulder brace    Seated: ice donned to L shoulder in sitting position x 5 min (unbilled) at end of session.    Pt educated  throughout session about proper posture and technique with exercises. Improved exercise technique, movement at target joints, use of target muscles after min to mod verbal, visual, tactile cues.  Rationale for Evaluation and Treatment Rehabilitation                         PT Education - 09/23/21 1505     Education Details ER table stretch technique    Person(s) Educated Patient    Methods Explanation;Demonstration    Comprehension Verbalized understanding;Returned demonstration;Verbal cues required              PT Short Term Goals - 09/21/21 1337       PT SHORT TERM GOAL #1   Title Patient will be independent in home exercise program to improve strength/mobility for better functional independence with ADLs.    Baseline 4/24: HEP given 5/30: compelting HEP regularly    Time 4    Period Weeks    Status Achieved    Target Date 09/13/21               PT Long Term Goals - 09/21/21 1339       PT LONG TERM GOAL #1   Title Patient will increase FOTO score to equal to or greater than   60  to demonstrate statistically significant improvement in mobility and quality of life.    Baseline 4/24: give next session 5/30:38    Time 12    Period Weeks    Status New    Target Date 11/08/21      PT LONG TERM GOAL #2   Title Patient will report a worst pain of 3/10 on VAS in  L shoulder to improve tolerance with ADLs and reduced symptoms with activities.    Baseline 4/24: 10/10 5/30: 3/10 at worst in past few daysworse pain about 2 weeks ago    Time 12    Period Weeks    Status Partially Met    Target Date 11/08/21      PT LONG TERM GOAL #3   Title Patient will improve shoulder AROM to > 140 degrees of flexion 12and abduction for improved ability to perform overhead activities.    Baseline 4/24: unable to perform AROM; PROM flexion 102, abduction 95, ER: 15 degrees    Time 12    Period Weeks    Status On-going    Target Date 11/08/21      PT LONG TERM  GOAL #4   Title Patient will decrease Quick DASH score to >40% demonstrating reduced self-reported upper extremity disability.    Baseline 4/24: 88.6% 5/30:75    Time 12    Period Weeks    Status New  Target Date 11/08/21                   Plan - 09/23/21 1507     Clinical Impression Statement Pt presents with good motivation for copmletion of PT activities. Pt progressed with ER ROM this session and was able to obtain 30 degrees ER with choulder in approximately 70 degrees of ABD. Pt also introduced to table ER stretch in order to continue to work on this limited motion as part of his HEP. Pt will conitnue to benefit from skilled PT in order to improve his ROM, strength and QOL.    Personal Factors and Comorbidities Comorbidity 3+;Fitness;Past/Current Experience;Transportation    Comorbidities CAD, NSTEMI, HTN, HLD, angina, hemiparesis s/p stroke, arthritis, insomnia, RLS    Examination-Activity Limitations Bathing;Bed Mobility;Bend;Caring for Others;Carry;Dressing;Hygiene/Grooming;Lift;Locomotion Level;Reach Overhead;Self Feeding;Sit;Transfers;Toileting;Stand    Examination-Participation Restrictions Cleaning;Community Activity;Interpersonal Relationship;Meal Prep;Medication Management;Laundry;Shop;Volunteer;Yard Work    [employer]chant [employer] officer Evolving/Moderate complexity    Rehab Potential Fair    PT Frequency 2x / week    PT Duration 12 weeks    PT Treatment/Interventions ADLs/Self Care Home Management;Cryotherapy;Electrical Stimulation;Iontophoresis 54m/ml Dexamethasone;Moist Heat;Traction;Ultrasound;Functional mobility training;Therapeutic activities;Therapeutic exercise;Balance training;Neuromuscular re-education;Manual techniques;Wheelchair mobility training;Patient/family education;Passive range of motion;Dry needling;Energy conservation;Splinting;Taping;Vasopneumatic Device;Vestibular;Visual/perceptual remediation/compensation;Spinal Manipulations;Joint  Manipulations    PT Next Visit Plan follow protocol for PROM , do FOTO    PT Home Exercise Plan see above; no updates    Consulted and Agree with Plan of Care Patient             Patient will benefit from skilled therapeutic intervention in order to improve the following deficits and impairments:  Decreased coordination, Decreased mobility, Decreased range of motion, Decreased strength, Decreased scar mobility, Hypomobility, Impaired flexibility, Impaired perceived functional ability, Impaired tone, Impaired UE functional use, Postural dysfunction, Improper body mechanics, Pain  Visit Diagnosis: Stiffness of left shoulder, not elsewhere classified  Chronic left shoulder pain  Muscle weakness (generalized)     Problem List Patient Active Problem List   Diagnosis Date Noted   Coronary atherosclerosis of native coronary artery 11/04/2014   NSTEMI (non-ST elevated myocardial infarction) ([place]    Coronary artery disease due to lipid rich plaque    Hyperlipidemia    Essential hypertension    [place]ional angina ([place] 09/08/2014   Unstable angina pectoris ([place] 09/08/2014   Burning chest pain 08/22/2014   Other vitamin B12 deficiency anemia 07/11/2013   Hemiparesis and alteration of sensations as late effects of stroke ([place] 07/10/2013   Arthritis 06/25/2013   Insomnia, persistent 12/20/2012   Spastic hemiplegia affecting dominant side ([place] 12/07/2012   CVA (cerebral infarction) 10/11/2012   Unsteady gait 10/09/2012   HTN (hypertension) 05/31/2012   Family history of CVA 05/17/2012   History of renal stone 05/17/2012   RLS (restless legs syndrome) 01/06/2011   Allergic rhinitis due to pollen 01/06/2011   ED (erectile dysfunction) 01/06/2011   Family history of colon cancer 01/06/2011   Family history of heart disease in male family member before age 973009/13/2012    CParticia Lather PT 09/23/2021, 4:42 PM  [place] [place] [address]Phone: [phone]  Fax:  [phone] Name: JKHALE NIGHMRN: [id number] of Birth: [birth year]

## 2021-09-28 ENCOUNTER — Ambulatory Visit: Payer: Medicare Other

## 2021-09-28 DIAGNOSIS — M6281 Muscle weakness (generalized): Secondary | ICD-10-CM

## 2021-09-28 DIAGNOSIS — M25612 Stiffness of left shoulder, not elsewhere classified: Secondary | ICD-10-CM

## 2021-09-28 DIAGNOSIS — M25512 Pain in left shoulder: Secondary | ICD-10-CM

## 2021-09-28 NOTE — Therapy (Signed)
[place] MAIN [facility] SERVICES [address] [place], [place], [address] Phone: [phone]   Fax:  [phone]  Physical Therapy Treatment  Patient Details  Name: Micheal Gonzalez MRN: [id number] Date of Birth: 15-Nov-1960 No data recorded  Encounter Date: 09/28/2021   PT End of Session - 09/28/21 1625     Visit Number 12    Number of Visits 24    Date for PT Re-Evaluation 11/08/21    Authorization Type 1/10 eval 08/16/21    PT Start Time 1518    PT Stop Time 1605    PT Time Calculation (min) 47 min    Equipment Utilized During Treatment Other (comment)   Left shoulder immobilizer brace   Activity Tolerance Patient tolerated treatment well;No increased pain    Behavior During Therapy [facility] for tasks assessed/performed             Past Medical History:  Diagnosis Date   Allergy    Arthritis    right foot   Coronary artery disease    Triple Bypass done at [facility] Cardiologist Dr. Concha Pyo   ED (erectile dysfunction)    Exertional angina ([place]) 0/93/8182   Folliculitis barbae    Gait disorder    History of stroke    Right hemiparesis   Hypertension    Insomnia    Other vitamin B12 deficiency anemia 07/11/2013   RLS (restless legs syndrome)    Stroke [facility])    June 2014- partial paralysis on right side    Past Surgical History:  Procedure Laterality Date   CARDIAC CATHETERIZATION N/A 09/08/2014   Procedure: Left Heart Cath and Coronary Angiography;  Surgeon: Sherren Mocha, MD;  Location: [place] CV LAB;  Service: Cardiovascular;  Laterality: N/A;   COLONOSCOPY  03/25/11   CORONARY ARTERY BYPASS GRAFT  09-15-14   Triple   TRIGGER FINGER RELEASE Left 12/09/2014   Procedure: RELEASE TRIGGER FINGER/A-1 PULLEY LEFT THUMB, INDEX, MIDDLE, RING FINGERS;  Surgeon: Daryll Brod, MD;  Location: [place];  Service: Orthopedics;  Laterality: Left;    There were no vitals filed for this visit.   Subjective Assessment -  09/28/21 1622     Subjective Pt states he is doing well- continuing to deny pain. States he returns to MD on 10/04/2021 for next Ortho follow up.    Pertinent History Left rotator cuff tendon arthroscopy, tendonesis of long tendon of bicep, debridement. on 08/13/21. Patient has PMH of allergy, arthritis, CAD, ED, angina, gait disorder, stroke 2014 (R hemiparesis), HTN, insomnia, RLS, having a hard time getting out of bed.    Limitations Sitting;Lifting;Walking;House hold activities    How long can you sit comfortably? n/a    How long can you stand comfortably? n/a    How long can you walk comfortably? n/a    Patient Stated Goals to be able to use my L arm again    Currently in Pain? No/denies    Pain Onset 1 to 4 weeks ago             INTERVENTIONS:    Manual therapy:  - PROM to left shoulder in supine - Flex/scap/abd/ER/elbow flex Manual stretching at  end range x multiple reps with flexion, abduction, and external rotation (0 degrees and approx 50 deg of Abd). Most limited with ER but improved at increased abduction RO Grade 2-3 GH joint mobs (PA/AP) x multiple reps Gentle distraction- GH (inferior) x multiple reps  Soft tissue mobilization to  left anterior and lateral deltoid muscle and pectoral insertion. .   Therex:  Passive self ROM using tray table and pillow case/towel roll - Instruction to lay arm and leave on table while flexing trunk forward til desired stretch:   Table ER stretch 3 x 30 sec holds    Table slide anterior 10 x 5 sec hold into flexion    Table slide scaption 10 x 5 sec hold  -cues for both tableside exercises to minimize shoulder active movement and maximize passive movement from trunk flexion movement.      Seated: ice donned to L shoulder in sitting position x 5 min (unbilled) at end of session.    PT dons L shoulder brace   Pt educated throughout session about proper posture and technique with exercises. Improved exercise technique, movement at  target joints, use of target muscles after min to mod verbal, visual, tactile cues.   Rationale for Evaluation and Treatment Rehabilitation                        PT Education - 09/28/21 1625     Education Details Exercise technique    Person(s) Educated Patient    Methods Explanation;Demonstration;Tactile cues;Verbal cues    Comprehension Verbalized understanding;Returned demonstration;Verbal cues required;Tactile cues required;Need further instruction              PT Short Term Goals - 09/21/21 1337       PT SHORT TERM GOAL #1   Title Patient will be independent in home exercise program to improve strength/mobility for better functional independence with ADLs.    Baseline 4/24: HEP given 5/30: compelting HEP regularly    Time 4    Period Weeks    Status Achieved    Target Date 09/13/21               PT Long Term Goals - 09/21/21 1339       PT LONG TERM GOAL #1   Title Patient will increase FOTO score to equal to or greater than   60  to demonstrate statistically significant improvement in mobility and quality of life.    Baseline 4/24: give next session 5/30:38    Time 12    Period Weeks    Status New    Target Date 11/08/21      PT LONG TERM GOAL #2   Title Patient will report a worst pain of 3/10 on VAS in  L shoulder to improve tolerance with ADLs and reduced symptoms with activities.    Baseline 4/24: 10/10 5/30: 3/10 at worst in past few daysworse pain about 2 weeks ago    Time 12    Period Weeks    Status Partially Met    Target Date 11/08/21      PT LONG TERM GOAL #3   Title Patient will improve shoulder AROM to > 140 degrees of flexion 12and abduction for improved ability to perform overhead activities.    Baseline 4/24: unable to perform AROM; PROM flexion 102, abduction 95, ER: 15 degrees    Time 12    Period Weeks    Status On-going    Target Date 11/08/21      PT LONG TERM GOAL #4   Title Patient will decrease Quick DASH  score to >40% demonstrating reduced self-reported upper extremity disability.    Baseline 4/24: 88.6% 5/30:75    Time 12    Period Weeks    Status New  Target Date 11/08/21                   Plan - 09/28/21 1607     Clinical Impression Statement Patient presents with good motivation for today's PT session. He presents with improving ability to relax his arm for manual stretching and presents with less trigger point region today. He was able to review and demo good technique with passive L shoulder table activities and reports doing well with these exercise.  He remains very tight with all planes specifically ER. Patient will conitnue to benefit from skilled PT in order to improve his ROM, strength and quality of life at home.    Personal Factors and Comorbidities Comorbidity 3+;Fitness;Past/Current Experience;Transportation    Comorbidities CAD, NSTEMI, HTN, HLD, angina, hemiparesis s/p stroke, arthritis, insomnia, RLS    Examination-Activity Limitations Bathing;Bed Mobility;Bend;Caring for Others;Carry;Dressing;Hygiene/Grooming;Lift;Locomotion Level;Reach Overhead;Self Feeding;Sit;Transfers;Toileting;Stand    Examination-Participation Restrictions Cleaning;Community Activity;Interpersonal Relationship;Meal Prep;Medication Management;Laundry;Shop;Volunteer;Yard Work    [employer]chant [employer] officer Evolving/Moderate complexity    Rehab Potential Fair    PT Frequency 2x / week    PT Duration 12 weeks    PT Treatment/Interventions ADLs/Self Care Home Management;Cryotherapy;Electrical Stimulation;Iontophoresis 38m/ml Dexamethasone;Moist Heat;Traction;Ultrasound;Functional mobility training;Therapeutic activities;Therapeutic exercise;Balance training;Neuromuscular re-education;Manual techniques;Wheelchair mobility training;Patient/family education;Passive range of motion;Dry needling;Energy conservation;Splinting;Taping;Vasopneumatic Device;Vestibular;Visual/perceptual  remediation/compensation;Spinal Manipulations;Joint Manipulations    PT Next Visit Plan follow protocol for PROM , Measure all PROM for upcoming MD visit on 10/04/2021.    PT Home Exercise Plan see above; no updates    Consulted and Agree with Plan of Care Patient             Patient will benefit from skilled therapeutic intervention in order to improve the following deficits and impairments:  Decreased coordination, Decreased mobility, Decreased range of motion, Decreased strength, Decreased scar mobility, Hypomobility, Impaired flexibility, Impaired perceived functional ability, Impaired tone, Impaired UE functional use, Postural dysfunction, Improper body mechanics, Pain  Visit Diagnosis: Stiffness of left shoulder, not elsewhere classified  Muscle weakness (generalized)  Acute pain of left shoulder     Problem List Patient Active Problem List   Diagnosis Date Noted   Coronary atherosclerosis of native coronary artery 11/04/2014   NSTEMI (non-ST elevated myocardial infarction) ([place]    Coronary artery disease due to lipid rich plaque    Hyperlipidemia    Essential hypertension    Exertional angina ([place] 09/08/2014   Unstable angina pectoris ([place] 09/08/2014   Burning chest pain 08/22/2014   Other vitamin B12 deficiency anemia 07/11/2013   Hemiparesis and alteration of sensations as late effects of stroke ([place] 07/10/2013   Arthritis 06/25/2013   Insomnia, persistent 12/20/2012   Spastic hemiplegia affecting dominant side ([place] 12/07/2012   CVA (cerebral infarction) 10/11/2012   Unsteady gait 10/09/2012   HTN (hypertension) 05/31/2012   Family history of CVA 05/17/2012   History of renal stone 05/17/2012   RLS (restless legs syndrome) 01/06/2011   Allergic rhinitis due to pollen 01/06/2011   ED (erectile dysfunction) 01/06/2011   Family history of colon cancer 01/06/2011   Family history of heart disease in male family member before age [address]/13/2012    JLewis Moccasin PT 09/28/2021, 4:33 PM  [facility] [address] [place] [address]Phone: [phone]  Fax:  [phone] Name: JHUEY SCALIAMRN: [id number] of Birth: 4October 07, 1962

## 2021-09-30 ENCOUNTER — Ambulatory Visit: Payer: Medicare Other

## 2021-09-30 DIAGNOSIS — G8929 Other chronic pain: Secondary | ICD-10-CM

## 2021-09-30 DIAGNOSIS — M25612 Stiffness of left shoulder, not elsewhere classified: Secondary | ICD-10-CM | POA: Diagnosis not present

## 2021-09-30 DIAGNOSIS — M25512 Pain in left shoulder: Secondary | ICD-10-CM

## 2021-09-30 DIAGNOSIS — M6281 Muscle weakness (generalized): Secondary | ICD-10-CM

## 2021-09-30 NOTE — Therapy (Signed)
[place] MAIN [facility] SERVICES [address] [place], [place], [address] Phone: [phone]   Fax:  [phone]  Physical Therapy Treatment  Patient Details  Name: Micheal Gonzalez MRN: [id number] Date of Birth: [birth year] No data recorded  Encounter Date: 09/30/2021   PT End of Session - 09/30/21 1607     Visit Number 13    Number of Visits 24    Date for PT Re-Evaluation 11/08/21    Authorization Type 1/10 eval 08/16/21    PT Start Time 1600    PT Stop Time 1645    PT Time Calculation (min) 45 min    Equipment Utilized During Treatment Other (comment)   Left shoulder immobilizer brace   Activity Tolerance Patient tolerated treatment well;No increased pain    Behavior During Therapy [facility] for tasks assessed/performed             Past Medical History:  Diagnosis Date   Allergy    Arthritis    right foot   Coronary artery disease    Triple Bypass done at [facility] Cardiologist Dr. Concha Pyo   ED (erectile dysfunction)    Exertional angina ([place]) 3/41/9622   Folliculitis barbae    Gait disorder    History of stroke    Right hemiparesis   Hypertension    Insomnia    Other vitamin B12 deficiency anemia 07/11/2013   RLS (restless legs syndrome)    Stroke [facility])    June 2014- partial paralysis on right side    Past Surgical History:  Procedure Laterality Date   CARDIAC CATHETERIZATION N/A 09/08/2014   Procedure: Left Heart Cath and Coronary Angiography;  Surgeon: Sherren Mocha, MD;  Location: [place] CV LAB;  Service: Cardiovascular;  Laterality: N/A;   COLONOSCOPY  03/25/11   CORONARY ARTERY BYPASS GRAFT  09-15-14   Triple   TRIGGER FINGER RELEASE Left 12/09/2014   Procedure: RELEASE TRIGGER FINGER/A-1 PULLEY LEFT THUMB, INDEX, MIDDLE, RING FINGERS;  Surgeon: Daryll Brod, MD;  Location: [place];  Service: Orthopedics;  Laterality: Left;    There were no vitals filed for this visit.   Subjective Assessment -  09/30/21 1605     Subjective Patient reports doing well overall and no increased soreness after last visit. Reports hopes to be upgraded by MD on Monday.    Pertinent History Left rotator cuff tendon arthroscopy, tendonesis of long tendon of bicep, debridement. on 08/13/21. Patient has PMH of allergy, arthritis, CAD, ED, angina, gait disorder, stroke 2014 (R hemiparesis), HTN, insomnia, RLS, having a hard time getting out of bed.    Limitations Sitting;Lifting;Walking;House hold activities    How long can you sit comfortably? n/a    How long can you stand comfortably? n/a    How long can you walk comfortably? n/a    Patient Stated Goals to be able to use my L arm again    Currently in Pain? No/denies             INTERVENTIONS:      Manual therapy:  - PROM to left shoulder in supine - Flex/scap/abd/ER/elbow flex Manual stretching at end range x multiple reps with flexion, abduction, and external rotation (0 degrees and approx 45 deg of Abd).  Grade 2-3 GH joint mobs (PA/AP) x multiple reps Gentle distraction- GH (inferior) x multiple reps  *Increased time spent on manual ROM/stretching for ROM measures to be sent to Ortho for f/u visit  PROM:  Left shoulder flex=  116 deg with firm end feel Left Shoulder ABD= 103 deg with firm end feel Left shoulder ER= 30 deg (at 45 deg of ABD) firm end feel     Seated: ice donned to L shoulder in sitting position x 5 min (unbilled) at end of session.     PT dons L shoulder brace    Pt educated throughout session about proper posture and technique with exercises. Improved exercise technique, movement at target joints, use of target muscles after min to mod verbal, visual, tactile cues.     Rationale for Evaluation and Treatment Rehabilitation                                   PT Education - 09/30/21 1606     Education Details ROM norms; Purpose of rehab stages and importance of trying to obtain max ROM.     Person(s) Educated Patient    Methods Explanation;Demonstration;Tactile cues;Verbal cues    Comprehension Verbalized understanding;Returned demonstration;Verbal cues required;Tactile cues required;Need further instruction              PT Short Term Goals - 09/21/21 1337       PT SHORT TERM GOAL #1   Title Patient will be independent in home exercise program to improve strength/mobility for better functional independence with ADLs.    Baseline 4/24: HEP given 5/30: compelting HEP regularly    Time 4    Period Weeks    Status Achieved    Target Date 09/13/21               PT Long Term Goals - 09/21/21 1339       PT LONG TERM GOAL #1   Title Patient will increase FOTO score to equal to or greater than   60  to demonstrate statistically significant improvement in mobility and quality of life.    Baseline 4/24: give next session 5/30:38    Time 12    Period Weeks    Status New    Target Date 11/08/21      PT LONG TERM GOAL #2   Title Patient will report a worst pain of 3/10 on VAS in  L shoulder to improve tolerance with ADLs and reduced symptoms with activities.    Baseline 4/24: 10/10 5/30: 3/10 at worst in past few daysworse pain about 2 weeks ago    Time 12    Period Weeks    Status Partially Met    Target Date 11/08/21      PT LONG TERM GOAL #3   Title Patient will improve shoulder AROM to > 140 degrees of flexion 12and abduction for improved ability to perform overhead activities.    Baseline 4/24: unable to perform AROM; PROM flexion 102, abduction 95, ER: 15 degrees    Time 12    Period Weeks    Status On-going    Target Date 11/08/21      PT LONG TERM GOAL #4   Title Patient will decrease Quick DASH score to >40% demonstrating reduced self-reported upper extremity disability.    Baseline 4/24: 88.6% 5/30:75    Time 12    Period Weeks    Status New    Target Date 11/08/21                   Plan - 09/30/21 1607     Clinical Impression  Statement Patient presents with good motivation  for today's session. He was able to relax well today with manual therapy and presents with much improved ROM since Evaluation and even last measurement on 09/21/2021. Despite these gains- still well below full PROM. He has return visit with Ortho MD next Monday so will check to see if any updates to protocol. Patient will conitnue to benefit from skilled PT in order to improve his ROM, strength and quality of life at home    Personal Factors and Comorbidities Comorbidity 3+;Fitness;Past/Current Experience;Transportation    Comorbidities CAD, NSTEMI, HTN, HLD, angina, hemiparesis s/p stroke, arthritis, insomnia, RLS    Examination-Activity Limitations Bathing;Bed Mobility;Bend;Caring for Others;Carry;Dressing;Hygiene/Grooming;Lift;Locomotion Level;Reach Overhead;Self Feeding;Sit;Transfers;Toileting;Stand    Examination-Participation Restrictions Cleaning;Community Activity;Interpersonal Relationship;Meal Prep;Medication Management;Laundry;Shop;Volunteer;Yard Work    [employer]chant [employer] officer Evolving/Moderate complexity    Rehab Potential Fair    PT Frequency 2x / week    PT Duration 12 weeks    PT Treatment/Interventions ADLs/Self Care Home Management;Cryotherapy;Electrical Stimulation;Iontophoresis 52m/ml Dexamethasone;Moist Heat;Traction;Ultrasound;Functional mobility training;Therapeutic activities;Therapeutic exercise;Balance training;Neuromuscular re-education;Manual techniques;Wheelchair mobility training;Patient/family education;Passive range of motion;Dry needling;Energy conservation;Splinting;Taping;Vasopneumatic Device;Vestibular;Visual/perceptual remediation/compensation;Spinal Manipulations;Joint Manipulations    PT Next Visit Plan follow protocol for PROM , Measure all PROM for upcoming MD visit on 10/04/2021.    PT Home Exercise Plan see above; no updates    Consulted and Agree with Plan of Care Patient             Patient will  benefit from skilled therapeutic intervention in order to improve the following deficits and impairments:  Decreased coordination, Decreased mobility, Decreased range of motion, Decreased strength, Decreased scar mobility, Hypomobility, Impaired flexibility, Impaired perceived functional ability, Impaired tone, Impaired UE functional use, Postural dysfunction, Improper body mechanics, Pain  Visit Diagnosis: Stiffness of left shoulder, not elsewhere classified  Muscle weakness (generalized)  Acute pain of left shoulder  Chronic left shoulder pain     Problem List Patient Active Problem List   Diagnosis Date Noted   Coronary atherosclerosis of native coronary artery 11/04/2014   NSTEMI (non-ST elevated myocardial infarction) ([place]    Coronary artery disease due to lipid rich plaque    Hyperlipidemia    Essential hypertension    Exertional angina ([place] 09/08/2014   Unstable angina pectoris ([place] 09/08/2014   Burning chest pain 08/22/2014   Other vitamin B12 deficiency anemia 07/11/2013   Hemiparesis and alteration of sensations as late effects of stroke ([place] 07/10/2013   Arthritis 06/25/2013   Insomnia, persistent 12/20/2012   Spastic hemiplegia affecting dominant side ([place] 12/07/2012   CVA (cerebral infarction) 10/11/2012   Unsteady gait 10/09/2012   HTN (hypertension) 05/31/2012   Family history of CVA 05/17/2012   History of renal stone 05/17/2012   RLS (restless legs syndrome) 01/06/2011   Allergic rhinitis due to pollen 01/06/2011   ED (erectile dysfunction) 01/06/2011   Family history of colon cancer 01/06/2011   Family history of heart disease in male family member before age [address]/13/2012    JLewis Moccasin PT 09/30/2021, 5:12 PM  [address] [address]Phone: [phone]  Fax:  [phone] Name: JMISHA ANTONINIMRN: [id number] of Birth: [birth year]

## 2021-10-05 ENCOUNTER — Ambulatory Visit: Payer: Medicare Other | Admitting: Physical Therapy

## 2021-10-05 DIAGNOSIS — M25612 Stiffness of left shoulder, not elsewhere classified: Secondary | ICD-10-CM | POA: Diagnosis not present

## 2021-10-05 DIAGNOSIS — G8929 Other chronic pain: Secondary | ICD-10-CM

## 2021-10-05 DIAGNOSIS — M25512 Pain in left shoulder: Secondary | ICD-10-CM

## 2021-10-05 DIAGNOSIS — M6281 Muscle weakness (generalized): Secondary | ICD-10-CM

## 2021-10-05 NOTE — Therapy (Signed)
[place] MAIN [facility] SERVICES [address] [place], [place], [address] Phone: [phone]   Fax:  [phone]  Physical Therapy Treatment  Patient Details  Name: Micheal Gonzalez MRN: [id number] Date of Birth: 18-Jun-1960 No data recorded  Encounter Date: 10/05/2021   PT End of Session - 10/05/21 1055     Visit Number 14    Number of Visits 24    Date for PT Re-Evaluation 11/08/21    Authorization Type 1/10 eval 08/16/21    PT Start Time 1016    PT Stop Time 1059    PT Time Calculation (min) 43 min    Equipment Utilized During Treatment Other (comment)   Left shoulder immobilizer brace   Activity Tolerance Patient tolerated treatment well;No increased pain    Behavior During Therapy [facility] for tasks assessed/performed             Past Medical History:  Diagnosis Date   Allergy    Arthritis    right foot   Coronary artery disease    Triple Bypass done at [facility] Cardiologist Dr. Concha Pyo   ED (erectile dysfunction)    Exertional angina ([place]) 4/98/2641   Folliculitis barbae    Gait disorder    History of stroke    Right hemiparesis   Hypertension    Insomnia    Other vitamin B12 deficiency anemia 07/11/2013   RLS (restless legs syndrome)    Stroke [facility])    June 2014- partial paralysis on right side    Past Surgical History:  Procedure Laterality Date   CARDIAC CATHETERIZATION N/A 09/08/2014   Procedure: Left Heart Cath and Coronary Angiography;  Surgeon: Sherren Mocha, MD;  Location: [place] CV LAB;  Service: Cardiovascular;  Laterality: N/A;   COLONOSCOPY  03/25/11   CORONARY ARTERY BYPASS GRAFT  09-15-14   Triple   TRIGGER FINGER RELEASE Left 12/09/2014   Procedure: RELEASE TRIGGER FINGER/A-1 PULLEY LEFT THUMB, INDEX, MIDDLE, RING FINGERS;  Surgeon: Daryll Brod, MD;  Location: [place];  Service: Orthopedics;  Laterality: Left;    There were no vitals filed for this visit.   Subjective Assessment  - 10/05/21 1040     Subjective Pt reports no pain at this time, went to MD since last appointment and will be able to progress with therex following 8 week mark on 10/07/21.    Pertinent History Left rotator cuff tendon arthroscopy, tendonesis of long tendon of bicep, debridement. on 08/13/21. Patient has PMH of allergy, arthritis, CAD, ED, angina, gait disorder, stroke 2014 (R hemiparesis), HTN, insomnia, RLS, having a hard time getting out of bed.    Limitations Sitting;Lifting;Walking;House hold activities    How long can you sit comfortably? n/a    How long can you stand comfortably? n/a    How long can you walk comfortably? n/a    Patient Stated Goals to be able to use my L arm again    Currently in Pain? No/denies               INTERVENTIONS:      Manual therapy:  - PROM to left shoulder in supine - Flex/scap/abd/ER/elbow flex Manual stretching at end range x multiple reps with flexion, abduction, and external rotation (0 degrees and approx 70 deg of Abd).  Gentle distraction- GH (inferior) x multiple reps  STM to lateral deltoid and to biceps muscle belly in areas of trigger points   PROM:  ER: 40 @ 90 degres ABD,  IR : 55 @ 90 ABD  108 abduction 125 flexion      Seated: ice donned to L shoulder in sitting position x 5 min (unbilled) at end of session.     PT dons L shoulder brace    Pt educated throughout session about proper posture and technique with exercises. Improved exercise technique, movement at target joints, use of target muscles after min to mod verbal, visual, tactile cues.     Rationale for Evaluation and Treatment Rehabilitation                           PT Education - 10/05/21 1041     Education Details Progress with ROM.    Person(s) Educated Patient    Methods Explanation;Demonstration;Tactile cues    Comprehension Verbalized understanding;Returned demonstration;Verbal cues required;Tactile cues required               PT Short Term Goals - 09/21/21 1337       PT SHORT TERM GOAL #1   Title Patient will be independent in home exercise program to improve strength/mobility for better functional independence with ADLs.    Baseline 4/24: HEP given 5/30: compelting HEP regularly    Time 4    Period Weeks    Status Achieved    Target Date 09/13/21               PT Long Term Goals - 09/21/21 1339       PT LONG TERM GOAL #1   Title Patient will increase FOTO score to equal to or greater than   60  to demonstrate statistically significant improvement in mobility and quality of life.    Baseline 4/24: give next session 5/30:38    Time 12    Period Weeks    Status New    Target Date 11/08/21      PT LONG TERM GOAL #2   Title Patient will report a worst pain of 3/10 on VAS in  L shoulder to improve tolerance with ADLs and reduced symptoms with activities.    Baseline 4/24: 10/10 5/30: 3/10 at worst in past few daysworse pain about 2 weeks ago    Time 12    Period Weeks    Status Partially Met    Target Date 11/08/21      PT LONG TERM GOAL #3   Title Patient will improve shoulder AROM to > 140 degrees of flexion 12and abduction for improved ability to perform overhead activities.    Baseline 4/24: unable to perform AROM; PROM flexion 102, abduction 95, ER: 15 degrees    Time 12    Period Weeks    Status On-going    Target Date 11/08/21      PT LONG TERM GOAL #4   Title Patient will decrease Quick DASH score to >40% demonstrating reduced self-reported upper extremity disability.    Baseline 4/24: 88.6% 5/30:75    Time 12    Period Weeks    Status New    Target Date 11/08/21                   Plan - 10/05/21 1055     Clinical Impression Statement Pt presents with good motivation for copmletion of PT interventions. continued with PT protocol as pt stil not at 8 week post operativerly. Next session will begin with progression per MD protocol and note from 10/04/21. Pt ROM this  session in note. Pt  will conitnue to benefit from skilled PT in order to improve L shoulder funciton, ROM, strength and to ipmrove pt overall QOL.    Personal Factors and Comorbidities Comorbidity 3+;Fitness;Past/Current Experience;Transportation    Comorbidities CAD, NSTEMI, HTN, HLD, angina, hemiparesis s/p stroke, arthritis, insomnia, RLS    Examination-Activity Limitations Bathing;Bed Mobility;Bend;Caring for Others;Carry;Dressing;Hygiene/Grooming;Lift;Locomotion Level;Reach Overhead;Self Feeding;Sit;Transfers;Toileting;Stand    Examination-Participation Restrictions Cleaning;Community Activity;Interpersonal Relationship;Meal Prep;Medication Management;Laundry;Shop;Volunteer;Yard Work    Merchant [employer] officer Evolving/Moderate complexity    Rehab Potential Fair    PT Frequency 2x / week    PT Duration 12 weeks    PT Treatment/Interventions ADLs/Self Care Home Management;Cryotherapy;Electrical Stimulation;Iontophoresis 16m/ml Dexamethasone;Moist Heat;Traction;Ultrasound;Functional mobility training;Therapeutic activities;Therapeutic exercise;Balance training;Neuromuscular re-education;Manual techniques;Wheelchair mobility training;Patient/family education;Passive range of motion;Dry needling;Energy conservation;Splinting;Taping;Vasopneumatic Device;Vestibular;Visual/perceptual remediation/compensation;Spinal Manipulations;Joint Manipulations    PT Next Visit Plan follow protocol for PROM , Measure all PROM for upcoming MD visit on 10/04/2021.    PT Home Exercise Plan see above; no updates    Consulted and Agree with Plan of Care Patient             Patient will benefit from skilled therapeutic intervention in order to improve the following deficits and impairments:  Decreased coordination, Decreased mobility, Decreased range of motion, Decreased strength, Decreased scar mobility, Hypomobility, Impaired flexibility, Impaired perceived functional ability, Impaired tone, Impaired UE  functional use, Postural dysfunction, Improper body mechanics, Pain  Visit Diagnosis: Stiffness of left shoulder, not elsewhere classified  Muscle weakness (generalized)  Acute pain of left shoulder  Chronic left shoulder pain     Problem List Patient Active Problem List   Diagnosis Date Noted   Coronary atherosclerosis of native coronary artery 11/04/2014   [place]STEMI (non-ST elevated [place] infarction) [place]    Coronary artery disease due to lipid rich plaque    Hyperlipidemia    Essential hypertension    [place]ertional angina ([place] 09/08/2014   Unstable angina pectoris ([place] 09/08/2014   Burning chest pain 08/22/2014   Other vitamin B12 deficiency anemia 07/11/2013   Hemiparesis and alteration of sensations as late effects of stroke ([place] 07/10/2013   Arthritis 06/25/2013   Insomnia, persistent 12/20/2012   Spastic hemiplegia affecting dominant side ([place] 12/07/2012   CVA (cerebral infarction) 10/11/2012   Unsteady gait 10/09/2012   HTN (hypertension) 05/31/2012   Family history of CVA 05/17/2012   History of renal stone 05/17/2012   RLS (restless legs syndrome) 01/06/2011   Allergic rhinitis due to pollen 01/06/2011   ED (erectile dysfunction) 01/06/2011   Family history of colon cancer 01/06/2011   Family history of heart disease in male family member before age 88809/13/2012    CParticia Lather PT 10/05/2021, 12:37 PM  [id number] [address]Phone: [phone]  Fax:  [phone] Name: JTRACKER MANCEMRN: [id number] of Birth: 404/08/1960

## 2021-10-07 ENCOUNTER — Ambulatory Visit: Payer: Medicare Other

## 2021-10-07 DIAGNOSIS — M79601 Pain in right arm: Secondary | ICD-10-CM

## 2021-10-07 DIAGNOSIS — G8929 Other chronic pain: Secondary | ICD-10-CM

## 2021-10-07 DIAGNOSIS — M79641 Pain in right hand: Secondary | ICD-10-CM

## 2021-10-07 DIAGNOSIS — R278 Other lack of coordination: Secondary | ICD-10-CM

## 2021-10-07 DIAGNOSIS — M25612 Stiffness of left shoulder, not elsewhere classified: Secondary | ICD-10-CM

## 2021-10-07 DIAGNOSIS — M6281 Muscle weakness (generalized): Secondary | ICD-10-CM

## 2021-10-07 DIAGNOSIS — M25512 Pain in left shoulder: Secondary | ICD-10-CM

## 2021-10-07 NOTE — Therapy (Signed)
[place] MAIN [facility] SERVICES [address] [place], [place], [address] Phone: [phone]   Fax:  [phone]  Physical Therapy Treatment  Patient Details  Name: Micheal Gonzalez MRN: [id number] Date of Birth: Oct 20, 1960 No data recorded  Encounter Date: 10/07/2021   PT End of Session - 10/07/21 0828     Visit Number 15    Number of Visits 24    Date for PT Re-Evaluation 11/08/21    Authorization Type 1/10 eval 08/16/21    Progress Note Due on Visit 20    PT Start Time 1616    PT Stop Time 1705    PT Time Calculation (min) 49 min    Equipment Utilized During Treatment Other (comment)   Left shoulder immobilizer brace   Activity Tolerance Patient tolerated treatment well;No increased pain    Behavior During Therapy [facility] for tasks assessed/performed             Past Medical History:  Diagnosis Date   Allergy    Arthritis    right foot   Coronary artery disease    Triple Bypass done at [facility] Cardiologist Dr. Concha Pyo   ED (erectile dysfunction)    Exertional angina ([place]) 0/16/0109   Folliculitis barbae    Gait disorder    History of stroke    Right hemiparesis   Hypertension    Insomnia    Other vitamin B12 deficiency anemia 07/11/2013   RLS (restless legs syndrome)    Stroke [facility])    June 2014- partial paralysis on right side    Past Surgical History:  Procedure Laterality Date   CARDIAC CATHETERIZATION N/A 09/08/2014   Procedure: Left Heart Cath and Coronary Angiography;  Surgeon: Sherren Mocha, MD;  Location: [place] CV LAB;  Service: Cardiovascular;  Laterality: N/A;   COLONOSCOPY  03/25/11   CORONARY ARTERY BYPASS GRAFT  09-15-14   Triple   TRIGGER FINGER RELEASE Left 12/09/2014   Procedure: RELEASE TRIGGER FINGER/A-1 PULLEY LEFT THUMB, INDEX, MIDDLE, RING FINGERS;  Surgeon: Daryll Brod, MD;  Location: [place];  Service: Orthopedics;  Laterality: Left;    There were no vitals filed for this  visit.  INTERVENTIONS:   Copied Phase 2 protocol from MD notes:  At 8 weeks: Start phase II of rehab. AAROM and AROM supine overhead flexion (FF) with contralateral arm to 160 degrees supine and seated (ER) to 70 degrees.   Start flexion strengthening with rhythmic stabilization. Will begin supine, then progress to 45 degrees of elevation, then progress to upright. A) Supine: Begin with no weight and a short arc of motion. Increase range of motion up to a full arc as pain and strength allow. Perform 3 sets of 15 reps, daily. Slowly increase weight up to 1 pound, 2 pounds and 3 pounds with arcs in a supine position. When the patient performs 3 sets of 15 reps of full arcs supine with a 3 pound weight easily, then progress to: B) 45 degrees elevated position: Begin with no weight and a short arc of motion. Increase range of motion up to a full arc as pain and strength allow. Perform 3 sets of 15 reps, daily. Slowly increase weight up to 1 pound, 2 pounds and 3 pounds with arcs in an elevated position. When the patient performs 3 sets of 15 reps of full arcs with a 3 pound weight in 45 degrees of elevation easily, then progress to: C) Upright: May sit or stand upright.  Repeat same series of arcs, reps with gradually increasing weight up to 5 pounds. Avoid lifting weights over shoulder height in the upright position.  Start external rotation strengthening with the arm at side with a band - suggest a yellow band or similar minimal resistance. Avoid sidelying ER exercise.  3. Sling: continue until 8 weeks post-op, then discontinue sling.   Reviewed and explained above protocol and the patient performed the following: -AAROM supine shoulder flex using right UE to assist x 20 reps through 0-130 deg  - PROM supine ER with wand x 20 reps x 2  -supine shoulder flex (partial arc) 0-45 deg AROM 2 sets x 15 reps  -Supine rhythmic stabilization at 45 deg shoulder flex x 30 sec x 3.  - Seated AAROM ER with  wand 2 sets x 15 reps  Subjective Assessment - 10/07/21 0826     Subjective Patient reports that he is 8 weeks out from surgery and ready to begin phase 2 of rehab program today. Denies any pain and presents without sling today.    Pertinent History Left rotator cuff tendon arthroscopy, tendonesis of long tendon of bicep, debridement. on 08/13/21. Patient has PMH of allergy, arthritis, CAD, ED, angina, gait disorder, stroke 2014 (R hemiparesis), HTN, insomnia, RLS, having a hard time getting out of bed.    Limitations Sitting;Lifting;Walking;House hold activities    How long can you sit comfortably? n/a    How long can you stand comfortably? n/a    How long can you walk comfortably? n/a    Patient Stated Goals to be able to use my L arm again    Currently in Pain? No/denies            Access Code: [email or web address] URL: [email or web address] Date: 10/07/2021 Prepared by: Sande Brothers  Exercises - Supine Shoulder Flexion AAROM with Hands Clasped  - 7 x weekly - 3 sets - 15 reps - Supine Shoulder Flexion AAROM with Dowel  - 7 x weekly - 3 sets - 15 reps - Supine Shoulder Rhythmic Stabilization- Flexion/Extension  - 7 x weekly - 3 sets - 15 reps - Seated Shoulder External Rotation AAROM with Dowel  - 7 x weekly - 3 sets - 15 reps   Ice to left shoulder in sitting x 5 min (unbilled)   Education provided throughout session via VC/TC and demonstration to facilitate movement at target joints and correct muscle activation for all testing and exercises performed.   Rationale for Evaluation and Treatment Rehabilitation      .                     PT Education - 10/08/21 0827     Education Details Ed in shoulder protocol- Def of AROM/AAROM    Person(s) Educated Patient    Methods Explanation;Demonstration;Tactile cues;Verbal cues;Handout    Comprehension Verbalized understanding;Returned demonstration;Verbal cues required;Tactile cues required;Need further  instruction              PT Short Term Goals - 09/21/21 1337       PT SHORT TERM GOAL #1   Title Patient will be independent in home exercise program to improve strength/mobility for better functional independence with ADLs.    Baseline 4/24: HEP given 5/30: compelting HEP regularly    Time 4    Period Weeks    Status Achieved    Target Date 09/13/21               PT Long Term Goals -  09/21/21 1339       PT LONG TERM GOAL #1   Title Patient will increase FOTO score to equal to or greater than   60  to demonstrate statistically significant improvement in mobility and quality of life.    Baseline 4/24: give next session 5/30:38    Time 12    Period Weeks    Status New    Target Date 11/08/21      PT LONG TERM GOAL #2   Title Patient will report a worst pain of 3/10 on VAS in  L shoulder to improve tolerance with ADLs and reduced symptoms with activities.    Baseline 4/24: 10/10 5/30: 3/10 at worst in past few daysworse pain about 2 weeks ago    Time 12    Period Weeks    Status Partially Met    Target Date 11/08/21      PT LONG TERM GOAL #3   Title Patient will improve shoulder AROM to > 140 degrees of flexion 12and abduction for improved ability to perform overhead activities.    Baseline 4/24: unable to perform AROM; PROM flexion 102, abduction 95, ER: 15 degrees    Time 12    Period Weeks    Status On-going    Target Date 11/08/21      PT LONG TERM GOAL #4   Title Patient will decrease Quick DASH score to >40% demonstrating reduced self-reported upper extremity disability.    Baseline 4/24: 88.6% 5/30:75    Time 12    Period Weeks    Status New    Target Date 11/08/21                   Plan - 10/07/21 0828     Clinical Impression Statement Patient presents with good motivation and participation. Treatment focused on educating in MD protocol for phase 2 initiated today. Patient performed all AROM/AAROM exercises well today without difficulty.  He responded well to all verbal and visual cues and able to participate well with all instructed exercises without difficulty today. Pt will conitnue to benefit from skilled PT in order to improve L shoulder funciton, ROM, strength and to ipmrove pt overall QOL    Personal Factors and Comorbidities Comorbidity 3+;Fitness;Past/Current Experience;Transportation    Comorbidities CAD, NSTEMI, HTN, HLD, angina, hemiparesis s/p stroke, arthritis, insomnia, RLS    Examination-Activity Limitations Bathing;Bed Mobility;Bend;Caring for Others;Carry;Dressing;Hygiene/Grooming;Lift;Locomotion Level;Reach Overhead;Self Feeding;Sit;Transfers;Toileting;Stand    Examination-Participation Restrictions Cleaning;Community Activity;Interpersonal Relationship;Meal Prep;Medication Management;Laundry;Shop;Volunteer;Yard Work    Merchant [employer] officer Evolving/Moderate complexity    Rehab Potential Fair    PT Frequency 2x / week    PT Duration 12 weeks    PT Treatment/Interventions ADLs/Self Care Home Management;Cryotherapy;Electrical Stimulation;Iontophoresis 17m/ml Dexamethasone;Moist Heat;Traction;Ultrasound;Functional mobility training;Therapeutic activities;Therapeutic exercise;Balance training;Neuromuscular re-education;Manual techniques;Wheelchair mobility training;Patient/family education;Passive range of motion;Dry needling;Energy conservation;Splinting;Taping;Vasopneumatic Device;Vestibular;Visual/perceptual remediation/compensation;Spinal Manipulations;Joint Manipulations    PT Next Visit Plan follow protocol for PROM , Measure all PROM for upcoming MD visit on 10/04/2021.    PT Home Exercise Plan see above; no updates    Consulted and Agree with Plan of Care Patient             Patient will benefit from skilled therapeutic intervention in order to improve the following deficits and impairments:  Decreased coordination, Decreased mobility, Decreased range of motion, Decreased strength, Decreased  scar mobility, Hypomobility, Impaired flexibility, Impaired perceived functional ability, Impaired tone, Impaired UE functional use, Postural dysfunction, Improper body mechanics, Pain  Visit Diagnosis: Stiffness of left shoulder, not  elsewhere classified  Muscle weakness (generalized)  Acute pain of left shoulder  Chronic left shoulder pain  Pain in right arm  Other lack of coordination  Pain in right hand     Problem List Patient Active Problem List   Diagnosis Date Noted   Coronary atherosclerosis of native coronary artery 11/04/2014   NSTEMI (non-ST elevated myocardial infarction) ([place])    Coronary artery disease due to lipid rich plaque    Hyperlipidemia    Essential hypertension    Exertional angina ([place]) 09/08/2014   Unstable angina pectoris ([place]) 09/08/2014   Burning chest pain 08/22/2014   Other vitamin B12 deficiency anemia 07/11/2013   Hemiparesis and alteration of sensations as late effects of stroke ([place]) 07/10/2013   Arthritis 06/25/2013   Insomnia, persistent 12/20/2012   Spastic hemiplegia affecting dominant side ([place]) 12/07/2012   CVA (cerebral infarction) 10/11/2012   Unsteady gait 10/09/2012   HTN (hypertension) 05/31/2012   Family history of CVA 05/17/2012   History of renal stone 05/17/2012   RLS (restless legs syndrome) 01/06/2011   Allergic rhinitis due to pollen 01/06/2011   ED (erectile dysfunction) 01/06/2011   Family history of colon cancer 01/06/2011   Family history of heart disease in male family member before age 8 01/06/2011    Lewis Moccasin, PT 10/08/2021, 8:37 AM  [place], [place], [address] Phone: [phone]   Fax:  [phone]  Name: Micheal Gonzalez MRN: [id number] Date of Birth: Oct 28, 1960

## 2021-10-08 ENCOUNTER — Ambulatory Visit: Payer: Medicare Other | Admitting: Physical Therapy

## 2021-10-12 ENCOUNTER — Ambulatory Visit: Payer: Medicare Other | Admitting: Physical Therapy

## 2021-10-13 ENCOUNTER — Ambulatory Visit: Payer: Medicare Other | Admitting: Physical Therapy

## 2021-10-13 ENCOUNTER — Encounter: Payer: Self-pay | Admitting: Physical Therapy

## 2021-10-13 DIAGNOSIS — M25612 Stiffness of left shoulder, not elsewhere classified: Secondary | ICD-10-CM

## 2021-10-13 DIAGNOSIS — G8929 Other chronic pain: Secondary | ICD-10-CM

## 2021-10-13 DIAGNOSIS — M25512 Pain in left shoulder: Secondary | ICD-10-CM

## 2021-10-13 DIAGNOSIS — M6281 Muscle weakness (generalized): Secondary | ICD-10-CM

## 2021-10-13 NOTE — Therapy (Signed)
OUTPATIENT PHYSICAL THERAPY TREATMENT NOTE   Patient Name: Micheal Gonzalez MRN: [id number] DOB:12-05-1960, 61 y.o., male Today's Date: 10/13/2021  PCP: Dereck Ligas, DO REFERRING PROVIDER: Corky Sing, MD   PT End of Session - 10/13/21 1444     Visit Number 16    Number of Visits 24    Date for PT Re-Evaluation 11/08/21    Authorization Type 1/10 eval 08/16/21    Progress Note Due on Visit 20    PT Start Time 1433    PT Stop Time 1514    PT Time Calculation (min) 41 min    Equipment Utilized During Treatment Other (comment)   Left shoulder immobilizer brace   Activity Tolerance Patient tolerated treatment well;No increased pain    Behavior During Therapy [facility] for tasks assessed/performed             Past Medical History:  Diagnosis Date   Allergy    Arthritis    right foot   Coronary artery disease    Triple Bypass done at [facility] Cardiologist Dr. Concha Pyo   ED (erectile dysfunction)    Exertional angina ([place]) 2/29/7989   Folliculitis barbae    Gait disorder    History of stroke    Right hemiparesis   Hypertension    Insomnia    Other vitamin B12 deficiency anemia 07/11/2013   RLS (restless legs syndrome)    Stroke [facility])    June 2014- partial paralysis on right side   Past Surgical History:  Procedure Laterality Date   CARDIAC CATHETERIZATION N/A 09/08/2014   Procedure: Left Heart Cath and Coronary Angiography;  Surgeon: Sherren Mocha, MD;  Location: [place] [facility];  Service: Cardiovascular;  Laterality: N/A;   COLONOSCOPY  03/25/11   CORONARY ARTERY BYPASS GRAFT  09-15-14   Triple   TRIGGER FINGER RELEASE Left 12/09/2014   Procedure: RELEASE TRIGGER FINGER/A-1 PULLEY LEFT THUMB, INDEX, MIDDLE, RING FINGERS;  Surgeon: Daryll Brod, MD;  Location: [place];  Service: Orthopedics;  Laterality: Left;   Patient Active Problem List   Diagnosis Date Noted   Coronary atherosclerosis of native coronary artery 11/04/2014   NSTEMI (non-ST  elevated myocardial infarction) ([place])    Coronary artery disease due to lipid rich plaque    Hyperlipidemia    Essential hypertension    Exertional angina ([place]) 09/08/2014   Unstable angina pectoris ([place]) 09/08/2014   Burning chest pain 08/22/2014   Other vitamin B12 deficiency anemia 07/11/2013   Hemiparesis and alteration of sensations as late effects of stroke ([place]) 07/10/2013   Arthritis 06/25/2013   Insomnia, persistent 12/20/2012   Spastic hemiplegia affecting dominant side ([place]) 12/07/2012   CVA (cerebral infarction) 10/11/2012   Unsteady gait 10/09/2012   HTN (hypertension) 05/31/2012   Family history of CVA 05/17/2012   History of renal stone 05/17/2012   RLS (restless legs syndrome) 01/06/2011   Allergic rhinitis due to pollen 01/06/2011   ED (erectile dysfunction) 01/06/2011   Family history of colon cancer 01/06/2011   Family history of heart disease in male family member before age 65 01/06/2011    REFERRING DIAG: M24.9 (ICD-10-CM) - Joint derangement, unspecified    THERAPY DIAG:  No diagnosis found.   Rationale for Evaluation and Treatment Rehabilitation   PERTINENT HISTORY: Left rotator cuff tendon arthroscopy, tendonesis of long tendon of bicep, debridement. on 08/13/21. Patient has PMH of allergy, arthritis, CAD, ED, angina, gait disorder, stroke 2014 (R hemiparesis), HTN, insomnia, RLS, having a hard time  getting out of bed.   PRECAUTIONS: See Protocol in Chart   SUBJECTIVE: Pt reports new Hep has been going well and he has been enjoying not needing to utilize sling. Pt reports he is eager to be released for driving again.    PAIN:  Are you having pain? No         TODAY'S TREATMENT:  INTERVENTIONS:    Copied Phase 2 protocol from MD notes:   At 8 weeks: Start phase II of rehab. AAROM and AROM supine overhead flexion (FF) with contralateral arm to 160 degrees supine and seated (ER) to 70 degrees.   Start flexion strengthening with rhythmic  stabilization. Will begin supine, then progress to 45 degrees of elevation, then progress to upright. A) Supine: Begin with no weight and a short arc of motion. Increase range of motion up to a full arc as pain and strength allow. Perform 3 sets of 15 reps, daily. Slowly increase weight up to 1 pound, 2 pounds and 3 pounds with arcs in a supine position. When the patient performs 3 sets of 15 reps of full arcs supine with a 3 pound weight easily, then progress to: B) 45 degrees elevated position: Begin with no weight and a short arc of motion. Increase range of motion up to a full arc as pain and strength allow. Perform 3 sets of 15 reps, daily. Slowly increase weight up to 1 pound, 2 pounds and 3 pounds with arcs in an elevated position. When the patient performs 3 sets of 15 reps of full arcs with a 3 pound weight in 45 degrees of elevation easily, then progress to: C) Upright: May sit or stand upright. Repeat same series of arcs, reps with gradually increasing weight up to 5 pounds. Avoid lifting weights over shoulder height in the upright position.  Start external rotation strengthening with the arm at side with a band - suggest a yellow band or similar minimal resistance. Avoid sidelying ER exercise.  3. Sling: continue until 8 weeks post-op, then discontinue sling.        Exercise/Activity Sets/Reps/Time/ Resistance Assistance Charge type Comments    PROM to left shoulder in supine - Flex/scap/abd/ER/elbow flex Manual stretching at end range x multiple reps with flexion, abduction, and external rotation (0 degrees and approx 70 deg of Abd).  Gentle distraction- GH (inferior) x multiple reps        Manual   Improved Rom without pain compared or muscle guarding to previous sessions   Rhythmic stabilization supine  3 x 45 seconds     therex    Supine arc of motion progression AROM  X 15 x 45 to 90 degrees X 15 x 30 to 90 degrees      therex  progressed from starting at 45 to starting at 30  degrees with good response and no pain   Seated AAROM ER  2 x 12 reps     therex     ER isometrics 12 x 5 seconds     therex  cues for proper performance, no pain noted    wall ladder  X 3 reounds   therex  good ROM, cues for proper orientation, no pain  Treatment Provided this session    Rationale for Evaluation and Treatment Rehabilitation   Pt educated throughout session about proper posture and technique with exercises. Improved exercise technique, movement at target joints, use of target muscles after min to mod verbal, visual, tactile cues. Note: Portions of this document were prepared using Dragon voice recognition software and although reviewed may contain unintentional dictation errors in syntax, grammar, or spelling.     PATIENT EDUCATION: Education details: Exercise technique  Person educated: Patient Education method: Explanation, Demonstration, and Verbal cues Education comprehension: verbalized understanding and returned demonstration     HOME EXERCISE PROGRAM: Access Code: [email or web address] URL: [email or web address] Date: 10/07/2021 Prepared by: Sande Brothers   Exercises - Supine Shoulder Flexion AAROM with Hands Clasped  - 7 x weekly - 3 sets - 15 reps - Supine Shoulder Flexion AAROM with Dowel  - 7 x weekly - 3 sets - 15 reps - Supine Shoulder Rhythmic Stabilization- Flexion/Extension  - 7 x weekly - 3 sets - 15 reps - Seated Shoulder External Rotation AAROM with Dowel  - 7 x weekly - 3 sets - 15 reps     PT Short Term Goals -                 PT SHORT TERM GOAL #1    Title Patient will be independent in home exercise program to improve strength/mobility for better functional independence with ADLs.     Baseline 4/24: HEP given 5/30: compelting HEP regularly     Time 4     Period Weeks     Status Achieved     Target Date 09/13/21                     PT Long Term Goals -                 PT LONG TERM GOAL #1    Title Patient will increase FOTO score to equal to or greater than   60  to demonstrate statistically significant improvement in mobility and quality of life.     Baseline 4/24: give next session 5/30:38     Time 12     Period Weeks     Status New     Target Date 11/08/21          PT LONG TERM GOAL #2    Title Patient will report a worst pain of 3/10 on VAS in  L shoulder to improve tolerance with ADLs and reduced symptoms with activities.     Baseline 4/24: 10/10 5/30: 3/10 at worst in past few daysworse pain about 2 weeks ago     Time 12     Period Weeks     Status Partially Met     Target Date 11/08/21          PT LONG TERM GOAL #3    Title Patient will improve shoulder AROM to > 140 degrees of flexion 12and abduction for improved ability to perform overhead activities.     Baseline 4/24: unable to perform AROM; PROM flexion 102, abduction 95, ER: 15 degrees     Time 12     Period Weeks     Status On-going     Target Date 11/08/21          PT LONG TERM GOAL #4    Title Patient will decrease Quick DASH score to >40% demonstrating reduced self-reported upper extremity disability.     Baseline 4/24: 88.6% 5/30:75  Time 12     Period Weeks     Status New     Target Date 11/08/21                  Plan - 10/13/21 1445     Clinical Impression Statement Patient presents with good motivation and participation. Progressed following surgical protocol and surgeon's notes from previous visit. Pt had no pain and showed good progress with exercises this date. Pt will conitnue to benefit from skilled PT in order to improve L shoulder funciton, ROM, strength and to ipmrove pt overall QOL    Personal Factors and Comorbidities Comorbidity 3+;Fitness;Past/Current Experience;Transportation    Comorbidities CAD, NSTEMI, HTN, HLD, angina, hemiparesis s/p stroke, arthritis, insomnia, RLS    Examination-Activity Limitations Bathing;Bed  Mobility;Bend;Caring for Others;Carry;Dressing;Hygiene/Grooming;Lift;Locomotion Level;Reach Overhead;Self Feeding;Sit;Transfers;Toileting;Stand    Examination-Participation Restrictions Cleaning;Community Activity;Interpersonal Relationship;Meal Prep;Medication Management;Laundry;Shop;Volunteer;Yard Work    [employer] Evolving/Moderate complexity    Rehab Potential Fair    PT Frequency 2x / week    PT Duration 12 weeks    PT Treatment/Interventions ADLs/Self Care Home Management;Cryotherapy;Electrical Stimulation;Iontophoresis 40m/ml Dexamethasone;Moist Heat;Traction;Ultrasound;Functional mobility training;Therapeutic activities;Therapeutic exercise;Balance training;Neuromuscular re-education;Manual techniques;Wheelchair mobility training;Patient/family education;Passive range of motion;Dry needling;Energy conservation;Splinting;Taping;Vasopneumatic Device;Vestibular;Visual/perceptual remediation/compensation;Spinal Manipulations;Joint Manipulations    PT Next Visit Plan follow protocol for PROM , Measure all PROM for upcoming MD visit on 10/04/2021.    PT Home Exercise Plan see above; no updates    Consulted and Agree with Plan of Care Patient                       CParticia Lather PT 10/13/2021, 1:12 PM

## 2021-10-13 NOTE — Therapy (Deleted)
OUTPATIENT PHYSICAL THERAPY TREATMENT NOTE   Patient Name: Micheal Gonzalez MRN: [id number] DOB:[birth year], 61 y.o., male Today's Date: 10/13/2021  PCP: Marland Kitchen REFERRING PROVIDER: ***   PT End of Session - 10/13/21 1444     Visit Number 16    Number of Visits 24    Date for PT Re-Evaluation 11/08/21    Authorization Type 1/10 eval 08/16/21    Progress Note Due on Visit 20    PT Start Time 1433    PT Stop Time 1514    PT Time Calculation (min) 41 min    Equipment Utilized During Treatment Other (comment)   Left shoulder immobilizer brace   Activity Tolerance Patient tolerated treatment well;No increased pain    Behavior During Therapy [facility] for tasks assessed/performed             Past Medical History:  Diagnosis Date   Allergy    Arthritis    right foot   Coronary artery disease    Triple Bypass done at [facility] Cardiologist Dr. Concha Pyo   ED (erectile dysfunction)    Exertional angina ([place]) 6/94/8546   Folliculitis barbae    Gait disorder    History of stroke    Right hemiparesis   Hypertension    Insomnia    Other vitamin B12 deficiency anemia 07/11/2013   RLS (restless legs syndrome)    Stroke [facility])    June 2014- partial paralysis on right side   Past Surgical History:  Procedure Laterality Date   CARDIAC CATHETERIZATION N/A 09/08/2014   Procedure: Left Heart Cath and Coronary Angiography;  Surgeon: Sherren Mocha, MD;  Location: [place] [facility];  Service: Cardiovascular;  Laterality: N/A;   COLONOSCOPY  03/25/11   CORONARY ARTERY BYPASS GRAFT  09-15-14   Triple   TRIGGER FINGER RELEASE Left 12/09/2014   Procedure: RELEASE TRIGGER FINGER/A-1 PULLEY LEFT THUMB, INDEX, MIDDLE, RING FINGERS;  Surgeon: Daryll Brod, MD;  Location: [place];  Service: Orthopedics;  Laterality: Left;   Patient Active Problem List   Diagnosis Date Noted   Coronary atherosclerosis of native coronary artery 11/04/2014   NSTEMI (non-ST elevated myocardial infarction)  ([place])    Coronary artery disease due to lipid rich plaque    Hyperlipidemia    Essential hypertension    Exertional angina ([place]) 09/08/2014   Unstable angina pectoris ([place]) 09/08/2014   Burning chest pain 08/22/2014   Other vitamin B12 deficiency anemia 07/11/2013   Hemiparesis and alteration of sensations as late effects of stroke ([place]) 07/10/2013   Arthritis 06/25/2013   Insomnia, persistent 12/20/2012   Spastic hemiplegia affecting dominant side ([place]) 12/07/2012   CVA (cerebral infarction) 10/11/2012   Unsteady gait 10/09/2012   HTN (hypertension) 05/31/2012   Family history of CVA 05/17/2012   History of renal stone 05/17/2012   RLS (restless legs syndrome) 01/06/2011   Allergic rhinitis due to pollen 01/06/2011   ED (erectile dysfunction) 01/06/2011   Family history of colon cancer 01/06/2011   Family history of heart disease in male family member before age 59 01/06/2011     REFERRING DIAG: M24.9 (ICD-10-CM) - Joint derangement, unspecified   THERAPY DIAG:  No diagnosis found.  Rationale for Evaluation and Treatment Rehabilitation  PERTINENT HISTORY: Left rotator cuff tendon arthroscopy, tendonesis of long tendon of bicep, debridement. on 08/13/21. Patient has PMH of allergy, arthritis, CAD, ED, angina, gait disorder, stroke 2014 (R hemiparesis), HTN, insomnia, RLS, having a hard time getting out of bed.  PRECAUTIONS: See  Protocol in Chart  SUBJECTIVE: ***  PAIN:  Are you having pain? {OPRCPAIN:[id number]}     TODAY'S TREATMENT:  INTERVENTIONS:    Copied Phase 2 protocol from MD notes:   At 8 weeks: Start phase II of rehab. AAROM and AROM supine overhead flexion (FF) with contralateral arm to 160 degrees supine and seated (ER) to 70 degrees.   Start flexion strengthening with rhythmic stabilization. Will begin supine, then progress to 45 degrees of elevation, then progress to upright. A) Supine: Begin with no weight and a short arc of motion. Increase range of  motion up to a full arc as pain and strength allow. Perform 3 sets of 15 reps, daily. Slowly increase weight up to 1 pound, 2 pounds and 3 pounds with arcs in a supine position. When the patient performs 3 sets of 15 reps of full arcs supine with a 3 pound weight easily, then progress to: B) 45 degrees elevated position: Begin with no weight and a short arc of motion. Increase range of motion up to a full arc as pain and strength allow. Perform 3 sets of 15 reps, daily. Slowly increase weight up to 1 pound, 2 pounds and 3 pounds with arcs in an elevated position. When the patient performs 3 sets of 15 reps of full arcs with a 3 pound weight in 45 degrees of elevation easily, then progress to: C) Upright: May sit or stand upright. Repeat same series of arcs, reps with gradually increasing weight up to 5 pounds. Avoid lifting weights over shoulder height in the upright position.  Start external rotation strengthening with the arm at side with a band - suggest a yellow band or similar minimal resistance. Avoid sidelying ER exercise.  3. Sling: continue until 8 weeks post-op, then discontinue sling.     Reviewed and explained above protocol and the patient performed the following: -AAROM supine shoulder flex using right UE to assist x 20 reps through 0-130 deg  - PROM supine ER with wand x 20 reps x 2  -supine shoulder flex (partial arc) 0-45 deg AROM 2 sets x 15 reps  -Supine rhythmic stabilization in supine with UE at 90 degrees 3 x 45 seconds  - Seated AAROM ER with wand 2 sets x 12 reps  Exercise/Activity Sets/Reps/Time/ Resistance Assistance Charge type Comments        Rhythmic stabilization supine  3 x 45 seconds      Supine arc of motion progression AROM  X 15 x 45 to 90 degrees X 15 x 30 to 90 degrees      Seated AAROM ER  2 x 12 reps                                                      Treatment Provided this session   Rationale for Evaluation and Treatment  {[id number]}  Pt educated throughout session about proper posture and technique with exercises. Improved exercise technique, movement at target joints, use of target muscles after min to mod verbal, visual, tactile cues. Note: Portions of this document were prepared using Dragon voice recognition software and although reviewed may contain unintentional dictation errors in syntax, grammar, or spelling.   PATIENT EDUCATION: Education details: Exercise technique  Person educated: Patient Education method: Explanation, Demonstration, and Verbal cues Education comprehension: verbalized understanding and returned demonstration  HOME EXERCISE PROGRAM: Access Code: [email or web address] URL: [email or web address] Date: 10/07/2021 Prepared by: Sande Brothers   Exercises - Supine Shoulder Flexion AAROM with Hands Clasped  - 7 x weekly - 3 sets - 15 reps - Supine Shoulder Flexion AAROM with Dowel  - 7 x weekly - 3 sets - 15 reps - Supine Shoulder Rhythmic Stabilization- Flexion/Extension  - 7 x weekly - 3 sets - 15 reps - Seated Shoulder External Rotation AAROM with Dowel  - 7 x weekly - 3 sets - 15 reps   PT Short Term Goals -        PT SHORT TERM GOAL #1   Title Patient will be independent in home exercise program to improve strength/mobility for better functional independence with ADLs.    Baseline 4/24: HEP given 5/30: compelting HEP regularly    Time 4    Period Weeks    Status Achieved    Target Date 09/13/21              PT Long Term Goals -       PT LONG TERM GOAL #1   Title Patient will increase FOTO score to equal to or greater than   60  to demonstrate statistically significant improvement in mobility and quality of life.    Baseline 4/24: give next session 5/30:38    Time 12    Period Weeks    Status New    Target Date 11/08/21      PT LONG TERM GOAL #2   Title Patient will report a worst pain of 3/10 on VAS in  L shoulder to improve tolerance with  ADLs and reduced symptoms with activities.    Baseline 4/24: 10/10 5/30: 3/10 at worst in past few daysworse pain about 2 weeks ago    Time 12    Period Weeks    Status Partially Met    Target Date 11/08/21      PT LONG TERM GOAL #3   Title Patient will improve shoulder AROM to > 140 degrees of flexion 12and abduction for improved ability to perform overhead activities.    Baseline 4/24: unable to perform AROM; PROM flexion 102, abduction 95, ER: 15 degrees    Time 12    Period Weeks    Status On-going    Target Date 11/08/21      PT LONG TERM GOAL #4   Title Patient will decrease Quick DASH score to >40% demonstrating reduced self-reported upper extremity disability.    Baseline 4/24: 88.6% 5/30:75    Time 12    Period Weeks    Status New    Target Date 11/08/21                 Particia Lather, PT 10/13/2021, 1:12 PM   Particia Lather, PT 10/13/2021, 3:06 PM

## 2021-10-15 ENCOUNTER — Ambulatory Visit: Payer: Medicare Other | Admitting: Physical Therapy

## 2021-10-15 DIAGNOSIS — G8929 Other chronic pain: Secondary | ICD-10-CM

## 2021-10-15 DIAGNOSIS — M25612 Stiffness of left shoulder, not elsewhere classified: Secondary | ICD-10-CM

## 2021-10-15 DIAGNOSIS — M6281 Muscle weakness (generalized): Secondary | ICD-10-CM

## 2021-10-19 ENCOUNTER — Ambulatory Visit: Payer: Medicare Other

## 2021-10-19 DIAGNOSIS — M25512 Pain in left shoulder: Secondary | ICD-10-CM

## 2021-10-19 DIAGNOSIS — G8929 Other chronic pain: Secondary | ICD-10-CM

## 2021-10-19 DIAGNOSIS — M25612 Stiffness of left shoulder, not elsewhere classified: Secondary | ICD-10-CM

## 2021-10-19 DIAGNOSIS — M6281 Muscle weakness (generalized): Secondary | ICD-10-CM

## 2021-10-21 ENCOUNTER — Ambulatory Visit: Payer: Medicare Other

## 2021-10-21 DIAGNOSIS — M6281 Muscle weakness (generalized): Secondary | ICD-10-CM

## 2021-10-21 DIAGNOSIS — M25512 Pain in left shoulder: Secondary | ICD-10-CM

## 2021-10-21 DIAGNOSIS — M25612 Stiffness of left shoulder, not elsewhere classified: Secondary | ICD-10-CM | POA: Diagnosis not present

## 2021-10-21 DIAGNOSIS — G8929 Other chronic pain: Secondary | ICD-10-CM

## 2021-10-21 NOTE — Therapy (Addendum)
OUTPATIENT PHYSICAL THERAPY TREATMENT NOTE   Patient Name: Micheal Gonzalez MRN: [id number] DOB:[birth year], 61 y.o., male Today's Date: 10/21/2021  PCP: Dereck Ligas, DO REFERRING PROVIDER: Corky Sing, MD   PT End of Session - 10/21/21 1517     Visit Number 19    Number of Visits 24    Date for PT Re-Evaluation 11/08/21    Authorization Type BCBS Medicare    Authorization Time Period 08/16/21-11/08/21    Progress Note Due on Visit 20    PT Start Time 1517    PT Stop Time 1600    PT Time Calculation (min) 43 min    Activity Tolerance Patient tolerated treatment well;No increased pain    Behavior During Therapy [facility] for tasks assessed/performed             Past Medical History:  Diagnosis Date   Allergy    Arthritis    right foot   Coronary artery disease    Triple Bypass done at [facility] Cardiologist Dr. Concha Pyo   ED (erectile dysfunction)    Exertional angina ([place]) 07/08/1759   Folliculitis barbae    Gait disorder    History of stroke    Right hemiparesis   Hypertension    Insomnia    Other vitamin B12 deficiency anemia 07/11/2013   RLS (restless legs syndrome)    Stroke [facility])    June 2014- partial paralysis on right side   Past Surgical History:  Procedure Laterality Date   CARDIAC CATHETERIZATION N/A 09/08/2014   Procedure: Left Heart Cath and Coronary Angiography;  Surgeon: Sherren Mocha, MD;  Location: [place] [facility];  Service: Cardiovascular;  Laterality: N/A;   COLONOSCOPY  03/25/11   CORONARY ARTERY BYPASS GRAFT  09-15-14   Triple   TRIGGER FINGER RELEASE Left 12/09/2014   Procedure: RELEASE TRIGGER FINGER/A-1 PULLEY LEFT THUMB, INDEX, MIDDLE, RING FINGERS;  Surgeon: Daryll Brod, MD;  Location: [place];  Service: Orthopedics;  Laterality: Left;   Patient Active Problem List   Diagnosis Date Noted   Coronary atherosclerosis of native coronary artery 11/04/2014   NSTEMI (non-ST elevated myocardial infarction) ([place])     Coronary artery disease due to lipid rich plaque    Hyperlipidemia    Essential hypertension    Exertional angina ([place]) 09/08/2014   Unstable angina pectoris ([place]) 09/08/2014   Burning chest pain 08/22/2014   Other vitamin B12 deficiency anemia 07/11/2013   Hemiparesis and alteration of sensations as late effects of stroke ([place]) 07/10/2013   Arthritis 06/25/2013   Insomnia, persistent 12/20/2012   Spastic hemiplegia affecting dominant side ([place]) 12/07/2012   CVA (cerebral infarction) 10/11/2012   Unsteady gait 10/09/2012   HTN (hypertension) 05/31/2012   Family history of CVA 05/17/2012   History of renal stone 05/17/2012   RLS (restless legs syndrome) 01/06/2011   Allergic rhinitis due to pollen 01/06/2011   ED (erectile dysfunction) 01/06/2011   Family history of colon cancer 01/06/2011   Family history of heart disease in male family member before age 22 01/06/2011    REFERRING DIAG: M24.9 (ICD-10-CM) - Joint derangement, unspecified  THERAPY DIAG:  Stiffness of left shoulder, not elsewhere classified  Muscle weakness (generalized)  Chronic left shoulder pain  Acute pain of left shoulder   Rationale for Evaluation and Treatment Rehabilitation   PERTINENT HISTORY: Left rotator cuff tendon arthroscopy, tendonesis of long tendon of bicep, debridement. on 08/13/21. Patient has PMH of allergy, arthritis, CAD, ED, angina, gait disorder, stroke 2014 (  R hemiparesis), HTN, insomnia, RLS, having a hard time getting out of bed.   PRECAUTIONS: See Protocol in Chart    10/19/21 1522  Symptoms/Limitations  Subjective Pt reports no pain, has been compliant with HEP. Is leaving for [place] on Tuesday for a week.   Pertinent History Left rotator cuff tendon arthroscopy, tendonesis of long tendon of bicep, debridement. on 08/13/21. Patient has PMH of allergy, arthritis, CAD, ED, angina, gait disorder, stroke 2014 (R hemiparesis), HTN, insomnia, RLS, having a hard time getting out of bed.   Patient Stated Goals to be able to use my L arm again  Pain Assessment  Currently in Pain? No/denies          TODAY'S TREATMENT:  INTERVENTIONS:   TherEx: Rhythmic stabilization supine 90 degrees 3x 45 seconds Shoulder AROM; cue to remain within pain free protocol allowing region: -flexion 15x 2 sets, ER 15x, IR 15x  Bicep curl with PVC pipe 15x; 2 sets Seated: -shoulder rolls 10x : cues to do on plane to reduce forward/rounded shoulders -scapular retractions 10x 3 seconds:cues to do on plane for posture  Manual:  PROM to L shoulder in supine with heat behind back: -Flexion, abduction, ER, IR 10x 10 second holds each position       Copied Phase 2 protocol from MD notes:   At 8 weeks: Start phase II of rehab. AAROM and AROM supine overhead flexion (FF) with contralateral arm to 160 degrees supine and seated (ER) to 70 degrees.   Start flexion strengthening with rhythmic stabilization. Will begin supine, then progress to 45 degrees of elevation, then progress to upright. A) Supine: Begin with no weight and a short arc of motion. Increase range of motion up to a full arc as pain and strength allow. Perform 3 sets of 15 reps, daily. Slowly increase weight up to 1 pound, 2 pounds and 3 pounds with arcs in a supine position. When the patient performs 3 sets of 15 reps of full arcs supine with a 3 pound weight easily, then progress to: B) 45 degrees elevated position: Begin with no weight and a short arc of motion. Increase range of motion up to a full arc as pain and strength allow. Perform 3 sets of 15 reps, daily. Slowly increase weight up to 1 pound, 2 pounds and 3 pounds with arcs in an elevated position. When the patient performs 3 sets of 15 reps of full arcs with a 3 pound weight in 45 degrees of elevation easily, then progress to: C) Upright: May sit or stand upright. Repeat same series of arcs, reps with gradually increasing weight up to 5 pounds. Avoid lifting weights over  shoulder height in the upright position.  Start external rotation strengthening with the arm at side with a band - suggest a yellow band or similar minimal resistance. Avoid sidelying ER exercise.  3. Sling: continue until 8 weeks post-op, then discontinue sling.        Treatment Provided this session    Rationale for Evaluation and Treatment Rehabilitation   Pt educated throughout session about proper posture and technique with exercises. Improved exercise technique, movement at target joints, use of target muscles after min to mod verbal, visual, tactile cues. Note: Portions of this document were prepared using Dragon voice recognition software and although reviewed may contain unintentional dictation errors in syntax, grammar, or spelling.     PATIENT EDUCATION: Education details: Exercise technique  Person educated: Patient Education method: Explanation, Demonstration, and Verbal cues Education comprehension:  verbalized understanding and returned demonstration     HOME EXERCISE PROGRAM: Access Code: [email or web address] URL: [email or web address] Date: 10/07/2021 Prepared by: Sande Brothers   Exercises - Supine Shoulder Flexion AAROM with Hands Clasped  - 7 x weekly - 3 sets - 15 reps - Supine Shoulder Flexion AAROM with Dowel  - 7 x weekly - 3 sets - 15 reps - Supine Shoulder Rhythmic Stabilization- Flexion/Extension  - 7 x weekly - 3 sets - 15 reps - Seated Shoulder External Rotation AAROM with Dowel  - 7 x weekly - 3 sets - 15 reps     PT Short Term Goals -                 PT SHORT TERM GOAL #1    Title Patient will be independent in home exercise program to improve strength/mobility for better functional independence with ADLs.     Baseline 4/24: HEP given 5/30: compelting HEP regularly     Time 4     Period Weeks     Status Achieved     Target Date 09/13/21                     PT Long Term Goals -                PT LONG TERM GOAL #1    Title  Patient will increase FOTO score to equal to or greater than   60  to demonstrate statistically significant improvement in mobility and quality of life.     Baseline 4/24: give next session 5/30:38     Time 12     Period Weeks     Status New     Target Date 11/08/21          PT LONG TERM GOAL #2    Title Patient will report a worst pain of 3/10 on VAS in  L shoulder to improve tolerance with ADLs and reduced symptoms with activities.     Baseline 4/24: 10/10 5/30: 3/10 at worst in past few daysworse pain about 2 weeks ago     Time 12     Period Weeks     Status Partially Met     Target Date 11/08/21          PT LONG TERM GOAL #3    Title Patient will improve shoulder AROM to > 140 degrees of flexion 12and abduction for improved ability to perform overhead activities.     Baseline 4/24: unable to perform AROM; PROM flexion 102, abduction 95, ER: 15 degrees     Time 12     Period Weeks     Status On-going     Target Date 11/08/21          PT LONG TERM GOAL #4    Title Patient will decrease Quick DASH score to >40% demonstrating reduced self-reported upper extremity disability.     Baseline 4/24: 88.6% 5/30:75     Time 12     Period Weeks     Status New     Target Date 11/08/21                 Plan - 10/19/21 1536     Clinical Impression Statement Patient referring physician called in regards to protocol as patient requests information on when he can use his cane again. Patient is progressing well with minimal pain. He is able to stabilize against pertubation's well but remains limited with ROM. Patient  will continue to benefit from skilled physical therapy to reduce pain, improve ROM, and return to PLOF.    Personal Factors and Comorbidities Comorbidity 3+;Fitness;Past/Current Experience;Transportation    Comorbidities CAD, NSTEMI, HTN, HLD, angina, hemiparesis s/p stroke, arthritis, insomnia, RLS    Examination-Activity Limitations Bathing;Bed Mobility;Bend;Caring for  Others;Carry;Dressing;Hygiene/Grooming;Lift;Locomotion Level;Reach Overhead;Self Feeding;Sit;Transfers;Toileting;Stand    Examination-Participation Restrictions Cleaning;Community Activity;Interpersonal Relationship;Meal Prep;Medication Management;Laundry;Shop;Volunteer;Yard Work    [employer]chant [employer] officer Evolving/Moderate complexity    Clinical Decision Making Moderate    Rehab Potential Fair    PT Frequency 2x / week    PT Duration 12 weeks    PT Treatment/Interventions ADLs/Self Care Home Management;Cryotherapy;Electrical Stimulation;Iontophoresis 37m/ml Dexamethasone;Moist Heat;Traction;Ultrasound;Functional mobility training;Therapeutic activities;Therapeutic exercise;Balance training;Neuromuscular re-education;Manual techniques;Wheelchair mobility training;Patient/family education;Passive range of motion;Dry needling;Energy conservation;Splinting;Taping;Vasopneumatic Device;Vestibular;Visual/perceptual remediation/compensation;Spinal Manipulations;Joint Manipulations    PT Next Visit Plan follow protocol    PT Home Exercise Plan see above; no updates on 10/19/21    Consulted and Agree with Plan of Care Patient                 MJanna Arch PT 10/21/2021, 4:14 PM

## 2021-10-27 ENCOUNTER — Ambulatory Visit: Payer: Medicare Other | Admitting: Physical Therapy

## 2021-10-29 ENCOUNTER — Ambulatory Visit: Payer: Medicare Other | Admitting: Physical Therapy

## 2021-11-02 ENCOUNTER — Ambulatory Visit: Payer: Medicare Other

## 2021-11-04 ENCOUNTER — Ambulatory Visit: Payer: Medicare Other | Attending: Orthopaedic Surgery

## 2021-11-04 DIAGNOSIS — M79601 Pain in right arm: Secondary | ICD-10-CM | POA: Insufficient documentation

## 2021-11-04 DIAGNOSIS — M25512 Pain in left shoulder: Secondary | ICD-10-CM | POA: Insufficient documentation

## 2021-11-04 DIAGNOSIS — M25612 Stiffness of left shoulder, not elsewhere classified: Secondary | ICD-10-CM | POA: Diagnosis present

## 2021-11-04 DIAGNOSIS — G8929 Other chronic pain: Secondary | ICD-10-CM | POA: Diagnosis present

## 2021-11-04 DIAGNOSIS — M6281 Muscle weakness (generalized): Secondary | ICD-10-CM | POA: Insufficient documentation

## 2021-11-04 NOTE — Therapy (Signed)
OUTPATIENT PHYSICAL THERAPY TREATMENT NOTE/RECERT/ Physical Therapy Progress Note   Dates of reporting period  09/21/21   to   11/04/21    Patient Name: Micheal Gonzalez MRN: [id number] DOB:04-Feb-1961, 61 y.o., male Today's Date: 11/04/2021  PCP: Dereck Ligas, DO REFERRING PROVIDER: Corky Sing, MD   PT End of Session - 11/04/21 1439     Visit Number 20    Number of Visits 19    Date for PT Re-Evaluation 01/27/22    Authorization Type BCBS Medicare    Authorization Time Period 08/16/21-11/08/21    Progress Note Due on Visit 20    PT Start Time 1432    PT Stop Time 1514    PT Time Calculation (min) 42 min    Activity Tolerance Patient tolerated treatment well;No increased pain    Behavior During Therapy [facility] for tasks assessed/performed              Past Medical History:  Diagnosis Date   Allergy    Arthritis    right foot   Coronary artery disease    Triple Bypass done at [facility] Cardiologist Dr. Concha Pyo   ED (erectile dysfunction)    Exertional angina ([place]) 1/32/4401   Folliculitis barbae    Gait disorder    History of stroke    Right hemiparesis   Hypertension    Insomnia    Other vitamin B12 deficiency anemia 07/11/2013   RLS (restless legs syndrome)    Stroke [facility])    June 2014- partial paralysis on right side   Past Surgical History:  Procedure Laterality Date   CARDIAC CATHETERIZATION N/A 09/08/2014   Procedure: Left Heart Cath and Coronary Angiography;  Surgeon: Sherren Mocha, MD;  Location: [place] [facility];  Service: Cardiovascular;  Laterality: N/A;   COLONOSCOPY  03/25/11   CORONARY ARTERY BYPASS GRAFT  09-15-14   Triple   TRIGGER FINGER RELEASE Left 12/09/2014   Procedure: RELEASE TRIGGER FINGER/A-1 PULLEY LEFT THUMB, INDEX, MIDDLE, RING FINGERS;  Surgeon: Daryll Brod, MD;  Location: [place];  Service: Orthopedics;  Laterality: Left;   Patient Active Problem List   Diagnosis Date Noted   Coronary atherosclerosis of  native coronary artery 11/04/2014   NSTEMI (non-ST elevated myocardial infarction) ([place])    Coronary artery disease due to lipid rich plaque    Hyperlipidemia    Essential hypertension    Exertional angina ([place]) 09/08/2014   Unstable angina pectoris ([place]) 09/08/2014   Burning chest pain 08/22/2014   Other vitamin B12 deficiency anemia 07/11/2013   Hemiparesis and alteration of sensations as late effects of stroke ([place]) 07/10/2013   Arthritis 06/25/2013   Insomnia, persistent 12/20/2012   Spastic hemiplegia affecting dominant side ([place]) 12/07/2012   CVA (cerebral infarction) 10/11/2012   Unsteady gait 10/09/2012   HTN (hypertension) 05/31/2012   Family history of CVA 05/17/2012   History of renal stone 05/17/2012   RLS (restless legs syndrome) 01/06/2011   Allergic rhinitis due to pollen 01/06/2011   ED (erectile dysfunction) 01/06/2011   Family history of colon cancer 01/06/2011   Family history of heart disease in male family member before age 90 01/06/2011    REFERRING DIAG: M24.9 (ICD-10-CM) - Joint derangement, unspecified  THERAPY DIAG:  Stiffness of left shoulder, not elsewhere classified  Muscle weakness (generalized)  Chronic left shoulder pain  Acute pain of left shoulder  Pain in right arm   Rationale for Evaluation and Treatment Rehabilitation   PERTINENT HISTORY: Left rotator  cuff tendon arthroscopy, tendonesis of long tendon of bicep, debridement. on 08/13/21. Patient has PMH of allergy, arthritis, CAD, ED, angina, gait disorder, stroke 2014 (R hemiparesis), HTN, insomnia, RLS, having a hard time getting out of bed.   PRECAUTIONS: See Protocol in Chart    10/19/21 1522  Symptoms/Limitations  Subjective Patient has returned from [place].   Pertinent History Left rotator cuff tendon arthroscopy, tendonesis of long tendon of bicep, debridement. on 08/13/21. Patient has PMH of allergy, arthritis, CAD, ED, angina, gait disorder, stroke 2014 (R hemiparesis),  HTN, insomnia, RLS, having a hard time getting out of bed.  Patient Stated Goals to be able to use my L arm again  Pain Assessment  Currently in Pain? No/denies          TODAY'S TREATMENT:  INTERVENTIONS:  Goals:   FOTO : 61% VAS: 01-210  AROM  Left  Shoulder Flexion 111  Shoulder Abduction 115  ER L1  IR T1    QuickDash: 15%   Ask physician about weight restrictions for shoulder  TherEx: Rhythmic stabilization supine 90 degrees 3x 45 seconds 5lb bar: chest press AAROM.  3lb dumbell: bicep curl   Seated: -shoulder rolls 10x : cues to do on plane to reduce forward/rounded shoulders -scapular retractions 10x 3 seconds:cues to do on plane for posture    Manual:  PROM to L shoulder in supine with heat behind back: -Flexion, abduction, ER, IR 10x 10 second holds each position    Trigger Point Dry Needling (TDN), unbilled Education performed with patient regarding potential benefit of TDN. Reviewed precautions and risks with patient. Reviewed special precautions/risks over lung fields which include pneumothorax. Reviewed signs and symptoms of pneumothorax and advised pt to go to ER immediately if these symptoms develop advise them of dry needling treatment. Extensive time spent with pt to ensure full understanding of TDN risks. Pt provided verbal consent to treatment. TDN performed to  with 0.25 x 40 single needle placements with local twitch response (LTR). Pistoning technique utilized. Improved pain-free motion following intervention. Muscles targeted R wrist flexors, extensors, PAD x4 minutes      Copied Phase 2 protocol from MD notes:   At 8 weeks: Start phase II of rehab. AAROM and AROM supine overhead flexion (FF) with contralateral arm to 160 degrees supine and seated (ER) to 70 degrees.   Start flexion strengthening with rhythmic stabilization. Will begin supine, then progress to 45 degrees of elevation, then progress to upright. A) Supine: Begin with no  weight and a short arc of motion. Increase range of motion up to a full arc as pain and strength allow. Perform 3 sets of 15 reps, daily. Slowly increase weight up to 1 pound, 2 pounds and 3 pounds with arcs in a supine position. When the patient performs 3 sets of 15 reps of full arcs supine with a 3 pound weight easily, then progress to: B) 45 degrees elevated position: Begin with no weight and a short arc of motion. Increase range of motion up to a full arc as pain and strength allow. Perform 3 sets of 15 reps, daily. Slowly increase weight up to 1 pound, 2 pounds and 3 pounds with arcs in an elevated position. When the patient performs 3 sets of 15 reps of full arcs with a 3 pound weight in 45 degrees of elevation easily, then progress to: C) Upright: May sit or stand upright. Repeat same series of arcs, reps with gradually increasing weight up to 5 pounds. Avoid lifting  weights over shoulder height in the upright position.  Start external rotation strengthening with the arm at side with a band - suggest a yellow band or similar minimal resistance. Avoid sidelying ER exercise.  3. Sling: continue until 8 weeks post-op, then discontinue sling.        Treatment Provided this session    Rationale for Evaluation and Treatment Rehabilitation   Pt educated throughout session about proper posture and technique with exercises. Improved exercise technique, movement at target joints, use of target muscles after min to mod verbal, visual, tactile cues. Note: Portions of this document were prepared using Dragon voice recognition software and although reviewed may contain unintentional dictation errors in syntax, grammar, or spelling.     PATIENT EDUCATION: Education details: Exercise technique  Person educated: Patient Education method: Explanation, Demonstration, and Verbal cues Education comprehension: verbalized understanding and returned demonstration     HOME EXERCISE PROGRAM: Access Code:  [email or web address] URL: [email or web address] Date: 10/07/2021 Prepared by: Sande Brothers   Exercises - Supine Shoulder Flexion AAROM with Hands Clasped  - 7 x weekly - 3 sets - 15 reps - Supine Shoulder Flexion AAROM with Dowel  - 7 x weekly - 3 sets - 15 reps - Supine Shoulder Rhythmic Stabilization- Flexion/Extension  - 7 x weekly - 3 sets - 15 reps - Seated Shoulder External Rotation AAROM with Dowel  - 7 x weekly - 3 sets - 15 reps     PT Short Term Goals -                 PT SHORT TERM GOAL #1    Title Patient will be independent in home exercise program to improve strength/mobility for better functional independence with ADLs.     Baseline 4/24: HEP given 5/30: compelting HEP regularly     Time 4     Period Weeks     Status Achieved     Target Date 09/13/21                     PT Long Term Goals -                PT LONG TERM GOAL #1    Title Patient will increase FOTO score to equal to or greater than   60  to demonstrate statistically significant improvement in mobility and quality of life.     Baseline 4/24: give next session 5/30:38 7/13: 61%     Time 12     Period Weeks     Status MET    Target Date 11/08/21          PT LONG TERM GOAL #2    Title Patient will report a worst pain of 3/10 on VAS in  L shoulder to improve tolerance with ADLs and reduced symptoms with activities.     Baseline 4/24: 10/10 5/30: 3/10 at worst in past few daysworse pain about 2 weeks ago  7/12: 1-2/10     Time 12     Period Weeks     Status MET    Target Date 11/08/21          PT LONG TERM GOAL #3    Title Patient will improve shoulder AROM to > 140 degrees of flexion 12and abduction for improved ability to perform overhead activities.     Baseline 4/24: unable to perform AROM; PROM flexion 102, abduction 95, ER: 15 degrees 7/13: see note    Time 12  Period Weeks     Status On-going     Target Date 01/27/2022           PT LONG TERM GOAL #4    Title Patient  will decrease Quick DASH score to >40% demonstrating reduced self-reported upper extremity disability.     Baseline 4/24: 88.6% 5/30:75 7/13: 15%     Time 12     Period Weeks     Status MET    Target Date 11/08/21     PT LONG TERM GOAL #5  Title Patient will improve shoulder MMT to 4+/5 to return to PLOF   Baseline 7/13: waiting physician restriction limitations: grossly 2+/5   Time 12   Period Weeks   Status NEW  Target Date 01/27/2022                  Plan     Clinical Impression Statement Patient's goals addressed with patient meeting FOTO, QuickDash, and Pain goal. His ROM has significantly improved and new goal for strength included. Patient's restrictions still present, will request information from physician about restriction of weight in order to progress protocol at this time. Patient's condition has the potential to improve in response to therapy. Maximum improvement is yet to be obtained. The anticipated improvement is attainable and reasonable in a generally predictable time. Patient will continue to benefit from skilled physical therapy to reduce pain, improve ROM, and return to PLOF.    Personal Factors and Comorbidities Comorbidity 3+;Fitness;Past/Current Experience;Transportation    Comorbidities CAD, NSTEMI, HTN, HLD, angina, hemiparesis s/p stroke, arthritis, insomnia, RLS    Examination-Activity Limitations Bathing;Bed Mobility;Bend;Caring for Others;Carry;Dressing;Hygiene/Grooming;Lift;Locomotion Level;Reach Overhead;Self Feeding;Sit;Transfers;Toileting;Stand    Examination-Participation Restrictions Cleaning;Community Activity;Interpersonal Relationship;Meal Prep;Medication Management;Laundry;Shop;Volunteer;Yard Work    [employer]chant [employer] officer Evolving/Moderate complexity    Clinical Decision Making Moderate    Rehab Potential Fair    PT Frequency 2x / week    PT Duration 12 weeks    PT Treatment/Interventions ADLs/Self Care Home  Management;Cryotherapy;Electrical Stimulation;Iontophoresis 19m/ml Dexamethasone;Moist Heat;Traction;Ultrasound;Functional mobility training;Therapeutic activities;Therapeutic exercise;Balance training;Neuromuscular re-education;Manual techniques;Wheelchair mobility training;Patient/family education;Passive range of motion;Dry needling;Energy conservation;Splinting;Taping;Vasopneumatic Device;Vestibular;Visual/perceptual remediation/compensation;Spinal Manipulations;Joint Manipulations    PT Next Visit Plan follow protocol    PT Home Exercise Plan see above; no updates on 10/19/21    Consulted and Agree with Plan of Care Patient                 MJanna Arch PT 11/04/2021, 3:26 PM

## 2021-11-08 NOTE — Therapy (Signed)
OUTPATIENT PHYSICAL THERAPY TREATMENT NOTE   Patient Name: Micheal Gonzalez MRN: [id number] DOB:08/08/1960, 61 y.o., male Today's Date: 11/09/2021  PCP: Dereck Ligas, DO REFERRING PROVIDER: Corky Sing, MD   PT End of Session - 11/09/21 1431     Visit Number 21    Number of Visits 66    Date for PT Re-Evaluation 01/27/22    Authorization Type BCBS Medicare    Authorization Time Period 08/16/21-11/08/21    Progress Note Due on Visit 20    PT Start Time 1430    PT Stop Time 1514    PT Time Calculation (min) 44 min    Activity Tolerance Patient tolerated treatment well;No increased pain    Behavior During Therapy [facility] for tasks assessed/performed               Past Medical History:  Diagnosis Date   Allergy    Arthritis    right foot   Coronary artery disease    Triple Bypass done at [facility] Cardiologist Dr. Concha Pyo   ED (erectile dysfunction)    Exertional angina ([place]) 2/45/8099   Folliculitis barbae    Gait disorder    History of stroke    Right hemiparesis   Hypertension    Insomnia    Other vitamin B12 deficiency anemia 07/11/2013   RLS (restless legs syndrome)    Stroke [facility])    June 2014- partial paralysis on right side   Past Surgical History:  Procedure Laterality Date   CARDIAC CATHETERIZATION N/A 09/08/2014   Procedure: Left Heart Cath and Coronary Angiography;  Surgeon: Sherren Mocha, MD;  Location: [place] [facility];  Service: Cardiovascular;  Laterality: N/A;   COLONOSCOPY  03/25/11   CORONARY ARTERY BYPASS GRAFT  09-15-14   Triple   TRIGGER FINGER RELEASE Left 12/09/2014   Procedure: RELEASE TRIGGER FINGER/A-1 PULLEY LEFT THUMB, INDEX, MIDDLE, RING FINGERS;  Surgeon: Daryll Brod, MD;  Location: [place];  Service: Orthopedics;  Laterality: Left;   Patient Active Problem List   Diagnosis Date Noted   Coronary atherosclerosis of native coronary artery 11/04/2014   NSTEMI (non-ST elevated myocardial infarction) ([place])     Coronary artery disease due to lipid rich plaque    Hyperlipidemia    Essential hypertension    Exertional angina ([place]) 09/08/2014   Unstable angina pectoris ([place]) 09/08/2014   Burning chest pain 08/22/2014   Other vitamin B12 deficiency anemia 07/11/2013   Hemiparesis and alteration of sensations as late effects of stroke ([place]) 07/10/2013   Arthritis 06/25/2013   Insomnia, persistent 12/20/2012   Spastic hemiplegia affecting dominant side ([place]) 12/07/2012   CVA (cerebral infarction) 10/11/2012   Unsteady gait 10/09/2012   HTN (hypertension) 05/31/2012   Family history of CVA 05/17/2012   History of renal stone 05/17/2012   RLS (restless legs syndrome) 01/06/2011   Allergic rhinitis due to pollen 01/06/2011   ED (erectile dysfunction) 01/06/2011   Family history of colon cancer 01/06/2011   Family history of heart disease in male family member before age 25 01/06/2011    REFERRING DIAG: M24.9 (ICD-10-CM) - Joint derangement, unspecified  THERAPY DIAG:  Stiffness of left shoulder, not elsewhere classified  Muscle weakness (generalized)  Chronic left shoulder pain   Rationale for Evaluation and Treatment Rehabilitation   PERTINENT HISTORY: Left rotator cuff tendon arthroscopy, tendonesis of long tendon of bicep, debridement. on 08/13/21. Patient has PMH of allergy, arthritis, CAD, ED, angina, gait disorder, stroke 2014 (R hemiparesis), HTN, insomnia,  RLS, having a hard time getting out of bed.   PRECAUTIONS: See Protocol in Chart    10/19/21 1522  Symptoms/Limitations  Subjective Patient meets with physician on Monday. Will ask about restrictions for weight.   Pertinent History Left rotator cuff tendon arthroscopy, tendonesis of long tendon of bicep, debridement. on 08/13/21. Patient has PMH of allergy, arthritis, CAD, ED, angina, gait disorder, stroke 2014 (R hemiparesis), HTN, insomnia, RLS, having a hard time getting out of bed.  Patient Stated Goals to be able to use my L  arm again  Pain Assessment  Currently in Pain? No/denies          TODAY'S TREATMENT:    TherEx:  3lb dumbell: bicep curl 10x ; 2 sets  AROM 10x each direction: -flexion (second set with 1lb dumbbell) , abduction (second set with 1lb dumbbell),   Scapular punches 10x   Seated: -shoulder rolls 10x : cues to do on plane to reduce forward/rounded shoulders -scapular retractions 10x 3 seconds:cues to do on plane for posture -flexion AROM 10x  -slide hand from knee to hip 10x   Manual:  PROM to L shoulder in supine with heat behind back: -Flexion, abduction, ER, IR 10x 10 second holds each position    Trigger Point Dry Needling (TDN), unbilled Education performed with patient regarding potential benefit of TDN. Reviewed precautions and risks with patient. Reviewed special precautions/risks over lung fields which include pneumothorax. Reviewed signs and symptoms of pneumothorax and advised pt to go to ER immediately if these symptoms develop advise them of dry needling treatment. Extensive time spent with pt to ensure full understanding of TDN risks. Pt provided verbal consent to treatment. TDN performed to  with 0.25 x 40 single needle placements with local twitch response (LTR). Pistoning technique utilized. Improved pain-free motion following intervention. Muscles targeted R wrist flexors, extensors, PAD x2 minutes      Copied Phase 2 protocol from MD notes:   At 8 weeks: Start phase II of rehab. AAROM and AROM supine overhead flexion (FF) with contralateral arm to 160 degrees supine and seated (ER) to 70 degrees.   Start flexion strengthening with rhythmic stabilization. Will begin supine, then progress to 45 degrees of elevation, then progress to upright. A) Supine: Begin with no weight and a short arc of motion. Increase range of motion up to a full arc as pain and strength allow. Perform 3 sets of 15 reps, daily. Slowly increase weight up to 1 pound, 2 pounds and 3 pounds  with arcs in a supine position. When the patient performs 3 sets of 15 reps of full arcs supine with a 3 pound weight easily, then progress to: B) 45 degrees elevated position: Begin with no weight and a short arc of motion. Increase range of motion up to a full arc as pain and strength allow. Perform 3 sets of 15 reps, daily. Slowly increase weight up to 1 pound, 2 pounds and 3 pounds with arcs in an elevated position. When the patient performs 3 sets of 15 reps of full arcs with a 3 pound weight in 45 degrees of elevation easily, then progress to: C) Upright: May sit or stand upright. Repeat same series of arcs, reps with gradually increasing weight up to 5 pounds. Avoid lifting weights over shoulder height in the upright position.  Start external rotation strengthening with the arm at side with a band - suggest a yellow band or similar minimal resistance. Avoid sidelying ER exercise.  3. Sling: continue until  8 weeks post-op, then discontinue sling.        Treatment Provided this session    Rationale for Evaluation and Treatment Rehabilitation   Pt educated throughout session about proper posture and technique with exercises. Improved exercise technique, movement at target joints, use of target muscles after min to mod verbal, visual, tactile cues. Note: Portions of this document were prepared using Dragon voice recognition software and although reviewed may contain unintentional dictation errors in syntax, grammar, or spelling.     PATIENT EDUCATION: Education details: Exercise technique  Person educated: Patient Education method: Explanation, Demonstration, and Verbal cues Education comprehension: verbalized understanding and returned demonstration     HOME EXERCISE PROGRAM: Access Code: [email or web address] URL: [email or web address] Date: 10/07/2021 Prepared by: Sande Brothers   Exercises - Supine Shoulder Flexion AAROM with Hands Clasped  - 7 x weekly - 3 sets - 15  reps - Supine Shoulder Flexion AAROM with Dowel  - 7 x weekly - 3 sets - 15 reps - Supine Shoulder Rhythmic Stabilization- Flexion/Extension  - 7 x weekly - 3 sets - 15 reps - Seated Shoulder External Rotation AAROM with Dowel  - 7 x weekly - 3 sets - 15 reps     PT Short Term Goals -                 PT SHORT TERM GOAL #1    Title Patient will be independent in home exercise program to improve strength/mobility for better functional independence with ADLs.     Baseline 4/24: HEP given 5/30: compelting HEP regularly     Time 4     Period Weeks     Status Achieved     Target Date 09/13/21                     PT Long Term Goals -                PT LONG TERM GOAL #1    Title Patient will increase FOTO score to equal to or greater than   60  to demonstrate statistically significant improvement in mobility and quality of life.     Baseline 4/24: give next session 5/30:38 7/13: 61%     Time 12     Period Weeks     Status MET    Target Date 11/08/21          PT LONG TERM GOAL #2    Title Patient will report a worst pain of 3/10 on VAS in  L shoulder to improve tolerance with ADLs and reduced symptoms with activities.     Baseline 4/24: 10/10 5/30: 3/10 at worst in past few daysworse pain about 2 weeks ago  7/12: 1-2/10     Time 12     Period Weeks     Status MET    Target Date 11/08/21          PT LONG TERM GOAL #3    Title Patient will improve shoulder AROM to > 140 degrees of flexion 12and abduction for improved ability to perform overhead activities.     Baseline 4/24: unable to perform AROM; PROM flexion 102, abduction 95, ER: 15 degrees 7/13: see note    Time 12     Period Weeks     Status On-going     Target Date 01/27/2022           PT LONG TERM GOAL #4    Title Patient will decrease Quick  DASH score to >40% demonstrating reduced self-reported upper extremity disability.     Baseline 4/24: 88.6% 5/30:75 7/13: 15%     Time 12     Period Weeks     Status MET     Target Date 11/08/21     PT LONG TERM GOAL #5  Title Patient will improve shoulder MMT to 4+/5 to return to PLOF   Baseline 7/13: waiting physician restriction limitations: grossly 2+/5   Time 12   Period Weeks   Status NEW  Target Date 01/27/2022                  Plan     Clinical Impression Statement Still awaiting physician answer on weight restriction. Keeping patient on low weights until answer. Patient tolerates progressive stabilization and strengthening well with no pain increase.  Patient will continue to benefit from skilled physical therapy to reduce pain, improve ROM, and return to PLOF.    Personal Factors and Comorbidities Comorbidity 3+;Fitness;Past/Current Experience;Transportation    Comorbidities CAD, NSTEMI, HTN, HLD, angina, hemiparesis s/p stroke, arthritis, insomnia, RLS    Examination-Activity Limitations Bathing;Bed Mobility;Bend;Caring for Others;Carry;Dressing;Hygiene/Grooming;Lift;Locomotion Level;Reach Overhead;Self Feeding;Sit;Transfers;Toileting;Stand    Examination-Participation Restrictions Cleaning;Community Activity;Interpersonal Relationship;Meal Prep;Medication Management;Laundry;Shop;Volunteer;Yard Work    [employer] officer Evolving/Moderate complexity    Clinical Decision Making Moderate    Rehab Potential Fair    PT Frequency 2x / week    PT Duration 12 weeks    PT Treatment/Interventions ADLs/Self Care Home Management;Cryotherapy;Electrical Stimulation;Iontophoresis 48m/ml Dexamethasone;Moist Heat;Traction;Ultrasound;Functional mobility training;Therapeutic activities;Therapeutic exercise;Balance training;Neuromuscular re-education;Manual techniques;Wheelchair mobility training;Patient/family education;Passive range of motion;Dry needling;Energy conservation;Splinting;Taping;Vasopneumatic Device;Vestibular;Visual/perceptual remediation/compensation;Spinal Manipulations;Joint Manipulations    PT Next Visit Plan follow protocol     PT Home Exercise Plan see above; no updates on 10/19/21    Consulted and Agree with Plan of Care Patient                 MJanna Arch PT 11/09/2021, 3:15 PM

## 2021-11-09 ENCOUNTER — Ambulatory Visit: Payer: Medicare Other

## 2021-11-09 DIAGNOSIS — G8929 Other chronic pain: Secondary | ICD-10-CM

## 2021-11-09 DIAGNOSIS — M25612 Stiffness of left shoulder, not elsewhere classified: Secondary | ICD-10-CM | POA: Diagnosis not present

## 2021-11-09 DIAGNOSIS — M6281 Muscle weakness (generalized): Secondary | ICD-10-CM

## 2021-11-10 NOTE — Therapy (Signed)
OUTPATIENT PHYSICAL THERAPY TREATMENT NOTE   Patient Name: Micheal Gonzalez MRN: [id number] DOB:07-31-1960, 61 y.o., male Today's Date: 11/11/2021  PCP: Dereck Ligas, DO REFERRING PROVIDER: Corky Sing, MD   PT End of Session - 11/11/21 1453     Visit Number 22    Number of Visits 104    Date for PT Re-Evaluation 01/27/22    Authorization Type BCBS Medicare    Authorization Time Period 08/16/21-11/08/21    Progress Note Due on Visit 20    PT Start Time 1431    PT Stop Time 1514    PT Time Calculation (min) 43 min    Activity Tolerance Patient tolerated treatment well;No increased pain    Behavior During Therapy [facility] for tasks assessed/performed                Past Medical History:  Diagnosis Date   Allergy    Arthritis    right foot   Coronary artery disease    Triple Bypass done at [facility] Cardiologist Dr. Concha Pyo   ED (erectile dysfunction)    Exertional angina ([place]) 3/73/4287   Folliculitis barbae    Gait disorder    History of stroke    Right hemiparesis   Hypertension    Insomnia    Other vitamin B12 deficiency anemia 07/11/2013   RLS (restless legs syndrome)    Stroke [facility])    June 2014- partial paralysis on right side   Past Surgical History:  Procedure Laterality Date   CARDIAC CATHETERIZATION N/A 09/08/2014   Procedure: Left Heart Cath and Coronary Angiography;  Surgeon: Sherren Mocha, MD;  Location: [place] [facility];  Service: Cardiovascular;  Laterality: N/A;   COLONOSCOPY  03/25/11   CORONARY ARTERY BYPASS GRAFT  09-15-14   Triple   TRIGGER FINGER RELEASE Left 12/09/2014   Procedure: RELEASE TRIGGER FINGER/A-1 PULLEY LEFT THUMB, INDEX, MIDDLE, RING FINGERS;  Surgeon: Daryll Brod, MD;  Location: [place];  Service: Orthopedics;  Laterality: Left;   Patient Active Problem List   Diagnosis Date Noted   Coronary atherosclerosis of native coronary artery 11/04/2014   NSTEMI (non-ST elevated myocardial infarction) ([place])     Coronary artery disease due to lipid rich plaque    Hyperlipidemia    Essential hypertension    Exertional angina ([place]) 09/08/2014   Unstable angina pectoris ([place]) 09/08/2014   Burning chest pain 08/22/2014   Other vitamin B12 deficiency anemia 07/11/2013   Hemiparesis and alteration of sensations as late effects of stroke ([place]) 07/10/2013   Arthritis 06/25/2013   Insomnia, persistent 12/20/2012   Spastic hemiplegia affecting dominant side ([place]) 12/07/2012   CVA (cerebral infarction) 10/11/2012   Unsteady gait 10/09/2012   HTN (hypertension) 05/31/2012   Family history of CVA 05/17/2012   History of renal stone 05/17/2012   RLS (restless legs syndrome) 01/06/2011   Allergic rhinitis due to pollen 01/06/2011   ED (erectile dysfunction) 01/06/2011   Family history of colon cancer 01/06/2011   Family history of heart disease in male family member before age 83 01/06/2011    REFERRING DIAG: M24.9 (ICD-10-CM) - Joint derangement, unspecified  THERAPY DIAG:  Stiffness of left shoulder, not elsewhere classified  Muscle weakness (generalized)  Chronic left shoulder pain  Acute pain of left shoulder   Rationale for Evaluation and Treatment Rehabilitation   PERTINENT HISTORY: Left rotator cuff tendon arthroscopy, tendonesis of long tendon of bicep, debridement. on 08/13/21. Patient has PMH of allergy, arthritis, CAD, ED, angina, gait  disorder, stroke 2014 (R hemiparesis), HTN, insomnia, RLS, having a hard time getting out of bed.   PRECAUTIONS: See Protocol in Chart      Symptoms/Limitations  Subjective Patient reports soreness after last session but no pain.   Pertinent History Left rotator cuff tendon arthroscopy, tendonesis of long tendon of bicep, debridement. on 08/13/21. Patient has PMH of allergy, arthritis, CAD, ED, angina, gait disorder, stroke 2014 (R hemiparesis), HTN, insomnia, RLS, having a hard time getting out of bed.  Patient Stated Goals to be able to use my L arm  again  Pain Assessment  Currently in Pain? No/denies          TODAY'S TREATMENT:    TherEx:  3lb dumbell: bicep curl 10x ; 2 sets  AROM 10x each direction: -flexion (second set with 1lb dumbbell) , abduction (second set with 1lb dumbbell),   Scapular punches 10x ; 2 sets   Seated: -shoulder rolls 10x : cues to do on plane to reduce forward/rounded shoulders -scapular retractions 10x 3 seconds:cues to do on plane for posture -flexion AROM 10x  -slide hand from knee to hip 10x   Manual:  PROM to L shoulder in supine with heat behind back: -Flexion, abduction, ER, IR 10x 10 second holds each position  STM to anterior delt and pectoral musculature of LUE x 4 minutes with implementation of effleurage and pettrisage        Treatment Provided this session    Rationale for Evaluation and Treatment Rehabilitation   Pt educated throughout session about proper posture and technique with exercises. Improved exercise technique, movement at target joints, use of target muscles after min to mod verbal, visual, tactile cues. Note: Portions of this document were prepared using Dragon voice recognition software and although reviewed may contain unintentional dictation errors in syntax, grammar, or spelling.     PATIENT EDUCATION: Education details: Exercise technique  Person educated: Patient Education method: Explanation, Demonstration, and Verbal cues Education comprehension: verbalized understanding and returned demonstration     HOME EXERCISE PROGRAM: Access Code: [email or web address] URL: [email or web address] Date: 10/07/2021 Prepared by: Sande Brothers   Exercises - Supine Shoulder Flexion AAROM with Hands Clasped  - 7 x weekly - 3 sets - 15 reps - Supine Shoulder Flexion AAROM with Dowel  - 7 x weekly - 3 sets - 15 reps - Supine Shoulder Rhythmic Stabilization- Flexion/Extension  - 7 x weekly - 3 sets - 15 reps - Seated Shoulder External Rotation AAROM with  Dowel  - 7 x weekly - 3 sets - 15 reps  NEW 7/20:  Access Code: [email or web address] URL: [email or web address] Date: 11/11/2021 Prepared by: Janna Arch  Exercises - Seated Shoulder Flexion Full Range  - 1 x daily - 7 x weekly - 2 sets - 10 reps - 5 hold - Seated Scapular Retraction  - 1 x daily - 7 x weekly - 2 sets - 10 reps - 5 hold - Seated Shoulder Abduction Full Range  - 1 x daily - 7 x weekly - 2 sets - 10 reps - 5 hold - Seated Elbow Flexion and Extension AROM  - 1 x daily - 7 x weekly - 2 sets - 10 reps - 5 hold - Seated Shoulder Circles  - 1 x daily - 7 x weekly - 2 sets - 10 reps - 5 hold     PT Short Term Goals -                 PT SHORT TERM GOAL #  1    Title Patient will be independent in home exercise program to improve strength/mobility for better functional independence with ADLs.     Baseline 4/24: HEP given 5/30: compelting HEP regularly     Time 4     Period Weeks     Status Achieved     Target Date 09/13/21                     PT Long Term Goals -                PT LONG TERM GOAL #1    Title Patient will increase FOTO score to equal to or greater than   60  to demonstrate statistically significant improvement in mobility and quality of life.     Baseline 4/24: give next session 5/30:38 7/13: 61%     Time 12     Period Weeks     Status MET    Target Date 11/08/21          PT LONG TERM GOAL #2    Title Patient will report a worst pain of 3/10 on VAS in  L shoulder to improve tolerance with ADLs and reduced symptoms with activities.     Baseline 4/24: 10/10 5/30: 3/10 at worst in past few daysworse pain about 2 weeks ago  7/12: 1-2/10     Time 12     Period Weeks     Status MET    Target Date 11/08/21          PT LONG TERM GOAL #3    Title Patient will improve shoulder AROM to > 140 degrees of flexion 12and abduction for improved ability to perform overhead activities.     Baseline 4/24: unable to perform AROM; PROM flexion 102, abduction  95, ER: 15 degrees 7/13: see note    Time 12     Period Weeks     Status On-going     Target Date 01/27/2022           PT LONG TERM GOAL #4    Title Patient will decrease Quick DASH score to >40% demonstrating reduced self-reported upper extremity disability.     Baseline 4/24: 88.6% 5/30:75 7/13: 15%     Time 12     Period Weeks     Status MET    Target Date 11/08/21     PT LONG TERM GOAL #5  Title Patient will improve shoulder MMT to 4+/5 to return to PLOF   Baseline 7/13: waiting physician restriction limitations: grossly 2+/5   Time 12   Period Weeks   Status NEW  Target Date 01/27/2022                  Plan     Clinical Impression Statement Patient presents with excellent motivation. He has significant tension of pectoral musculature that is reduced with STM. NO pain present throughout session; still awaiting physician information on weight restriction. Seated progression of HEP introduced with patient demonstrating understanding.  Patient will continue to benefit from skilled physical therapy to reduce pain, improve ROM, and return to PLOF.    Personal Factors and Comorbidities Comorbidity 3+;Fitness;Past/Current Experience;Transportation    Comorbidities CAD, NSTEMI, HTN, HLD, angina, hemiparesis s/p stroke, arthritis, insomnia, RLS    Examination-Activity Limitations Bathing;Bed Mobility;Bend;Caring for Others;Carry;Dressing;Hygiene/Grooming;Lift;Locomotion Level;Reach Overhead;Self Feeding;Sit;Transfers;Toileting;Stand    Examination-Participation Restrictions Cleaning;Community Activity;Interpersonal Relationship;Meal Prep;Medication Management;Laundry;Shop;Volunteer;Yard Work    [employer] officer Evolving/Moderate complexity    Clinical  Decision Making Moderate    Rehab Potential Fair    PT Frequency 2x / week    PT Duration 12 weeks    PT Treatment/Interventions ADLs/Self Care Home Management;Cryotherapy;Electrical Stimulation;Iontophoresis  57m/ml Dexamethasone;Moist Heat;Traction;Ultrasound;Functional mobility training;Therapeutic activities;Therapeutic exercise;Balance training;Neuromuscular re-education;Manual techniques;Wheelchair mobility training;Patient/family education;Passive range of motion;Dry needling;Energy conservation;Splinting;Taping;Vasopneumatic Device;Vestibular;Visual/perceptual remediation/compensation;Spinal Manipulations;Joint Manipulations    PT Next Visit Plan follow protocol    PT Home Exercise Plan see above; no updates on 10/19/21    Consulted and Agree with Plan of Care Patient                 MJanna Arch PT 11/11/2021, 3:16 PM

## 2021-11-11 ENCOUNTER — Ambulatory Visit: Payer: Medicare Other

## 2021-11-11 DIAGNOSIS — M25612 Stiffness of left shoulder, not elsewhere classified: Secondary | ICD-10-CM

## 2021-11-11 DIAGNOSIS — M6281 Muscle weakness (generalized): Secondary | ICD-10-CM

## 2021-11-11 DIAGNOSIS — G8929 Other chronic pain: Secondary | ICD-10-CM

## 2021-11-11 DIAGNOSIS — M25512 Pain in left shoulder: Secondary | ICD-10-CM

## 2021-11-15 NOTE — Therapy (Signed)
OUTPATIENT PHYSICAL THERAPY TREATMENT NOTE   Patient Name: Micheal Gonzalez MRN: [id number] DOB:06-20-1960, 61 y.o., male Today's Date: 11/16/2021  PCP: Dereck Ligas, DO REFERRING PROVIDER: Corky Sing, MD   PT End of Session - 11/16/21 1428     Visit Number 23    Number of Visits 70    Date for PT Re-Evaluation 01/27/22    Authorization Type BCBS Medicare    Authorization Time Period 08/16/21-11/08/21    Progress Note Due on Visit 20    PT Start Time 1430    PT Stop Time 1514    PT Time Calculation (min) 44 min    Activity Tolerance Patient tolerated treatment well;No increased pain    Behavior During Therapy [facility] for tasks assessed/performed                 Past Medical History:  Diagnosis Date   Allergy    Arthritis    right foot   Coronary artery disease    Triple Bypass done at [facility] Cardiologist Dr. Concha Pyo   ED (erectile dysfunction)    Exertional angina ([place]) 5/53/7482   Folliculitis barbae    Gait disorder    History of stroke    Right hemiparesis   Hypertension    Insomnia    Other vitamin B12 deficiency anemia 07/11/2013   RLS (restless legs syndrome)    Stroke [facility])    June 2014- partial paralysis on right side   Past Surgical History:  Procedure Laterality Date   CARDIAC CATHETERIZATION N/A 09/08/2014   Procedure: Left Heart Cath and Coronary Angiography;  Surgeon: Sherren Mocha, MD;  Location: [place] [facility];  Service: Cardiovascular;  Laterality: N/A;   COLONOSCOPY  03/25/11   CORONARY ARTERY BYPASS GRAFT  09-15-14   Triple   TRIGGER FINGER RELEASE Left 12/09/2014   Procedure: RELEASE TRIGGER FINGER/A-1 PULLEY LEFT THUMB, INDEX, MIDDLE, RING FINGERS;  Surgeon: Daryll Brod, MD;  Location: [place];  Service: Orthopedics;  Laterality: Left;   Patient Active Problem List   Diagnosis Date Noted   Coronary atherosclerosis of native coronary artery 11/04/2014   NSTEMI (non-ST elevated myocardial infarction) ([place])     Coronary artery disease due to lipid rich plaque    Hyperlipidemia    Essential hypertension    Exertional angina ([place]) 09/08/2014   Unstable angina pectoris ([place]) 09/08/2014   Burning chest pain 08/22/2014   Other vitamin B12 deficiency anemia 07/11/2013   Hemiparesis and alteration of sensations as late effects of stroke ([place]) 07/10/2013   Arthritis 06/25/2013   Insomnia, persistent 12/20/2012   Spastic hemiplegia affecting dominant side ([place]) 12/07/2012   CVA (cerebral infarction) 10/11/2012   Unsteady gait 10/09/2012   HTN (hypertension) 05/31/2012   Family history of CVA 05/17/2012   History of renal stone 05/17/2012   RLS (restless legs syndrome) 01/06/2011   Allergic rhinitis due to pollen 01/06/2011   ED (erectile dysfunction) 01/06/2011   Family history of colon cancer 01/06/2011   Family history of heart disease in male family member before age 48 01/06/2011    REFERRING DIAG: M24.9 (ICD-10-CM) - Joint derangement, unspecified  THERAPY DIAG:  Stiffness of left shoulder, not elsewhere classified  Muscle weakness (generalized)  Chronic left shoulder pain   Rationale for Evaluation and Treatment Rehabilitation   PERTINENT HISTORY: Left rotator cuff tendon arthroscopy, tendonesis of long tendon of bicep, debridement. on 08/13/21. Patient has PMH of allergy, arthritis, CAD, ED, angina, gait disorder, stroke 2014 (R hemiparesis),  HTN, insomnia, RLS, having a hard time getting out of bed.   PRECAUTIONS: See Protocol in Chart      Symptoms/Limitations  Subjective Patient saw physician since last session    Pertinent History Left rotator cuff tendon arthroscopy, tendonesis of long tendon of bicep, debridement. on 08/13/21. Patient has PMH of allergy, arthritis, CAD, ED, angina, gait disorder, stroke 2014 (R hemiparesis), HTN, insomnia, RLS, having a hard time getting out of bed.  Patient Stated Goals to be able to use my L arm again  Pain Assessment  Currently in  Pain? No/denies   Approved protocol:    2. PT: Continue PT twice weekly.  Continue range of motion exercise: ER with a stick to 70 degrees, overhead flexion with contralateral arm to 160 degrees supine, IR (sleeper stretch). Consider dry needling for the muscle spasm and increased tone which is causing more tightness/stiffness. Improved in clinic when trying contract/relax technique. Continue strengthening program: Bands for ER performed in upright position, dumbbells for scaption - transition from supine to reclined 45 degrees to upright position per protocol.  Goal to achieve 10lbs flexion for 3x10 in one month.  3. Activities:  A) Approved: Biceps curls, triceps press downs/kick backs. May run, bike, use elliptical (without arms) or stairmaster. B) Not approved: Push-ups, dips, pull-ups, overhead press, bench press or power lifting. No swimming or throwing. No contact sports/activities until 6 months.  4. Continue ibuprofen or other anti-inflammatory as needed. Patient to report any GI side effects related to the ibuprofen. May take Tylenol as needed for pain.  5. May return to using cane with left hand as needed.     TODAY'S TREATMENT:    TherEx:   Supine flexion (1lb dumbbell) 10x, 2 sets Supine abduction ( 1lb dumbbell) 10x 2 sets,     Recline 45 degrees:  3lb dumbell bicep flexion 10x ; x 2 sets   Flexion AROM 10x ; x2 sets Bent arm shoulder abduction 10x; 2 sets RTB tricep extension 10x ; 2 sets   Seated: -shoulder rolls 10x : cues to do on plane to reduce forward/rounded shoulders -scapular retractions 10x 3 seconds:cues to do on plane for posture -shoulder ER 10x RTB ; x 2 sets  -RTB row 10x; 2 sets   -pendulum swing 10x  STM to anterior delt and pectoral musculature of LUE x 4 minutes with implementation of effleurage and pettrisage        Treatment Provided this session    Rationale for Evaluation and Treatment Rehabilitation   Pt educated throughout  session about proper posture and technique with exercises. Improved exercise technique, movement at target joints, use of target muscles after min to mod verbal, visual, tactile cues. Note: Portions of this document were prepared using Dragon voice recognition software and although reviewed may contain unintentional dictation errors in syntax, grammar, or spelling.     PATIENT EDUCATION: Education details: Exercise technique  Person educated: Patient Education method: Explanation, Demonstration, and Verbal cues Education comprehension: verbalized understanding and returned demonstration     HOME EXERCISE PROGRAM: Access Code: [email or web address] URL: [email or web address] Date: 10/07/2021 Prepared by: Sande Brothers   Exercises - Supine Shoulder Flexion AAROM with Hands Clasped  - 7 x weekly - 3 sets - 15 reps - Supine Shoulder Flexion AAROM with Dowel  - 7 x weekly - 3 sets - 15 reps - Supine Shoulder Rhythmic Stabilization- Flexion/Extension  - 7 x weekly - 3 sets - 15 reps - Seated Shoulder External Rotation AAROM with Dowel  -  7 x weekly - 3 sets - 15 reps  NEW 7/20:  Access Code: [email or web address] URL: [email or web address] Date: 11/11/2021 Prepared by: Janna Arch  Exercises - Seated Shoulder Flexion Full Range  - 1 x daily - 7 x weekly - 2 sets - 10 reps - 5 hold - Seated Scapular Retraction  - 1 x daily - 7 x weekly - 2 sets - 10 reps - 5 hold - Seated Shoulder Abduction Full Range  - 1 x daily - 7 x weekly - 2 sets - 10 reps - 5 hold - Seated Elbow Flexion and Extension AROM  - 1 x daily - 7 x weekly - 2 sets - 10 reps - 5 hold - Seated Shoulder Circles  - 1 x daily - 7 x weekly - 2 sets - 10 reps - 5 hold     PT Short Term Goals -                 PT SHORT TERM GOAL #1    Title Patient will be independent in home exercise program to improve strength/mobility for better functional independence with ADLs.     Baseline 4/24: HEP given 5/30: compelting HEP  regularly     Time 4     Period Weeks     Status Achieved     Target Date 09/13/21                     PT Long Term Goals -                PT LONG TERM GOAL #1    Title Patient will increase FOTO score to equal to or greater than   60  to demonstrate statistically significant improvement in mobility and quality of life.     Baseline 4/24: give next session 5/30:38 7/13: 61%     Time 12     Period Weeks     Status MET    Target Date 11/08/21          PT LONG TERM GOAL #2    Title Patient will report a worst pain of 3/10 on VAS in  L shoulder to improve tolerance with ADLs and reduced symptoms with activities.     Baseline 4/24: 10/10 5/30: 3/10 at worst in past few daysworse pain about 2 weeks ago  7/12: 1-2/10     Time 12     Period Weeks     Status MET    Target Date 11/08/21          PT LONG TERM GOAL #3    Title Patient will improve shoulder AROM to > 140 degrees of flexion 12and abduction for improved ability to perform overhead activities.     Baseline 4/24: unable to perform AROM; PROM flexion 102, abduction 95, ER: 15 degrees 7/13: see note    Time 12     Period Weeks     Status On-going     Target Date 01/27/2022           PT LONG TERM GOAL #4    Title Patient will decrease Quick DASH score to >40% demonstrating reduced self-reported upper extremity disability.     Baseline 4/24: 88.6% 5/30:75 7/13: 15%     Time 12     Period Weeks     Status MET    Target Date 11/08/21     PT LONG TERM GOAL #5  Title Patient will improve shoulder MMT to 4+/5  to return to PLOF   Baseline 7/13: waiting physician restriction limitations: grossly 2+/5   Time 12   Period Weeks   Status NEW  Target Date 01/27/2022                  Plan     Clinical Impression Statement Patient's protocol updated allowing for progression of strengthening of post surgical shoulder. Patient is highly motivated to progress strength and presents with cane. Patient tolerates  semi-reclined (45 degree position) well without pain increase.  Patient will continue to benefit from skilled physical therapy to reduce pain, improve ROM, and return to PLOF.    Personal Factors and Comorbidities Comorbidity 3+;Fitness;Past/Current Experience;Transportation    Comorbidities CAD, NSTEMI, HTN, HLD, angina, hemiparesis s/p stroke, arthritis, insomnia, RLS    Examination-Activity Limitations Bathing;Bed Mobility;Bend;Caring for Others;Carry;Dressing;Hygiene/Grooming;Lift;Locomotion Level;Reach Overhead;Self Feeding;Sit;Transfers;Toileting;Stand    Examination-Participation Restrictions Cleaning;Community Activity;Interpersonal Relationship;Meal Prep;Medication Management;Laundry;Shop;Volunteer;Yard Work    [employer] officer Evolving/Moderate complexity    Clinical Decision Making Moderate    Rehab Potential Fair    PT Frequency 2x / week    PT Duration 12 weeks    PT Treatment/Interventions ADLs/Self Care Home Management;Cryotherapy;Electrical Stimulation;Iontophoresis 73m/ml Dexamethasone;Moist Heat;Traction;Ultrasound;Functional mobility training;Therapeutic activities;Therapeutic exercise;Balance training;Neuromuscular re-education;Manual techniques;Wheelchair mobility training;Patient/family education;Passive range of motion;Dry needling;Energy conservation;Splinting;Taping;Vasopneumatic Device;Vestibular;Visual/perceptual remediation/compensation;Spinal Manipulations;Joint Manipulations    PT Next Visit Plan follow protocol    PT Home Exercise Plan see above; no updates on 10/19/21    Consulted and Agree with Plan of Care Patient                 MJanna Arch PT 11/16/2021, 3:14 PM

## 2021-11-16 ENCOUNTER — Ambulatory Visit: Payer: Medicare Other

## 2021-11-16 DIAGNOSIS — M25612 Stiffness of left shoulder, not elsewhere classified: Secondary | ICD-10-CM

## 2021-11-16 DIAGNOSIS — G8929 Other chronic pain: Secondary | ICD-10-CM

## 2021-11-16 DIAGNOSIS — M6281 Muscle weakness (generalized): Secondary | ICD-10-CM

## 2021-11-18 ENCOUNTER — Ambulatory Visit: Payer: Medicare Other

## 2021-11-18 DIAGNOSIS — M25612 Stiffness of left shoulder, not elsewhere classified: Secondary | ICD-10-CM

## 2021-11-18 DIAGNOSIS — G8929 Other chronic pain: Secondary | ICD-10-CM

## 2021-11-18 DIAGNOSIS — M6281 Muscle weakness (generalized): Secondary | ICD-10-CM

## 2021-11-18 DIAGNOSIS — M25512 Pain in left shoulder: Secondary | ICD-10-CM

## 2021-11-18 NOTE — Therapy (Signed)
OUTPATIENT PHYSICAL THERAPY TREATMENT NOTE   Patient Name: Micheal Gonzalez MRN: [id number] DOB:10/04/1960, 61 y.o., male Today's Date: 11/18/2021  PCP: Dereck Ligas, DO REFERRING PROVIDER: Corky Sing, MD   PT End of Session - 11/18/21 1435     Visit Number 24    Number of Visits 38    Date for PT Re-Evaluation 01/27/22    Authorization Type BCBS Medicare    Authorization Time Period 08/16/21-11/08/21    Progress Note Due on Visit 20    PT Start Time 1430    PT Stop Time 1514    PT Time Calculation (min) 44 min    Activity Tolerance Patient tolerated treatment well;No increased pain    Behavior During Therapy [facility] for tasks assessed/performed                  Past Medical History:  Diagnosis Date   Allergy    Arthritis    right foot   Coronary artery disease    Triple Bypass done at [facility] Cardiologist Dr. Concha Pyo   ED (erectile dysfunction)    Exertional angina ([place]) 9/37/3428   Folliculitis barbae    Gait disorder    History of stroke    Right hemiparesis   Hypertension    Insomnia    Other vitamin B12 deficiency anemia 07/11/2013   RLS (restless legs syndrome)    Stroke [facility])    June 2014- partial paralysis on right side   Past Surgical History:  Procedure Laterality Date   CARDIAC CATHETERIZATION N/A 09/08/2014   Procedure: Left Heart Cath and Coronary Angiography;  Surgeon: Sherren Mocha, MD;  Location: [place] [facility];  Service: Cardiovascular;  Laterality: N/A;   COLONOSCOPY  03/25/11   CORONARY ARTERY BYPASS GRAFT  09-15-14   Triple   TRIGGER FINGER RELEASE Left 12/09/2014   Procedure: RELEASE TRIGGER FINGER/A-1 PULLEY LEFT THUMB, INDEX, MIDDLE, RING FINGERS;  Surgeon: Daryll Brod, MD;  Location: [place];  Service: Orthopedics;  Laterality: Left;   Patient Active Problem List   Diagnosis Date Noted   Coronary atherosclerosis of native coronary artery 11/04/2014   NSTEMI (non-ST elevated myocardial infarction) ([place])     Coronary artery disease due to lipid rich plaque    Hyperlipidemia    Essential hypertension    Exertional angina ([place]) 09/08/2014   Unstable angina pectoris ([place]) 09/08/2014   Burning chest pain 08/22/2014   Other vitamin B12 deficiency anemia 07/11/2013   Hemiparesis and alteration of sensations as late effects of stroke ([place]) 07/10/2013   Arthritis 06/25/2013   Insomnia, persistent 12/20/2012   Spastic hemiplegia affecting dominant side ([place]) 12/07/2012   CVA (cerebral infarction) 10/11/2012   Unsteady gait 10/09/2012   HTN (hypertension) 05/31/2012   Family history of CVA 05/17/2012   History of renal stone 05/17/2012   RLS (restless legs syndrome) 01/06/2011   Allergic rhinitis due to pollen 01/06/2011   ED (erectile dysfunction) 01/06/2011   Family history of colon cancer 01/06/2011   Family history of heart disease in male family member before age 31 01/06/2011    REFERRING DIAG: M24.9 (ICD-10-CM) - Joint derangement, unspecified  THERAPY DIAG:  Stiffness of left shoulder, not elsewhere classified  Muscle weakness (generalized)  Chronic left shoulder pain  Acute pain of left shoulder   Rationale for Evaluation and Treatment Rehabilitation   PERTINENT HISTORY: Left rotator cuff tendon arthroscopy, tendonesis of long tendon of bicep, debridement. on 08/13/21. Patient has PMH of allergy, arthritis, CAD, ED,  angina, gait disorder, stroke 2014 (R hemiparesis), HTN, insomnia, RLS, having a hard time getting out of bed.   PRECAUTIONS: See Protocol in Chart      Symptoms/Limitations  Subjective Patient reports feeling sore after last session.   Pertinent History Left rotator cuff tendon arthroscopy, tendonesis of long tendon of bicep, debridement. on 08/13/21. Patient has PMH of allergy, arthritis, CAD, ED, angina, gait disorder, stroke 2014 (R hemiparesis), HTN, insomnia, RLS, having a hard time getting out of bed.  Patient Stated Goals to be able to use my L arm again   Pain Assessment  Currently in Pain? No/denies   Approved protocol:    2. PT: Continue PT twice weekly.  Continue range of motion exercise: ER with a stick to 70 degrees, overhead flexion with contralateral arm to 160 degrees supine, IR (sleeper stretch). Consider dry needling for the muscle spasm and increased tone which is causing more tightness/stiffness. Improved in clinic when trying contract/relax technique. Continue strengthening program: Bands for ER performed in upright position, dumbbells for scaption - transition from supine to reclined 45 degrees to upright position per protocol.  Goal to achieve 10lbs flexion for 3x10 in one month.  3. Activities:  A) Approved: Biceps curls, triceps press downs/kick backs. May run, bike, use elliptical (without arms) or stairmaster. B) Not approved: Push-ups, dips, pull-ups, overhead press, bench press or power lifting. No swimming or throwing. No contact sports/activities until 6 months.  4. Continue ibuprofen or other anti-inflammatory as needed. Patient to report any GI side effects related to the ibuprofen. May take Tylenol as needed for pain.  5. May return to using cane with left hand as needed.     TODAY'S TREATMENT:    TherEx:  Supine: scapular stabilization with flexion 10x ; hold 5 seconds  Supine flexion (2lb dumbbell) 10x, 2 sets; superset with scapular stabilization 30 seconds Supine abduction ( 2lb dumbbell) 10x 2 sets,     Recline 45 degrees:  4lb dumbell bicep flexion 10x ; x 2 sets   Flexion AROM 10x ;  Bent arm shoulder abduction 10x;  RTB tricep extension 10x ; 2 sets   Seated: -shoulder rolls 10x : cues to do on plane to reduce forward/rounded shoulders -scapular retractions 10x 3 seconds:cues to do on plane for posture -shoulder ER 10x RTB ; x 2 sets  -RTB row 10x; 2 sets    Standing:  Finger wall walk 5x         Treatment Provided this session    Rationale for Evaluation and Treatment  Rehabilitation   Pt educated throughout session about proper posture and technique with exercises. Improved exercise technique, movement at target joints, use of target muscles after min to mod verbal, visual, tactile cues. Note: Portions of this document were prepared using Dragon voice recognition software and although reviewed may contain unintentional dictation errors in syntax, grammar, or spelling.     PATIENT EDUCATION: Education details: Exercise technique  Person educated: Patient Education method: Explanation, Demonstration, and Verbal cues Education comprehension: verbalized understanding and returned demonstration     HOME EXERCISE PROGRAM: Access Code: [email or web address] URL: [email or web address] Date: 10/07/2021 Prepared by: Sande Brothers   Exercises - Supine Shoulder Flexion AAROM with Hands Clasped  - 7 x weekly - 3 sets - 15 reps - Supine Shoulder Flexion AAROM with Dowel  - 7 x weekly - 3 sets - 15 reps - Supine Shoulder Rhythmic Stabilization- Flexion/Extension  - 7 x weekly - 3 sets - 15 reps - Seated  Shoulder External Rotation AAROM with Dowel  - 7 x weekly - 3 sets - 15 reps  NEW 7/20:  Access Code: [email or web address] URL: [email or web address] Date: 11/11/2021 Prepared by: Janna Arch  Exercises - Seated Shoulder Flexion Full Range  - 1 x daily - 7 x weekly - 2 sets - 10 reps - 5 hold - Seated Scapular Retraction  - 1 x daily - 7 x weekly - 2 sets - 10 reps - 5 hold - Seated Shoulder Abduction Full Range  - 1 x daily - 7 x weekly - 2 sets - 10 reps - 5 hold - Seated Elbow Flexion and Extension AROM  - 1 x daily - 7 x weekly - 2 sets - 10 reps - 5 hold - Seated Shoulder Circles  - 1 x daily - 7 x weekly - 2 sets - 10 reps - 5 hold     PT Short Term Goals -                 PT SHORT TERM GOAL #1    Title Patient will be independent in home exercise program to improve strength/mobility for better functional independence with ADLs.      Baseline 4/24: HEP given 5/30: compelting HEP regularly     Time 4     Period Weeks     Status Achieved     Target Date 09/13/21                     PT Long Term Goals -                PT LONG TERM GOAL #1    Title Patient will increase FOTO score to equal to or greater than   60  to demonstrate statistically significant improvement in mobility and quality of life.     Baseline 4/24: give next session 5/30:38 7/13: 61%     Time 12     Period Weeks     Status MET    Target Date 11/08/21          PT LONG TERM GOAL #2    Title Patient will report a worst pain of 3/10 on VAS in  L shoulder to improve tolerance with ADLs and reduced symptoms with activities.     Baseline 4/24: 10/10 5/30: 3/10 at worst in past few daysworse pain about 2 weeks ago  7/12: 1-2/10     Time 12     Period Weeks     Status MET    Target Date 11/08/21          PT LONG TERM GOAL #3    Title Patient will improve shoulder AROM to > 140 degrees of flexion 12and abduction for improved ability to perform overhead activities.     Baseline 4/24: unable to perform AROM; PROM flexion 102, abduction 95, ER: 15 degrees 7/13: see note    Time 12     Period Weeks     Status On-going     Target Date 01/27/2022           PT LONG TERM GOAL #4    Title Patient will decrease Quick DASH score to >40% demonstrating reduced self-reported upper extremity disability.     Baseline 4/24: 88.6% 5/30:75 7/13: 15%     Time 12     Period Weeks     Status MET    Target Date 11/08/21     PT LONG TERM GOAL #5  Title Patient will improve shoulder MMT to 4+/5 to return to PLOF   Baseline 7/13: waiting physician restriction limitations: grossly 2+/5   Time 12   Period Weeks   Status NEW  Target Date 01/27/2022                  Plan     Clinical Impression Statement Patient's tolerates progression of strengthening to 2lb dumbbell from 1 lb dumbbell and 3lb to 4lb. Patient is highly motivated for progression of care.  He is able to perform standing wall walk for first time without pain increase allowing for progression of motion. Patient will continue to benefit from skilled physical therapy to reduce pain, improve ROM, and return to PLOF.    Personal Factors and Comorbidities Comorbidity 3+;Fitness;Past/Current Experience;Transportation    Comorbidities CAD, NSTEMI, HTN, HLD, angina, hemiparesis s/p stroke, arthritis, insomnia, RLS    Examination-Activity Limitations Bathing;Bed Mobility;Bend;Caring for Others;Carry;Dressing;Hygiene/Grooming;Lift;Locomotion Level;Reach Overhead;Self Feeding;Sit;Transfers;Toileting;Stand    Examination-Participation Restrictions Cleaning;Community Activity;Interpersonal Relationship;Meal Prep;Medication Management;Laundry;Shop;Volunteer;Yard Work    [employer] officer Evolving/Moderate complexity    Clinical Decision Making Moderate    Rehab Potential Fair    PT Frequency 2x / week    PT Duration 12 weeks    PT Treatment/Interventions ADLs/Self Care Home Management;Cryotherapy;Electrical Stimulation;Iontophoresis 20m/ml Dexamethasone;Moist Heat;Traction;Ultrasound;Functional mobility training;Therapeutic activities;Therapeutic exercise;Balance training;Neuromuscular re-education;Manual techniques;Wheelchair mobility training;Patient/family education;Passive range of motion;Dry needling;Energy conservation;Splinting;Taping;Vasopneumatic Device;Vestibular;Visual/perceptual remediation/compensation;Spinal Manipulations;Joint Manipulations    PT Next Visit Plan follow protocol    PT Home Exercise Plan see above; no updates on 10/19/21    Consulted and Agree with Plan of Care Patient                 MJanna Arch PT 11/18/2021, 3:20 PM

## 2021-11-23 ENCOUNTER — Ambulatory Visit: Payer: Medicare Other | Attending: Orthopaedic Surgery

## 2021-11-23 DIAGNOSIS — M6281 Muscle weakness (generalized): Secondary | ICD-10-CM | POA: Diagnosis present

## 2021-11-23 DIAGNOSIS — M79601 Pain in right arm: Secondary | ICD-10-CM | POA: Insufficient documentation

## 2021-11-23 DIAGNOSIS — M25612 Stiffness of left shoulder, not elsewhere classified: Secondary | ICD-10-CM | POA: Diagnosis present

## 2021-11-23 DIAGNOSIS — G8929 Other chronic pain: Secondary | ICD-10-CM | POA: Insufficient documentation

## 2021-11-23 DIAGNOSIS — M25512 Pain in left shoulder: Secondary | ICD-10-CM | POA: Diagnosis present

## 2021-11-23 NOTE — Therapy (Signed)
OUTPATIENT PHYSICAL THERAPY TREATMENT NOTE   Patient Name: Micheal Gonzalez MRN: [id number] DOB:1960-06-17, 61 y.o., male Today's Date: 11/23/2021  PCP: Dereck Ligas, DO REFERRING PROVIDER: Corky Sing, MD   PT End of Session - 11/23/21 1436     Visit Number 25    Number of Visits 77    Date for PT Re-Evaluation 01/27/22    Authorization Type BCBS Medicare    Authorization Time Period 08/16/21-11/08/21    Progress Note Due on Visit 20    PT Start Time 1433    PT Stop Time 1514    PT Time Calculation (min) 41 min    Activity Tolerance Patient tolerated treatment well;No increased pain    Behavior During Therapy [place] [place] for tasks assessed/performed                  Past Medical History:  Diagnosis Date   Allergy    Arthritis    right foot   Coronary artery disease    Triple Bypass done at [facility] Cardiologist Dr. Concha Pyo   ED (erectile dysfunction)    Exertional angina ([place]) 3/35/4562   Folliculitis barbae    Gait disorder    History of stroke    Right hemiparesis   Hypertension    Insomnia    Other vitamin B12 deficiency anemia 07/11/2013   RLS (restless legs syndrome)    Stroke [facility])    June 2014- partial paralysis on right side   Past Surgical History:  Procedure Laterality Date   CARDIAC CATHETERIZATION N/A 09/08/2014   Procedure: Left Heart Cath and Coronary Angiography;  Surgeon: Sherren Mocha, MD;  Location: [place] [facility];  Service: Cardiovascular;  Laterality: N/A;   COLONOSCOPY  03/25/11   CORONARY ARTERY BYPASS GRAFT  09-15-14   Triple   TRIGGER FINGER RELEASE Left 12/09/2014   Procedure: RELEASE TRIGGER FINGER/A-1 PULLEY LEFT THUMB, INDEX, MIDDLE, RING FINGERS;  Surgeon: Daryll Brod, MD;  Location: [place];  Service: Orthopedics;  Laterality: Left;   Patient Active Problem List   Diagnosis Date Noted   Coronary atherosclerosis of native coronary artery 11/04/2014   NSTEMI (non-ST elevated myocardial infarction) ([place])     Coronary artery disease due to lipid rich plaque    Hyperlipidemia    Essential hypertension    Exertional angina ([place]) 09/08/2014   Unstable angina pectoris ([place]) 09/08/2014   Burning chest pain 08/22/2014   Other vitamin B12 deficiency anemia 07/11/2013   Hemiparesis and alteration of sensations as late effects of stroke ([place]) 07/10/2013   Arthritis 06/25/2013   Insomnia, persistent 12/20/2012   Spastic hemiplegia affecting dominant side ([place]) 12/07/2012   CVA (cerebral infarction) 10/11/2012   Unsteady gait 10/09/2012   HTN (hypertension) 05/31/2012   Family history of CVA 05/17/2012   History of renal stone 05/17/2012   RLS (restless legs syndrome) 01/06/2011   Allergic rhinitis due to pollen 01/06/2011   ED (erectile dysfunction) 01/06/2011   Family history of colon cancer 01/06/2011   Family history of heart disease in male family member before age 38 01/06/2011    REFERRING DIAG: M24.9 (ICD-10-CM) - Joint derangement, unspecified  THERAPY DIAG:  Stiffness of left shoulder, not elsewhere classified  Muscle weakness (generalized)  Chronic left shoulder pain  Acute pain of left shoulder  Pain in right arm   Rationale for Evaluation and Treatment Rehabilitation   PERTINENT HISTORY: Left rotator cuff tendon arthroscopy, tendonesis of long tendon of bicep, debridement. on 08/13/21. Patient has PMH  of allergy, arthritis, CAD, ED, angina, gait disorder, stroke 2014 (R hemiparesis), HTN, insomnia, RLS, having a hard time getting out of bed.   PRECAUTIONS: See Protocol in Chart      Symptoms/Limitations  Subjective Patient reports doing well and feels like he is progressing well.   Pertinent History Left rotator cuff tendon arthroscopy, tendonesis of long tendon of bicep, debridement. on 08/13/21. Patient has PMH of allergy, arthritis, CAD, ED, angina, gait disorder, stroke 2014 (R hemiparesis), HTN, insomnia, RLS, having a hard time getting out of bed.  Patient Stated  Goals to be able to use my L arm again  Pain Assessment  Currently in Pain? No/denies   Approved protocol:    2. PT: Continue PT twice weekly.  Continue range of motion exercise: ER with a stick to 70 degrees, overhead flexion with contralateral arm to 160 degrees supine, IR (sleeper stretch). Consider dry needling for the muscle spasm and increased tone which is causing more tightness/stiffness. Improved in clinic when trying contract/relax technique. Continue strengthening program: Bands for ER performed in upright position, dumbbells for scaption - transition from supine to reclined 45 degrees to upright position per protocol.  Goal to achieve 10lbs flexion for 3x10 in one month.  3. Activities:  A) Approved: Biceps curls, triceps press downs/kick backs. May run, bike, use elliptical (without arms) or stairmaster. B) Not approved: Push-ups, dips, pull-ups, overhead press, bench press or power lifting. No swimming or throwing. No contact sports/activities until 6 months.  4. Continue ibuprofen or other anti-inflammatory as needed. Patient to report any GI side effects related to the ibuprofen. May take Tylenol as needed for pain.  5. May return to using cane with left hand as needed.     TODAY'S TREATMENT:    TherEx:  LEFT UE  Supine ER with cane to approx 70 deg 2 sets x 12 reps Supine Shoulder flex to approx 160 deg using wand/cane 2 sets of 12 reps Supine Serratus puches/Scapular protraction/retraction -2 sets of 12 reps  Supine: scapular stabilization with flexion 10x ; hold 5 seconds  Supine flexion  3# dumbell- 10x, 2 sets Supine abduction ( 3lb dumbbell) 10x 2 sets    Recline 45 degrees:  4lb dumbell bicep flexion 12x ; x 2 sets   Flexion AROM 10x (no pain reported)  Scaption: 3# 2 sets of 12 rep- VC for correct technique     Standing:  -Shoulder ER with RTB 2 sets of 12 reps.  -Tricep ext press down (matrix cable system) 7.5 lb 2 sets of 10 reps         Treatment Provided this session    Rationale for Evaluation and Treatment Rehabilitation   Pt educated throughout session about proper posture and technique with exercises. Improved exercise technique, movement at target joints, use of target muscles after min to mod verbal, visual, tactile cues. Note: Portions of this document were prepared using Dragon voice recognition software and although reviewed may contain unintentional dictation errors in syntax, grammar, or spelling.     PATIENT EDUCATION: Education details: Exercise technique  Person educated: Patient Education method: Explanation, Demonstration, and Verbal cues Education comprehension: verbalized understanding and returned demonstration     HOME EXERCISE PROGRAM: Access Code: [email or web address] URL: [email or web address] Date: 10/07/2021 Prepared by: Sande Brothers   Exercises - Supine Shoulder Flexion AAROM with Hands Clasped  - 7 x weekly - 3 sets - 15 reps - Supine Shoulder Flexion AAROM with Dowel  - 7 x weekly - 3 sets -  15 reps - Supine Shoulder Rhythmic Stabilization- Flexion/Extension  - 7 x weekly - 3 sets - 15 reps - Seated Shoulder External Rotation AAROM with Dowel  - 7 x weekly - 3 sets - 15 reps  NEW 7/20:  Access Code: [email or web address] URL: [email or web address] Date: 11/11/2021 Prepared by: Janna Arch  Exercises - Seated Shoulder Flexion Full Range  - 1 x daily - 7 x weekly - 2 sets - 10 reps - 5 hold - Seated Scapular Retraction  - 1 x daily - 7 x weekly - 2 sets - 10 reps - 5 hold - Seated Shoulder Abduction Full Range  - 1 x daily - 7 x weekly - 2 sets - 10 reps - 5 hold - Seated Elbow Flexion and Extension AROM  - 1 x daily - 7 x weekly - 2 sets - 10 reps - 5 hold - Seated Shoulder Circles  - 1 x daily - 7 x weekly - 2 sets - 10 reps - 5 hold     PT Short Term Goals -                 PT SHORT TERM GOAL #1    Title Patient will be independent in home exercise program to  improve strength/mobility for better functional independence with ADLs.     Baseline 4/24: HEP given 5/30: compelting HEP regularly     Time 4     Period Weeks     Status Achieved     Target Date 09/13/21                     PT Long Term Goals -                PT LONG TERM GOAL #1    Title Patient will increase FOTO score to equal to or greater than   60  to demonstrate statistically significant improvement in mobility and quality of life.     Baseline 4/24: give next session 5/30:38 7/13: 61%     Time 12     Period Weeks     Status MET    Target Date 11/08/21          PT LONG TERM GOAL #2    Title Patient will report a worst pain of 3/10 on VAS in  L shoulder to improve tolerance with ADLs and reduced symptoms with activities.     Baseline 4/24: 10/10 5/30: 3/10 at worst in past few daysworse pain about 2 weeks ago  7/12: 1-2/10     Time 12     Period Weeks     Status MET    Target Date 11/08/21          PT LONG TERM GOAL #3    Title Patient will improve shoulder AROM to > 140 degrees of flexion 12and abduction for improved ability to perform overhead activities.     Baseline 4/24: unable to perform AROM; PROM flexion 102, abduction 95, ER: 15 degrees 7/13: see note    Time 12     Period Weeks     Status On-going     Target Date 01/27/2022           PT LONG TERM GOAL #4    Title Patient will decrease Quick DASH score to >40% demonstrating reduced self-reported upper extremity disability.     Baseline 4/24: 88.6% 5/30:75 7/13: 15%     Time 12     Period Weeks  Status MET    Target Date 11/08/21     PT LONG TERM GOAL #5  Title Patient will improve shoulder MMT to 4+/5 to return to PLOF   Baseline 7/13: waiting physician restriction limitations: grossly 2+/5   Time 12   Period Weeks   Status NEW  Target Date 01/27/2022                  Plan     Clinical Impression Statement Patient continues to demonstrate good progress - able to follow protocol and  progress with reps and resistance without complaint of pain and demo good overall technique. Patient was able to perform scaption with VC and stated challenged but no pain.  Patient will continue to benefit from skilled physical therapy to reduce pain, improve ROM, and return to PLOF.    Personal Factors and Comorbidities Comorbidity 3+;Fitness;Past/Current Experience;Transportation    Comorbidities CAD, NSTEMI, HTN, HLD, angina, hemiparesis s/p stroke, arthritis, insomnia, RLS    Examination-Activity Limitations Bathing;Bed Mobility;Bend;Caring for Others;Carry;Dressing;Hygiene/Grooming;Lift;Locomotion Level;Reach Overhead;Self Feeding;Sit;Transfers;Toileting;Stand    Examination-Participation Restrictions Cleaning;Community Activity;Interpersonal Relationship;Meal Prep;Medication Management;Laundry;Shop;Volunteer;Yard Work    [employer] officer Evolving/Moderate complexity    Clinical Decision Making Moderate    Rehab Potential Fair    PT Frequency 2x / week    PT Duration 12 weeks    PT Treatment/Interventions ADLs/Self Care Home Management;Cryotherapy;Electrical Stimulation;Iontophoresis 71m/ml Dexamethasone;Moist Heat;Traction;Ultrasound;Functional mobility training;Therapeutic activities;Therapeutic exercise;Balance training;Neuromuscular re-education;Manual techniques;Wheelchair mobility training;Patient/family education;Passive range of motion;Dry needling;Energy conservation;Splinting;Taping;Vasopneumatic Device;Vestibular;Visual/perceptual remediation/compensation;Spinal Manipulations;Joint Manipulations    PT Next Visit Plan follow protocol    PT Home Exercise Plan see above; no updates on 10/19/21    Consulted and Agree with Plan of Care Patient                 JDELAWRENCE FRIDMAN PT 11/23/2021, 3:44 PM

## 2021-11-25 ENCOUNTER — Ambulatory Visit: Payer: Medicare Other

## 2021-11-25 DIAGNOSIS — M25612 Stiffness of left shoulder, not elsewhere classified: Secondary | ICD-10-CM

## 2021-11-25 DIAGNOSIS — M79601 Pain in right arm: Secondary | ICD-10-CM

## 2021-11-25 DIAGNOSIS — G8929 Other chronic pain: Secondary | ICD-10-CM

## 2021-11-25 DIAGNOSIS — M6281 Muscle weakness (generalized): Secondary | ICD-10-CM

## 2021-11-25 NOTE — Therapy (Signed)
OUTPATIENT PHYSICAL THERAPY TREATMENT NOTE   Patient Name: Micheal Gonzalez MRN: [id number] DOB:1960-05-26, 61 y.o., male Today's Date: 11/26/2021  PCP: Dereck Ligas, DO REFERRING PROVIDER: Corky Sing, MD   PT End of Session - 11/25/21 1429     Visit Number 26    Number of Visits 3    Date for PT Re-Evaluation 01/27/22    Authorization Type BCBS Medicare    Authorization Time Period 08/16/21-11/08/21    Progress Note Due on Visit 20    PT Start Time 1430    PT Stop Time 1512    PT Time Calculation (min) 42 min    Activity Tolerance Patient tolerated treatment well;No increased pain    Behavior During Therapy [facility] for tasks assessed/performed                  Past Medical History:  Diagnosis Date   Allergy    Arthritis    right foot   Coronary artery disease    Triple Bypass done at [facility] Cardiologist Dr. Concha Pyo   ED (erectile dysfunction)    Exertional angina ([place]) 10/07/3792   Folliculitis barbae    Gait disorder    History of stroke    Right hemiparesis   Hypertension    Insomnia    Other vitamin B12 deficiency anemia 07/11/2013   RLS (restless legs syndrome)    Stroke [facility])    June 2014- partial paralysis on right side   Past Surgical History:  Procedure Laterality Date   CARDIAC CATHETERIZATION N/A 09/08/2014   Procedure: Left Heart Cath and Coronary Angiography;  Surgeon: Sherren Mocha, MD;  Location: [place] [facility];  Service: Cardiovascular;  Laterality: N/A;   COLONOSCOPY  03/25/11   CORONARY ARTERY BYPASS GRAFT  09-15-14   Triple   TRIGGER FINGER RELEASE Left 12/09/2014   Procedure: RELEASE TRIGGER FINGER/A-1 PULLEY LEFT THUMB, INDEX, MIDDLE, RING FINGERS;  Surgeon: Daryll Brod, MD;  Location: [place];  Service: Orthopedics;  Laterality: Left;   Patient Active Problem List   Diagnosis Date Noted   Coronary atherosclerosis of native coronary artery 11/04/2014   NSTEMI (non-ST elevated myocardial infarction) ([place])     Coronary artery disease due to lipid rich plaque    Hyperlipidemia    Essential hypertension    Exertional angina ([place]) 09/08/2014   Unstable angina pectoris ([place]) 09/08/2014   Burning chest pain 08/22/2014   Other vitamin B12 deficiency anemia 07/11/2013   Hemiparesis and alteration of sensations as late effects of stroke ([place]) 07/10/2013   Arthritis 06/25/2013   Insomnia, persistent 12/20/2012   Spastic hemiplegia affecting dominant side ([place]) 12/07/2012   CVA (cerebral infarction) 10/11/2012   Unsteady gait 10/09/2012   HTN (hypertension) 05/31/2012   Family history of CVA 05/17/2012   History of renal stone 05/17/2012   RLS (restless legs syndrome) 01/06/2011   Allergic rhinitis due to pollen 01/06/2011   ED (erectile dysfunction) 01/06/2011   Family history of colon cancer 01/06/2011   Family history of heart disease in male family member before age 41 01/06/2011    REFERRING DIAG: M24.9 (ICD-10-CM) - Joint derangement, unspecified  THERAPY DIAG:  Stiffness of left shoulder, not elsewhere classified  Muscle weakness (generalized)  Chronic left shoulder pain  Pain in right arm   Rationale for Evaluation and Treatment Rehabilitation   PERTINENT HISTORY: Left rotator cuff tendon arthroscopy, tendonesis of long tendon of bicep, debridement. on 08/13/21. Patient has PMH of allergy, arthritis, CAD, ED, angina,  gait disorder, stroke 2014 (R hemiparesis), HTN, insomnia, RLS, having a hard time getting out of bed.   PRECAUTIONS: See Protocol in Chart      Symptoms/Limitations  Subjective Patient reports no new issues and continues to feel good about his left shoulder.   Pertinent History Left rotator cuff tendon arthroscopy, tendonesis of long tendon of bicep, debridement. on 08/13/21. Patient has PMH of allergy, arthritis, CAD, ED, angina, gait disorder, stroke 2014 (R hemiparesis), HTN, insomnia, RLS, having a hard time getting out of bed.  Patient Stated Goals to be  able to use my L arm again  Pain Assessment  Currently in Pain? No/denies   Approved protocol:    2. PT: Continue PT twice weekly.  Continue range of motion exercise: ER with a stick to 70 degrees, overhead flexion with contralateral arm to 160 degrees supine, IR (sleeper stretch). Consider dry needling for the muscle spasm and increased tone which is causing more tightness/stiffness. Improved in clinic when trying contract/relax technique. Continue strengthening program: Bands for ER performed in upright position, dumbbells for scaption - transition from supine to reclined 45 degrees to upright position per protocol.  Goal to achieve 10lbs flexion for 3x10 in one month.  3. Activities:  A) Approved: Biceps curls, triceps press downs/kick backs. May run, bike, use elliptical (without arms) or stairmaster. B) Not approved: Push-ups, dips, pull-ups, overhead press, bench press or power lifting. No swimming or throwing. No contact sports/activities until 6 months.  4. Continue ibuprofen or other anti-inflammatory as needed. Patient to report any GI side effects related to the ibuprofen. May take Tylenol as needed for pain.  5. May return to using cane with left hand as needed.     TODAY'S TREATMENT:    Manual Therapy:  PROM to left shoulder fLex/scaption/abd/ER x 8 min total.   TherEx:  LEFT UE  Supine ER with cane to approx 70 deg 2 sets x 12 reps Supine Shoulder flex to approx 160 deg using wand/cane 2 sets of 12 reps Supine Serratus puches/Scapular protraction/retraction -2 sets of 12 reps  Supine flexion  3# dumbell- 10x, 2 sets Supine abduction (AROM)  10x 2 sets Supine shoulder scaption (AROM) 2 sets of 10 reps    Recline 45 degrees:  4lb dumbell bicep flexion 12x ; x 2 sets   Flexion AROM 10x (no pain reported)  Scaption: AROM 2 sets of 12 rep- VC for correct technique     Standing:  -Shoulder ER with RTB 2 sets of 12 reps.  -Tricep ext press down (matrix cable  system) 7.5 lb 2 sets of 10 reps        Treatment Provided this session    Rationale for Evaluation and Treatment Rehabilitation   Pt educated throughout session about proper posture and technique with exercises. Improved exercise technique, movement at target joints, use of target muscles after min to mod verbal, visual, tactile cues. Note: Portions of this document were prepared using Dragon voice recognition software and although reviewed may contain unintentional dictation errors in syntax, grammar, or spelling.     PATIENT EDUCATION: Education details: Exercise technique  Person educated: Patient Education method: Explanation, Demonstration, and Verbal cues Education comprehension: verbalized understanding and returned demonstration     HOME EXERCISE PROGRAM: Access Code: [email or web address] URL: [email or web address] Date: 10/07/2021 Prepared by: Sande Brothers   Exercises - Supine Shoulder Flexion AAROM with Hands Clasped  - 7 x weekly - 3 sets - 15 reps - Supine Shoulder Flexion AAROM with Dowel  -  7 x weekly - 3 sets - 15 reps - Supine Shoulder Rhythmic Stabilization- Flexion/Extension  - 7 x weekly - 3 sets - 15 reps - Seated Shoulder External Rotation AAROM with Dowel  - 7 x weekly - 3 sets - 15 reps  NEW 7/20:  Access Code: [email or web address] URL: [email or web address] Date: 11/11/2021 Prepared by: Janna Arch  Exercises - Seated Shoulder Flexion Full Range  - 1 x daily - 7 x weekly - 2 sets - 10 reps - 5 hold - Seated Scapular Retraction  - 1 x daily - 7 x weekly - 2 sets - 10 reps - 5 hold - Seated Shoulder Abduction Full Range  - 1 x daily - 7 x weekly - 2 sets - 10 reps - 5 hold - Seated Elbow Flexion and Extension AROM  - 1 x daily - 7 x weekly - 2 sets - 10 reps - 5 hold - Seated Shoulder Circles  - 1 x daily - 7 x weekly - 2 sets - 10 reps - 5 hold     PT Short Term Goals -                 PT SHORT TERM GOAL #1    Title Patient will  be independent in home exercise program to improve strength/mobility for better functional independence with ADLs.     Baseline 4/24: HEP given 5/30: compelting HEP regularly     Time 4     Period Weeks     Status Achieved     Target Date 09/13/21                     PT Long Term Goals -                PT LONG TERM GOAL #1    Title Patient will increase FOTO score to equal to or greater than   60  to demonstrate statistically significant improvement in mobility and quality of life.     Baseline 4/24: give next session 5/30:38 7/13: 61%     Time 12     Period Weeks     Status MET    Target Date 11/08/21          PT LONG TERM GOAL #2    Title Patient will report a worst pain of 3/10 on VAS in  L shoulder to improve tolerance with ADLs and reduced symptoms with activities.     Baseline 4/24: 10/10 5/30: 3/10 at worst in past few daysworse pain about 2 weeks ago  7/12: 1-2/10     Time 12     Period Weeks     Status MET    Target Date 11/08/21          PT LONG TERM GOAL #3    Title Patient will improve shoulder AROM to > 140 degrees of flexion 12and abduction for improved ability to perform overhead activities.     Baseline 4/24: unable to perform AROM; PROM flexion 102, abduction 95, ER: 15 degrees 7/13: see note    Time 12     Period Weeks     Status On-going     Target Date 01/27/2022           PT LONG TERM GOAL #4    Title Patient will decrease Quick DASH score to >40% demonstrating reduced self-reported upper extremity disability.     Baseline 4/24: 88.6% 5/30:75 7/13: 15%     Time 12  Period Weeks     Status MET    Target Date 11/08/21     PT LONG TERM GOAL #5  Title Patient will improve shoulder MMT to 4+/5 to return to PLOF   Baseline 7/13: waiting physician restriction limitations: grossly 2+/5   Time 12   Period Weeks   Status NEW  Target Date 01/27/2022                  Plan     Clinical Impression Statement Patient performed well today- 160  deg of passive Left UE ROM and continues to perform both active and some resistive UE strengthening without UT compensation. He is able to complete exercises per protocol well without any significant difficulty.  Patient will continue to benefit from skilled physical therapy to reduce pain, improve ROM, and return to PLOF.    Personal Factors and Comorbidities Comorbidity 3+;Fitness;Past/Current Experience;Transportation    Comorbidities CAD, NSTEMI, HTN, HLD, angina, hemiparesis s/p stroke, arthritis, insomnia, RLS    Examination-Activity Limitations Bathing;Bed Mobility;Bend;Caring for Others;Carry;Dressing;Hygiene/Grooming;Lift;Locomotion Level;Reach Overhead;Self Feeding;Sit;Transfers;Toileting;Stand    Examination-Participation Restrictions Cleaning;Community Activity;Interpersonal Relationship;Meal Prep;Medication Management;Laundry;Shop;Volunteer;Yard Work    [employer] officer Evolving/Moderate complexity    Clinical Decision Making Moderate    Rehab Potential Fair    PT Frequency 2x / week    PT Duration 12 weeks    PT Treatment/Interventions ADLs/Self Care Home Management;Cryotherapy;Electrical Stimulation;Iontophoresis 35m/ml Dexamethasone;Moist Heat;Traction;Ultrasound;Functional mobility training;Therapeutic activities;Therapeutic exercise;Balance training;Neuromuscular re-education;Manual techniques;Wheelchair mobility training;Patient/family education;Passive range of motion;Dry needling;Energy conservation;Splinting;Taping;Vasopneumatic Device;Vestibular;Visual/perceptual remediation/compensation;Spinal Manipulations;Joint Manipulations    PT Next Visit Plan follow protocol    PT Home Exercise Plan see above; no updates on 10/19/21    Consulted and Agree with Plan of Care Patient                 JLASTER APPLING PT 11/26/2021, 2:39 PM

## 2021-11-30 ENCOUNTER — Ambulatory Visit: Payer: Medicare Other

## 2021-11-30 DIAGNOSIS — M6281 Muscle weakness (generalized): Secondary | ICD-10-CM

## 2021-11-30 DIAGNOSIS — G8929 Other chronic pain: Secondary | ICD-10-CM

## 2021-11-30 DIAGNOSIS — M25612 Stiffness of left shoulder, not elsewhere classified: Secondary | ICD-10-CM | POA: Diagnosis not present

## 2021-11-30 NOTE — Therapy (Signed)
OUTPATIENT PHYSICAL THERAPY TREATMENT NOTE   Patient Name: Micheal Gonzalez MRN: [id number] DOB:[birth year], 61 y.o., male Today's Date: 11/30/2021  PCP: Dereck Ligas, DO REFERRING PROVIDER: Corky Sing, MD   PT End of Session - 11/30/21 1415     Visit Number 27    Number of Visits 6    Date for PT Re-Evaluation 01/27/22    Authorization Type BCBS Medicare    Authorization Time Period 08/16/21-11/08/21    Progress Note Due on Visit 20    PT Start Time 1430    PT Stop Time 1514    PT Time Calculation (min) 44 min    Activity Tolerance Patient tolerated treatment well;No increased pain    Behavior During Therapy [facility] for tasks assessed/performed                   Past Medical History:  Diagnosis Date   Allergy    Arthritis    right foot   Coronary artery disease    Triple Bypass done at [facility] Cardiologist Dr. Concha Pyo   ED (erectile dysfunction)    Exertional angina ([place]) 08/01/8117   Folliculitis barbae    Gait disorder    History of stroke    Right hemiparesis   Hypertension    Insomnia    Other vitamin B12 deficiency anemia 07/11/2013   RLS (restless legs syndrome)    Stroke [facility])    June 2014- partial paralysis on right side   Past Surgical History:  Procedure Laterality Date   CARDIAC CATHETERIZATION N/A 09/08/2014   Procedure: Left Heart Cath and Coronary Angiography;  Surgeon: Sherren Mocha, MD;  Location: [place] CV LAB;  Service: Cardiovascular;  Laterality: N/A;   COLONOSCOPY  03/25/11   CORONARY ARTERY BYPASS GRAFT  09-15-14   Triple   TRIGGER FINGER RELEASE Left 12/09/2014   Procedure: RELEASE TRIGGER FINGER/A-1 PULLEY LEFT THUMB, INDEX, MIDDLE, RING FINGERS;  Surgeon: Daryll Brod, MD;  Location: [place];  Service: Orthopedics;  Laterality: Left;   Patient Active Problem List   Diagnosis Date Noted   Coronary atherosclerosis of native coronary artery 11/04/2014   NSTEMI (non-ST elevated myocardial infarction) ([place])     Coronary artery disease due to lipid rich plaque    Hyperlipidemia    Essential hypertension    Exertional angina ([place]) 09/08/2014   Unstable angina pectoris ([place]) 09/08/2014   Burning chest pain 08/22/2014   Other vitamin B12 deficiency anemia 07/11/2013   Hemiparesis and alteration of sensations as late effects of stroke ([place]) 07/10/2013   Arthritis 06/25/2013   Insomnia, persistent 12/20/2012   Spastic hemiplegia affecting dominant side ([place]) 12/07/2012   CVA (cerebral infarction) 10/11/2012   Unsteady gait 10/09/2012   HTN (hypertension) 05/31/2012   Family history of CVA 05/17/2012   History of renal stone 05/17/2012   RLS (restless legs syndrome) 01/06/2011   Allergic rhinitis due to pollen 01/06/2011   ED (erectile dysfunction) 01/06/2011   Family history of colon cancer 01/06/2011   Family history of heart disease in male family member before age 87 01/06/2011    REFERRING DIAG: M24.9 (ICD-10-CM) - Joint derangement, unspecified  THERAPY DIAG:  Stiffness of left shoulder, not elsewhere classified  Muscle weakness (generalized)  Chronic left shoulder pain   Rationale for Evaluation and Treatment Rehabilitation   PERTINENT HISTORY: Left rotator cuff tendon arthroscopy, tendonesis of long tendon of bicep, debridement. on 08/13/21. Patient has PMH of allergy, arthritis, CAD, ED, angina, gait disorder, stroke 2014 (  R hemiparesis), HTN, insomnia, RLS, having a hard time getting out of bed.   PRECAUTIONS: See Protocol in Chart      Symptoms/Limitations  Subjective Patient reports no falls or LOB. Went to [place] over the weekend to eat at some restaurants. Has been compliant with HEP.   Pertinent History Left rotator cuff tendon arthroscopy, tendonesis of long tendon of bicep, debridement. on 08/13/21. Patient has PMH of allergy, arthritis, CAD, ED, angina, gait disorder, stroke 2014 (R hemiparesis), HTN, insomnia, RLS, having a hard time getting out of bed.  Patient  Stated Goals to be able to use my L arm again  Pain Assessment  Currently in Pain? No/denies   Approved protocol:    2. PT: Continue PT twice weekly.  Continue range of motion exercise: ER with a stick to 70 degrees, overhead flexion with contralateral arm to 160 degrees supine, IR (sleeper stretch). Consider dry needling for the muscle spasm and increased tone which is causing more tightness/stiffness. Improved in clinic when trying contract/relax technique. Continue strengthening program: Bands for ER performed in upright position, dumbbells for scaption - transition from supine to reclined 45 degrees to upright position per protocol.  Goal to achieve 10lbs flexion for 3x10 in one month.  3. Activities:  A) Approved: Biceps curls, triceps press downs/kick backs. May run, bike, use elliptical (without arms) or stairmaster. B) Not approved: Push-ups, dips, pull-ups, overhead press, bench press or power lifting. No swimming or throwing. No contact sports/activities until 6 months.  4. Continue ibuprofen or other anti-inflammatory as needed. Patient to report any GI side effects related to the ibuprofen. May take Tylenol as needed for pain.  5. May return to using cane with left hand as needed.     TODAY'S TREATMENT:    Manual Therapy:  PROM to left shoulder fLex/scaption/abd/ER x 8 min total.   TherEx:  LEFT UE Supine Serratus puches/Scapular protraction/retraction -2 sets of 12 reps  Supine flexion  3# dumbell- 10x, 2 sets Supine abduction (AROM)  10x 2 sets   Recline 45 degrees:  4lb dumbell bicep flexion 12x ; x 2 sets   Scaption: AROM 2 sets of 12 rep- VC for correct technique  Seated:  SciFit lvl 1 x2 minutes forward, 2 minutes backwards ; focus on slow controlled movement  Scapular squeeze 15x 5 second holds -leg slides 15x -head taps 15x     Standing:  -Shoulder ER with RTB 2 sets of 12 reps.  -Tricep ext press down (matrix cable system) 7.5 lb 2 sets of 10  reps -wall walks 5x flexion       Treatment Provided this session    Rationale for Evaluation and Treatment Rehabilitation   Pt educated throughout session about proper posture and technique with exercises. Improved exercise technique, movement at target joints, use of target muscles after min to mod verbal, visual, tactile cues. Note: Portions of this document were prepared using Dragon voice recognition software and although reviewed may contain unintentional dictation errors in syntax, grammar, or spelling.     PATIENT EDUCATION: Education details: Exercise technique  Person educated: Patient Education method: Explanation, Demonstration, and Verbal cues Education comprehension: verbalized understanding and returned demonstration     HOME EXERCISE PROGRAM: Access Code: [email or web address] URL: [email or web address] Date: 10/07/2021 Prepared by: Sande Brothers   Exercises - Supine Shoulder Flexion AAROM with Hands Clasped  - 7 x weekly - 3 sets - 15 reps - Supine Shoulder Flexion AAROM with Dowel  - 7 x weekly - 3  sets - 15 reps - Supine Shoulder Rhythmic Stabilization- Flexion/Extension  - 7 x weekly - 3 sets - 15 reps - Seated Shoulder External Rotation AAROM with Dowel  - 7 x weekly - 3 sets - 15 reps  NEW 7/20:  Access Code: [email or web address] URL: [email or web address] Date: 11/11/2021 Prepared by: Janna Arch  Exercises - Seated Shoulder Flexion Full Range  - 1 x daily - 7 x weekly - 2 sets - 10 reps - 5 hold - Seated Scapular Retraction  - 1 x daily - 7 x weekly - 2 sets - 10 reps - 5 hold - Seated Shoulder Abduction Full Range  - 1 x daily - 7 x weekly - 2 sets - 10 reps - 5 hold - Seated Elbow Flexion and Extension AROM  - 1 x daily - 7 x weekly - 2 sets - 10 reps - 5 hold - Seated Shoulder Circles  - 1 x daily - 7 x weekly - 2 sets - 10 reps - 5 hold     PT Short Term Goals -                 PT SHORT TERM GOAL #1    Title Patient will be  independent in home exercise program to improve strength/mobility for better functional independence with ADLs.     Baseline 4/24: HEP given 5/30: compelting HEP regularly     Time 4     Period Weeks     Status Achieved     Target Date 09/13/21                     PT Long Term Goals -                PT LONG TERM GOAL #1    Title Patient will increase FOTO score to equal to or greater than   60  to demonstrate statistically significant improvement in mobility and quality of life.     Baseline 4/24: give next session 5/30:38 7/13: 61%     Time 12     Period Weeks     Status MET    Target Date 11/08/21          PT LONG TERM GOAL #2    Title Patient will report a worst pain of 3/10 on VAS in  L shoulder to improve tolerance with ADLs and reduced symptoms with activities.     Baseline 4/24: 10/10 5/30: 3/10 at worst in past few daysworse pain about 2 weeks ago  7/12: 1-2/10     Time 12     Period Weeks     Status MET    Target Date 11/08/21          PT LONG TERM GOAL #3    Title Patient will improve shoulder AROM to > 140 degrees of flexion 12and abduction for improved ability to perform overhead activities.     Baseline 4/24: unable to perform AROM; PROM flexion 102, abduction 95, ER: 15 degrees 7/13: see note    Time 12     Period Weeks     Status On-going     Target Date 01/27/2022           PT LONG TERM GOAL #4    Title Patient will decrease Quick DASH score to >40% demonstrating reduced self-reported upper extremity disability.     Baseline 4/24: 88.6% 5/30:75 7/13: 15%     Time 12     Period  Weeks     Status MET    Target Date 11/08/21     PT LONG TERM GOAL #5  Title Patient will improve shoulder MMT to 4+/5 to return to PLOF   Baseline 7/13: waiting physician restriction limitations: grossly 2+/5   Time 12   Period Weeks   Status NEW  Target Date 01/27/2022                  Plan     Clinical Impression Statement Patient presents with excellent  motivation throughout session. Introduced to ScitFIt on no resistance for ROM progression. He has increasing strength and ROM with decreased frequency of pain.   Patient will continue to benefit from skilled physical therapy to reduce pain, improve ROM, and return to PLOF.    Personal Factors and Comorbidities Comorbidity 3+;Fitness;Past/Current Experience;Transportation    Comorbidities CAD, NSTEMI, HTN, HLD, angina, hemiparesis s/p stroke, arthritis, insomnia, RLS    Examination-Activity Limitations Bathing;Bed Mobility;Bend;Caring for Others;Carry;Dressing;Hygiene/Grooming;Lift;Locomotion Level;Reach Overhead;Self Feeding;Sit;Transfers;Toileting;Stand    Examination-Participation Restrictions Cleaning;Community Activity;Interpersonal Relationship;Meal Prep;Medication Management;Laundry;Shop;Volunteer;Yard Work    [employer]chant [employer] officer Evolving/Moderate complexity    Clinical Decision Making Moderate    Rehab Potential Fair    PT Frequency 2x / week    PT Duration 12 weeks    PT Treatment/Interventions ADLs/Self Care Home Management;Cryotherapy;Electrical Stimulation;Iontophoresis 78m/ml Dexamethasone;Moist Heat;Traction;Ultrasound;Functional mobility training;Therapeutic activities;Therapeutic exercise;Balance training;Neuromuscular re-education;Manual techniques;Wheelchair mobility training;Patient/family education;Passive range of motion;Dry needling;Energy conservation;Splinting;Taping;Vasopneumatic Device;Vestibular;Visual/perceptual remediation/compensation;Spinal Manipulations;Joint Manipulations    PT Next Visit Plan follow protocol    PT Home Exercise Plan see above; no updates on 10/19/21    Consulted and Agree with Plan of Care Patient                 MJanna Arch PT 11/30/2021, 3:14 PM

## 2021-12-02 ENCOUNTER — Ambulatory Visit: Payer: Medicare Other

## 2021-12-02 DIAGNOSIS — M25612 Stiffness of left shoulder, not elsewhere classified: Secondary | ICD-10-CM | POA: Diagnosis not present

## 2021-12-02 DIAGNOSIS — G8929 Other chronic pain: Secondary | ICD-10-CM

## 2021-12-02 DIAGNOSIS — M25512 Pain in left shoulder: Secondary | ICD-10-CM

## 2021-12-02 DIAGNOSIS — M6281 Muscle weakness (generalized): Secondary | ICD-10-CM

## 2021-12-02 NOTE — Therapy (Addendum)
OUTPATIENT PHYSICAL THERAPY TREATMENT NOTE   Patient Name: Micheal Gonzalez MRN: [id number] DOB:November 29, 1960, 61 y.o., male Today's Date: 12/02/2021  PCP: Dereck Ligas, DO REFERRING PROVIDER: Belva Chimes, MD   PT End of Session - 12/02/21 1444     Visit Number 28    Number of Visits 44    Date for PT Re-Evaluation 01/27/22    Authorization Type BCBS Medicare    PT Start Time 7341    PT Stop Time 1515    PT Time Calculation (min) 38 min    Equipment Utilized During Treatment Other (comment)    Activity Tolerance Patient tolerated treatment well;No increased pain    Behavior During Therapy [facility] for tasks assessed/performed                   Past Medical History:  Diagnosis Date   Allergy    Arthritis    right foot   Coronary artery disease    Triple Bypass done at [facility] Cardiologist Dr. Concha Pyo   ED (erectile dysfunction)    Exertional angina ([place]) 9/37/9024   Folliculitis barbae    Gait disorder    History of stroke    Right hemiparesis   Hypertension    Insomnia    Other vitamin B12 deficiency anemia 07/11/2013   RLS (restless legs syndrome)    Stroke [facility])    June 2014- partial paralysis on right side   Past Surgical History:  Procedure Laterality Date   CARDIAC CATHETERIZATION N/A 09/08/2014   Procedure: Left Heart Cath and Coronary Angiography;  Surgeon: Sherren Mocha, MD;  Location: [place] CV LAB;  Service: Cardiovascular;  Laterality: N/A;   COLONOSCOPY  03/25/11   CORONARY ARTERY BYPASS GRAFT  09-15-14   Triple   TRIGGER FINGER RELEASE Left 12/09/2014   Procedure: RELEASE TRIGGER FINGER/A-1 PULLEY LEFT THUMB, INDEX, MIDDLE, RING FINGERS;  Surgeon: Daryll Brod, MD;  Location: [place];  Service: Orthopedics;  Laterality: Left;   Patient Active Problem List   Diagnosis Date Noted   Coronary atherosclerosis of native coronary artery 11/04/2014   NSTEMI (non-ST elevated myocardial infarction) ([place])    Coronary artery  disease due to lipid rich plaque    Hyperlipidemia    Essential hypertension    Exertional angina ([place]) 09/08/2014   Unstable angina pectoris ([place]) 09/08/2014   Burning chest pain 08/22/2014   Other vitamin B12 deficiency anemia 07/11/2013   Hemiparesis and alteration of sensations as late effects of stroke ([place]) 07/10/2013   Arthritis 06/25/2013   Insomnia, persistent 12/20/2012   Spastic hemiplegia affecting dominant side ([place]) 12/07/2012   CVA (cerebral infarction) 10/11/2012   Unsteady gait 10/09/2012   HTN (hypertension) 05/31/2012   Family history of CVA 05/17/2012   History of renal stone 05/17/2012   RLS (restless legs syndrome) 01/06/2011   Allergic rhinitis due to pollen 01/06/2011   ED (erectile dysfunction) 01/06/2011   Family history of colon cancer 01/06/2011   Family history of heart disease in male family member before age 35 01/06/2011    REFERRING DIAG: M24.9 (ICD-10-CM) - Joint derangement, unspecified  THERAPY DIAG:  Stiffness of left shoulder, not elsewhere classified  Muscle weakness (generalized)  Chronic left shoulder pain  Acute pain of left shoulder   Rationale for Evaluation and Treatment Rehabilitation   PERTINENT HISTORY: Left rotator cuff tendon arthroscopy, tendonesis of long tendon of bicep, debridement. on 08/13/21. Patient has PMH of allergy, arthritis, CAD, ED, angina, gait disorder, stroke 2014 (R  hemiparesis), HTN, insomnia, RLS, having a hard time getting out of bed.   PRECAUTIONS: See Protocol in Chart      Symptoms/Limitations  Subjective Pt reports pain remains well controlled. HEP is performed without issue. He reports he is nearing ready for DC, plans on DC from PT after his FU with MD on 8/21. He denies any current perception of limitation in the LUE regarding ROM deficits or strength deficits in his typical ADL performance.   Pertinent History Left rotator cuff tendon arthroscopy, tendonesis of long tendon of bicep,  debridement. on 08/13/21. Patient has PMH of allergy, arthritis, CAD, ED, angina, gait disorder, stroke 2014 (R hemiparesis), HTN, insomnia, RLS, having a hard time getting out of bed.  Patient Stated Goals to be able to use my L arm again  Pain Assessment  Currently in Pain? No/denies    Amador Cunas, MD - 11/15/2021 10:40 AM EDT   Formatting of this note might be different from the original. 1. Continue the post-operative protocol as instructed.  2. PT: Continue PT twice weekly.  Continue range of motion exercise: ER with a stick to 70 degrees, overhead flexion with contralateral arm to 160 degrees supine, IR (sleeper stretch). Consider dry needling for the muscle spasm and increased tone which is causing more tightness/stiffness. Improved in clinic when trying contract/relax technique. Continue strengthening program: Bands for ER performed in upright position, dumbbells for scaption - transition from supine to reclined 45 degrees to upright position per protocol.  Goal to achieve 10lbs flexion for 3x10 in one month.  3. Activities:  A) Approved: Biceps curls, triceps press downs/kick backs. May run, bike, use elliptical (without arms) or stairmaster. B) Not approved: Push-ups, dips, pull-ups, overhead press, bench press or power lifting. No swimming or throwing. No contact sports/activities until 6 months.  4. Continue ibuprofen or other anti-inflammatory as needed. Patient to report any GI side effects related to the ibuprofen. May take Tylenol as needed for pain.  5. May return to using cane with left hand as needed.  6. Return to clinic in 4 weeks. Electronically signed by Amador Cunas, MD at 11/15/2021 11:28 AM EDT    TODAY'S TREATMENT:  Manual Therapy:  P/ROM: discontinued- pt now performing resisted exercises, can perform A/ROM independently now and will plan on DC in 11 days.   A/ROM:  -Supine Left shoulder A/ROM 1x15 each: Left chest press, Left shoulder  flexion to end range, left ABDCT (snow angle)  Resistance training:  -supine LUE chest press 1x10 @ 4lb -LUE isometric shoulder extension into plinth 15x3s -supine LUE chest press 1x10 @ 7lb  -seated downward cable row LUE 1x12 @ 17.5lb -seated Left elbow flexion 1x12 @ 4lb  -tricep ext press down (matrix cable system) 7.5 lb 2 sets of 10 reps  -seated downward cable row LUE 1x12 @ 17.5  -seated Left elbow flexion 1x12 @ 4lb  -tricep ext press down (matrix cable system) 7.5 lb 2 sets of 10 reps  -standing LUE flexion to 90 degrees 4lb FW 1x10 -standing LUE ABDCT unloaded  0lb 1x10  -standing LUE flexion to 90 degrees 4lb FW 1x10 -standing LUE ABDCT unloaded  0lb 1x10      Rationale for Evaluation and Treatment Rehabilitation   Pt educated throughout session about proper posture and technique with exercises. Improved exercise technique, movement at target joints, use of target muscles after min to mod verbal, visual, tactile cues. Note: Portions of this document were prepared using Systems analyst  and although reviewed may contain unintentional dictation errors in syntax, grammar, or spelling.     PATIENT EDUCATION: Education details: Exercise technique  Person educated: Patient Education method: Explanation, Demonstration, and Verbal cues Education comprehension: verbalized understanding and returned demonstration     HOME EXERCISE PROGRAM: Access Code: [email or web address] URL: [email or web address] Date: 10/07/2021 Prepared by: Sande Brothers   Exercises - Supine Shoulder Flexion AAROM with Hands Clasped  - 7 x weekly - 3 sets - 15 reps - Supine Shoulder Flexion AAROM with Dowel  - 7 x weekly - 3 sets - 15 reps - Supine Shoulder Rhythmic Stabilization- Flexion/Extension  - 7 x weekly - 3 sets - 15 reps - Seated Shoulder External Rotation AAROM with Dowel  - 7 x weekly - 3 sets - 15 reps  NEW 7/20:  Access Code: [email or web address] URL:  [email or web address] Date: 11/11/2021 Prepared by: Janna Arch  Exercises - Seated Shoulder Flexion Full Range  - 1 x daily - 7 x weekly - 2 sets - 10 reps - 5 hold - Seated Scapular Retraction  - 1 x daily - 7 x weekly - 2 sets - 10 reps - 5 hold - Seated Shoulder Abduction Full Range  - 1 x daily - 7 x weekly - 2 sets - 10 reps - 5 hold - Seated Elbow Flexion and Extension AROM  - 1 x daily - 7 x weekly - 2 sets - 10 reps - 5 hold - Seated Shoulder Circles  - 1 x daily - 7 x weekly - 2 sets - 10 reps - 5 hold     PT Short Term Goals -                 PT SHORT TERM GOAL #1    Title Patient will be independent in home exercise program to improve strength/mobility for better functional independence with ADLs.     Baseline 4/24: HEP given 5/30: compelting HEP regularly     Time 4     Period Weeks     Status Achieved     Target Date 09/13/21                     PT Long Term Goals -                PT LONG TERM GOAL #1    Title Patient will increase FOTO score to equal to or greater than 60  to demonstrate statistically significant improvement in mobility and quality of life.     Baseline 4/24: give next session 5/30:38 7/13: 61%     Time 12     Period Weeks     Status MET    Target Date 11/08/21          PT LONG TERM GOAL #2    Title Patient will report a worst pain of 3/10 on VAS in  L shoulder to improve tolerance with ADLs and reduced symptoms with activities.     Baseline 4/24: 10/10 5/30: 3/10 at worst in past few daysworse pain about 2 weeks ago  7/12: 1-2/10     Time 12     Period Weeks     Status MET    Target Date 11/08/21          PT LONG TERM GOAL #3    Title Patient will improve shoulder AROM to > 140 degrees of flexion 12and abduction for improved ability to perform overhead activities.     Baseline 4/24:  unable to perform AROM; PROM flexion 102, abduction 95, ER: 15 degrees 7/13: see note    Time 12     Period Weeks     Status On-going      Target Date 01/27/2022           PT LONG TERM GOAL #4    Title Patient will decrease Quick DASH score to >40% demonstrating reduced self-reported upper extremity disability.     Baseline 4/24: 88.6% 5/30:75 7/13: 15%     Time 12     Period Weeks     Status MET    Target Date 11/08/21     PT LONG TERM GOAL #5  Title Patient will improve shoulder MMT to 4+/5 to return to PLOF   Baseline 7/13: waiting physician restriction limitations: grossly 2+/5   Time 12   Period Weeks   Status NEW  Target Date 01/27/2022                  Plan     Clinical Impression Statement Pt reports feeling ready for DC from PT, reports he and MD have agreed upon DC after his next FU visit. Continued with progression toward DC level training. Pt able to perform A/ROM on his own now, DC from P/ROM. Resistance interventions are well tolerated and performed correctly as cued. Pt denies any specific areas of activity participation restriction related to leisure or IADL performance. Patient will continue to benefit from skilled physical therapy to reduce pain, improve ROM, and return to PLOF.    Personal Factors and Comorbidities Comorbidity 3+;Fitness;Past/Current Experience;Transportation    Comorbidities CAD, NSTEMI, HTN, HLD, angina, hemiparesis s/p stroke, arthritis, insomnia, RLS    Examination-Activity Limitations Bathing;Bed Mobility;Bend;Caring for Others;Carry;Dressing;Hygiene/Grooming;Lift;Locomotion Level;Reach Overhead;Self Feeding;Sit;Transfers;Toileting;Stand    Examination-Participation Restrictions Cleaning;Community Activity;Interpersonal Relationship;Meal Prep;Medication Management;Laundry;Shop;Volunteer;Yard Work    Merchant [employer] officer Evolving/Moderate complexity    Clinical Decision Making Moderate    Rehab Potential Fair    PT Frequency 2x / week    PT Duration 12 weeks    PT Treatment/Interventions ADLs/Self Care Home Management;Cryotherapy;Electrical  Stimulation;Iontophoresis 53m/ml Dexamethasone;Moist Heat;Traction;Ultrasound;Functional mobility training;Therapeutic activities;Therapeutic exercise;Balance training;Neuromuscular re-education;Manual techniques;Wheelchair mobility training;Patient/family education;Passive range of motion;Dry needling;Energy conservation;Splinting;Taping;Vasopneumatic Device;Vestibular;Visual/perceptual remediation/compensation;Spinal Manipulations;Joint Manipulations    PT Next Visit Plan follow protocol    PT Home Exercise Plan see above; no updates on 10/19/21    Consulted and Agree with Plan of Care Patient                 BEtta Grandchild PT 12/02/2021, 2:55 PM  2:55 PM, 12/02/21 AEtta Grandchild PT, DPT Physical Therapist - [phone]

## 2021-12-06 NOTE — Therapy (Signed)
OUTPATIENT PHYSICAL THERAPY TREATMENT NOTE   Patient Name: Micheal Gonzalez MRN: [id number] DOB:17-May-1960, 61 y.o., male Today's Date: 12/07/2021  PCP: Dereck Ligas, DO REFERRING PROVIDER: Belva Chimes, MD   PT End of Session - 12/07/21 1433     Visit Number 29    Number of Visits 44    Date for PT Re-Evaluation 01/27/22    Authorization Type BCBS Medicare; next session discharge    PT Start Time 1430    PT Stop Time 1514    PT Time Calculation (min) 44 min    Equipment Utilized During Treatment Other (comment)    Activity Tolerance Patient tolerated treatment well;No increased pain    Behavior During Therapy [facility] for tasks assessed/performed                    Past Medical History:  Diagnosis Date   Allergy    Arthritis    right foot   Coronary artery disease    Triple Bypass done at [facility] Cardiologist Dr. Concha Pyo   ED (erectile dysfunction)    Exertional angina ([place]) 4/00/8676   Folliculitis barbae    Gait disorder    History of stroke    Right hemiparesis   Hypertension    Insomnia    Other vitamin B12 deficiency anemia 07/11/2013   RLS (restless legs syndrome)    Stroke [facility])    June 2014- partial paralysis on right side   Past Surgical History:  Procedure Laterality Date   CARDIAC CATHETERIZATION N/A 09/08/2014   Procedure: Left Heart Cath and Coronary Angiography;  Surgeon: Sherren Mocha, MD;  Location: [place] [facility];  Service: Cardiovascular;  Laterality: N/A;   COLONOSCOPY  03/25/11   CORONARY ARTERY BYPASS GRAFT  09-15-14   Triple   TRIGGER FINGER RELEASE Left 12/09/2014   Procedure: RELEASE TRIGGER FINGER/A-1 PULLEY LEFT THUMB, INDEX, MIDDLE, RING FINGERS;  Surgeon: Daryll Brod, MD;  Location: [place];  Service: Orthopedics;  Laterality: Left;   Patient Active Problem List   Diagnosis Date Noted   Coronary atherosclerosis of native coronary artery 11/04/2014   NSTEMI (non-ST elevated myocardial infarction)  ([place])    Coronary artery disease due to lipid rich plaque    Hyperlipidemia    Essential hypertension    Exertional angina ([place]) 09/08/2014   Unstable angina pectoris ([place]) 09/08/2014   Burning chest pain 08/22/2014   Other vitamin B12 deficiency anemia 07/11/2013   Hemiparesis and alteration of sensations as late effects of stroke ([place]) 07/10/2013   Arthritis 06/25/2013   Insomnia, persistent 12/20/2012   Spastic hemiplegia affecting dominant side ([place]) 12/07/2012   CVA (cerebral infarction) 10/11/2012   Unsteady gait 10/09/2012   HTN (hypertension) 05/31/2012   Family history of CVA 05/17/2012   History of renal stone 05/17/2012   RLS (restless legs syndrome) 01/06/2011   Allergic rhinitis due to pollen 01/06/2011   ED (erectile dysfunction) 01/06/2011   Family history of colon cancer 01/06/2011   Family history of heart disease in male family member before age 7 01/06/2011    REFERRING DIAG: M24.9 (ICD-10-CM) - Joint derangement, unspecified  THERAPY DIAG:  Stiffness of left shoulder, not elsewhere classified  Muscle weakness (generalized)  Chronic left shoulder pain  Acute pain of left shoulder   Rationale for Evaluation and Treatment Rehabilitation   PERTINENT HISTORY: Left rotator cuff tendon arthroscopy, tendonesis of long tendon of bicep, debridement. on 08/13/21. Patient has PMH of allergy, arthritis, CAD, ED, angina, gait  disorder, stroke 2014 (R hemiparesis), HTN, insomnia, RLS, having a hard time getting out of bed.   PRECAUTIONS: See Protocol in Chart      Symptoms/Limitations  Subjective Patient aware that next session will be discharge.   Pertinent History Left rotator cuff tendon arthroscopy, tendonesis of long tendon of bicep, debridement. on 08/13/21. Patient has PMH of allergy, arthritis, CAD, ED, angina, gait disorder, stroke 2014 (R hemiparesis), HTN, insomnia, RLS, having a hard time getting out of bed.  Patient Stated Goals to be able to use my L  arm again  Pain Assessment  Currently in Pain? No/denies    Amador Cunas, MD - 11/15/2021 10:40 AM EDT   Formatting of this note might be different from the original. 1. Continue the post-operative protocol as instructed.  2. PT: Continue PT twice weekly.  Continue range of motion exercise: ER with a stick to 70 degrees, overhead flexion with contralateral arm to 160 degrees supine, IR (sleeper stretch). Consider dry needling for the muscle spasm and increased tone which is causing more tightness/stiffness. Improved in clinic when trying contract/relax technique. Continue strengthening program: Bands for ER performed in upright position, dumbbells for scaption - transition from supine to reclined 45 degrees to upright position per protocol.  Goal to achieve 10lbs flexion for 3x10 in one month.  3. Activities:  A) Approved: Biceps curls, triceps press downs/kick backs. May run, bike, use elliptical (without arms) or stairmaster. B) Not approved: Push-ups, dips, pull-ups, overhead press, bench press or power lifting. No swimming or throwing. No contact sports/activities until 6 months.  4. Continue ibuprofen or other anti-inflammatory as needed. Patient to report any GI side effects related to the ibuprofen. May take Tylenol as needed for pain.  5. May return to using cane with left hand as needed.  6. Return to clinic in 4 weeks. Electronically signed by Amador Cunas, MD at 11/15/2021 11:28 AM EDT    TODAY'S TREATMENT:      TherEx:  LEFT UE Supine Serratus puches/Scapular protraction/retraction -2 sets of 10 reps with #4 dumbbell Scapular stabilization against pertubations x 10 seconds x 2 trials  Supine flexion  4# dumbell- 12x, 2 sets Supine abduction (AROM)  10x 2 sets     Recline 45 degrees:  4lb dumbell bicep flexion 12x ; x 2 sets   Preacher curl 4lb dumbbell 12x; x2 sets Abduction 10x no weight x2 sets Bent arm abduction with weight #4; 10x ; x 2 sets   Scaption: AROM 2 sets of 12 rep- VC for correct technique   Seated:  SciFit lvl 1 x2 minutes forward, 2 minutes backwards ; focus on slow controlled movement  GTB ER 10x; 2 sets GTB IR 10x 2 sets GTB row 10x 5 second holds x 2 sets Flexion circles clockwise 10x, counterclockwise 10x  Abduction circles clockwise 10x, counterclockwise 10x  leg slides 15x head taps 15x                          Rationale for Evaluation and Treatment Rehabilitation   Pt educated throughout session about proper posture and technique with exercises. Improved exercise technique, movement at target joints, use of target muscles after min to mod verbal, visual, tactile cues.  PATIENT EDUCATION: Education details: Exercise technique  Person educated: Patient Education method: Explanation, Demonstration, and Verbal cues Education comprehension: verbalized understanding and returned demonstration     HOME EXERCISE PROGRAM: Access Code: [email or web address] URL: [email or web address] Date: 10/07/2021 Prepared  by: Sande Brothers   Exercises - Supine Shoulder Flexion AAROM with Hands Clasped  - 7 x weekly - 3 sets - 15 reps - Supine Shoulder Flexion AAROM with Dowel  - 7 x weekly - 3 sets - 15 reps - Supine Shoulder Rhythmic Stabilization- Flexion/Extension  - 7 x weekly - 3 sets - 15 reps - Seated Shoulder External Rotation AAROM with Dowel  - 7 x weekly - 3 sets - 15 reps  NEW 7/20:  Access Code: [email or web address] URL: [email or web address] Date: 11/11/2021 Prepared by: Janna Arch  Exercises - Seated Shoulder Flexion Full Range  - 1 x daily - 7 x weekly - 2 sets - 10 reps - 5 hold - Seated Scapular Retraction  - 1 x daily - 7 x weekly - 2 sets - 10 reps - 5 hold - Seated Shoulder Abduction Full Range  - 1 x daily - 7 x weekly - 2 sets - 10 reps - 5 hold - Seated Elbow Flexion and Extension AROM  - 1 x daily - 7 x weekly - 2 sets - 10 reps - 5 hold - Seated Shoulder Circles  - 1 x  daily - 7 x weekly - 2 sets - 10 reps - 5 hold     PT Short Term Goals -                 PT SHORT TERM GOAL #1    Title Patient will be independent in home exercise program to improve strength/mobility for better functional independence with ADLs.     Baseline 4/24: HEP given 5/30: compelting HEP regularly     Time 4     Period Weeks     Status Achieved     Target Date 09/13/21                     PT Long Term Goals -                PT LONG TERM GOAL #1    Title Patient will increase FOTO score to equal to or greater than 60  to demonstrate statistically significant improvement in mobility and quality of life.     Baseline 4/24: give next session 5/30:38 7/13: 61%     Time 12     Period Weeks     Status MET    Target Date 11/08/21          PT LONG TERM GOAL #2    Title Patient will report a worst pain of 3/10 on VAS in  L shoulder to improve tolerance with ADLs and reduced symptoms with activities.     Baseline 4/24: 10/10 5/30: 3/10 at worst in past few daysworse pain about 2 weeks ago  7/12: 1-2/10     Time 12     Period Weeks     Status MET    Target Date 11/08/21          PT LONG TERM GOAL #3    Title Patient will improve shoulder AROM to > 140 degrees of flexion 12and abduction for improved ability to perform overhead activities.     Baseline 4/24: unable to perform AROM; PROM flexion 102, abduction 95, ER: 15 degrees 7/13: see note    Time 12     Period Weeks     Status On-going     Target Date 01/27/2022           PT LONG TERM GOAL #4  Title Patient will decrease Quick DASH score to >40% demonstrating reduced self-reported upper extremity disability.     Baseline 4/24: 88.6% 5/30:75 7/13: 15%     Time 12     Period Weeks     Status MET    Target Date 11/08/21     PT LONG TERM GOAL #5  Title Patient will improve shoulder MMT to 4+/5 to return to PLOF   Baseline 7/13: waiting physician restriction limitations: grossly 2+/5   Time 12   Period Weeks    Status NEW  Target Date 01/27/2022                  Plan     Clinical Impression Statement Patient has one more session until discharge. Patient aware and agreeable of plan as this is his preference/ patient brought to PT awareness that he wants to discharge. Patient will continue to benefit from skilled physical therapy to reduce pain, improve ROM, and return to PLOF.    Personal Factors and Comorbidities Comorbidity 3+;Fitness;Past/Current Experience;Transportation    Comorbidities CAD, NSTEMI, HTN, HLD, angina, hemiparesis s/p stroke, arthritis, insomnia, RLS    Examination-Activity Limitations Bathing;Bed Mobility;Bend;Caring for Others;Carry;Dressing;Hygiene/Grooming;Lift;Locomotion Level;Reach Overhead;Self Feeding;Sit;Transfers;Toileting;Stand    Examination-Participation Restrictions Cleaning;Community Activity;Interpersonal Relationship;Meal Prep;Medication Management;Laundry;Shop;Volunteer;Yard Work    [employer]chant [employer] officer Evolving/Moderate complexity    Clinical Decision Making Moderate    Rehab Potential Fair    PT Frequency 2x / week    PT Duration 12 weeks    PT Treatment/Interventions ADLs/Self Care Home Management;Cryotherapy;Electrical Stimulation;Iontophoresis 22m/ml Dexamethasone;Moist Heat;Traction;Ultrasound;Functional mobility training;Therapeutic activities;Therapeutic exercise;Balance training;Neuromuscular re-education;Manual techniques;Wheelchair mobility training;Patient/family education;Passive range of motion;Dry needling;Energy conservation;Splinting;Taping;Vasopneumatic Device;Vestibular;Visual/perceptual remediation/compensation;Spinal Manipulations;Joint Manipulations    PT Next Visit Plan follow protocol    PT Home Exercise Plan see above; no updates on 10/19/21    Consulted and Agree with Plan of Care Patient                 MJanna Arch PT 12/07/2021, 3:15 PM

## 2021-12-07 ENCOUNTER — Ambulatory Visit: Payer: Medicare Other

## 2021-12-07 DIAGNOSIS — M6281 Muscle weakness (generalized): Secondary | ICD-10-CM

## 2021-12-07 DIAGNOSIS — M25512 Pain in left shoulder: Secondary | ICD-10-CM

## 2021-12-07 DIAGNOSIS — M25612 Stiffness of left shoulder, not elsewhere classified: Secondary | ICD-10-CM | POA: Diagnosis not present

## 2021-12-07 DIAGNOSIS — G8929 Other chronic pain: Secondary | ICD-10-CM

## 2021-12-09 ENCOUNTER — Ambulatory Visit: Payer: Medicare Other

## 2021-12-09 DIAGNOSIS — M6281 Muscle weakness (generalized): Secondary | ICD-10-CM

## 2021-12-09 DIAGNOSIS — G8929 Other chronic pain: Secondary | ICD-10-CM

## 2021-12-09 DIAGNOSIS — M25612 Stiffness of left shoulder, not elsewhere classified: Secondary | ICD-10-CM | POA: Diagnosis not present

## 2021-12-09 NOTE — Therapy (Signed)
OUTPATIENT PHYSICAL THERAPY TREATMENT NOTE/Discharge/Physical Therapy Progress Note   Dates of reporting period  11/04/21   to   12/09/21    Patient Name: Micheal Gonzalez MRN: [id number] DOB:09/14/1960, 61 y.o., male Today's Date: 12/09/2021  PCP: Dereck Ligas, DO REFERRING PROVIDER: Belva Chimes, MD   PT End of Session - 12/09/21 1438     Visit Number 30    Number of Visits 85    Date for PT Re-Evaluation 01/27/22    Authorization Type BCBS Medicare; next session discharge    PT Start Time 1430    PT Stop Time 1514    PT Time Calculation (min) 44 min    Equipment Utilized During Treatment Other (comment)    Activity Tolerance Patient tolerated treatment well;No increased pain    Behavior During Therapy [facility] for tasks assessed/performed                     Past Medical History:  Diagnosis Date   Allergy    Arthritis    right foot   Coronary artery disease    Triple Bypass done at [facility] Cardiologist Dr. Concha Pyo   ED (erectile dysfunction)    Exertional angina ([place]) 10/23/1599   Folliculitis barbae    Gait disorder    History of stroke    Right hemiparesis   Hypertension    Insomnia    Other vitamin B12 deficiency anemia 07/11/2013   RLS (restless legs syndrome)    Stroke [facility])    June 2014- partial paralysis on right side   Past Surgical History:  Procedure Laterality Date   CARDIAC CATHETERIZATION N/A 09/08/2014   Procedure: Left Heart Cath and Coronary Angiography;  Surgeon: Sherren Mocha, MD;  Location: [place] [facility];  Service: Cardiovascular;  Laterality: N/A;   COLONOSCOPY  03/25/11   CORONARY ARTERY BYPASS GRAFT  09-15-14   Triple   TRIGGER FINGER RELEASE Left 12/09/2014   Procedure: RELEASE TRIGGER FINGER/A-1 PULLEY LEFT THUMB, INDEX, MIDDLE, RING FINGERS;  Surgeon: Daryll Brod, MD;  Location: [place];  Service: Orthopedics;  Laterality: Left;   Patient Active Problem List   Diagnosis Date Noted   Coronary  atherosclerosis of native coronary artery 11/04/2014   NSTEMI (non-ST elevated myocardial infarction) ([place])    Coronary artery disease due to lipid rich plaque    Hyperlipidemia    Essential hypertension    Exertional angina ([place]) 09/08/2014   Unstable angina pectoris ([place]) 09/08/2014   Burning chest pain 08/22/2014   Other vitamin B12 deficiency anemia 07/11/2013   Hemiparesis and alteration of sensations as late effects of stroke ([place]) 07/10/2013   Arthritis 06/25/2013   Insomnia, persistent 12/20/2012   Spastic hemiplegia affecting dominant side ([place]) 12/07/2012   CVA (cerebral infarction) 10/11/2012   Unsteady gait 10/09/2012   HTN (hypertension) 05/31/2012   Family history of CVA 05/17/2012   History of renal stone 05/17/2012   RLS (restless legs syndrome) 01/06/2011   Allergic rhinitis due to pollen 01/06/2011   ED (erectile dysfunction) 01/06/2011   Family history of colon cancer 01/06/2011   Family history of heart disease in male family member before age 77 01/06/2011    REFERRING DIAG: M24.9 (ICD-10-CM) - Joint derangement, unspecified  THERAPY DIAG:  Stiffness of left shoulder, not elsewhere classified  Muscle weakness (generalized)  Chronic left shoulder pain   Rationale for Evaluation and Treatment Rehabilitation   PERTINENT HISTORY: Left rotator cuff tendon arthroscopy, tendonesis of long tendon of bicep,  debridement. on 08/13/21. Patient has PMH of allergy, arthritis, CAD, ED, angina, gait disorder, stroke 2014 (R hemiparesis), HTN, insomnia, RLS, having a hard time getting out of bed.   PRECAUTIONS: See Protocol in Chart      Symptoms/Limitations  Subjective Patient wants today to be discharge per his plan.    Pertinent History Left rotator cuff tendon arthroscopy, tendonesis of long tendon of bicep, debridement. on 08/13/21. Patient has PMH of allergy, arthritis, CAD, ED, angina, gait disorder, stroke 2014 (R hemiparesis), HTN, insomnia, RLS, having a hard  time getting out of bed.  Patient Stated Goals to be able to use my L arm again  Pain Assessment  Currently in Pain? No/denies    Amador Cunas, MD - 11/15/2021 10:40 AM EDT   Formatting of this note might be different from the original. 1. Continue the post-operative protocol as instructed.  2. PT: Continue PT twice weekly.  Continue range of motion exercise: ER with a stick to 70 degrees, overhead flexion with contralateral arm to 160 degrees supine, IR (sleeper stretch). Consider dry needling for the muscle spasm and increased tone which is causing more tightness/stiffness. Improved in clinic when trying contract/relax technique. Continue strengthening program: Bands for ER performed in upright position, dumbbells for scaption - transition from supine to reclined 45 degrees to upright position per protocol.  Goal to achieve 10lbs flexion for 3x10 in one month.  3. Activities:  A) Approved: Biceps curls, triceps press downs/kick backs. May run, bike, use elliptical (without arms) or stairmaster. B) Not approved: Push-ups, dips, pull-ups, overhead press, bench press or power lifting. No swimming or throwing. No contact sports/activities until 6 months.  4. Continue ibuprofen or other anti-inflammatory as needed. Patient to report any GI side effects related to the ibuprofen. May take Tylenol as needed for pain.  5. May return to using cane with left hand as needed.  6. Return to clinic in 4 weeks. Electronically signed by Amador Cunas, MD at 11/15/2021 11:28 AM EDT    TODAY'S TREATMENT:   Goals: FOTO : 72% Shoulder AROM  Shoulder MMT   Left  Shoulder Flexion 4+  Shoulder Abduction 4+  ER 4  IR 4+      TherEx:  LEFT UE Supine Serratus puches/Scapular protraction/retraction -2 sets of 15 reps with #4 dumbbell Scapular stabilization against pertubations x 10 seconds x 2 trials  Supine flexion  4# dumbell- 15x, 2 sets AROM PNF D2 pattern 15x AROM PNF D1  pattern 15x      Recline 45 degrees:  4lb dumbell bicep flexion 15x ; x 2 sets   Preacher curl 4lb dumbbell 15x; x2 sets Abduction 15x no weight x2 sets Bent arm abduction with weight #4; 10x ; x 2 sets  Tricep extension RTB 15x Incline chest press 4lb dumbbell    Seated:    RTB ER 15x; 2 sets RTB IR 15x 2 sets RTB row 15x 5 second holds x RTB bicep curl 15x  Flexion circles clockwise 10x, counterclockwise 10x  Abduction circles clockwise 10x, counterclockwise 10x                           Rationale for Evaluation and Treatment Rehabilitation   Pt educated throughout session about proper posture and technique with exercises. Improved exercise technique, movement at target joints, use of target muscles after min to mod verbal, visual, tactile cues.  PATIENT EDUCATION: Education details: Exercise technique  Person educated: Patient  Education method: Explanation, Demonstration, and Verbal cues Education comprehension: verbalized understanding and returned demonstration     HOME EXERCISE PROGRAM: Access Code: [email or web address] URL: [email or web address] Date: 10/07/2021 Prepared by: Sande Brothers   Exercises - Supine Shoulder Flexion AAROM with Hands Clasped  - 7 x weekly - 3 sets - 15 reps - Supine Shoulder Flexion AAROM with Dowel  - 7 x weekly - 3 sets - 15 reps - Supine Shoulder Rhythmic Stabilization- Flexion/Extension  - 7 x weekly - 3 sets - 15 reps - Seated Shoulder External Rotation AAROM with Dowel  - 7 x weekly - 3 sets - 15 reps  NEW 7/20:  Access Code: [email or web address] URL: [email or web address] Date: 11/11/2021 Prepared by: Janna Arch  Exercises - Seated Shoulder Flexion Full Range  - 1 x daily - 7 x weekly - 2 sets - 10 reps - 5 hold - Seated Scapular Retraction  - 1 x daily - 7 x weekly - 2 sets - 10 reps - 5 hold - Seated Shoulder Abduction Full Range  - 1 x daily - 7 x weekly - 2 sets - 10 reps - 5 hold - Seated Elbow Flexion  and Extension AROM  - 1 x daily - 7 x weekly - 2 sets - 10 reps - 5 hold - Seated Shoulder Circles  - 1 x daily - 7 x weekly - 2 sets - 10 reps - 5 hold     PT Short Term Goals -                 PT SHORT TERM GOAL #1    Title Patient will be independent in home exercise program to improve strength/mobility for better functional independence with ADLs.     Baseline 4/24: HEP given 5/30: compelting HEP regularly     Time 4     Period Weeks     Status Achieved     Target Date 09/13/21                     PT Long Term Goals -                PT LONG TERM GOAL #1    Title Patient will increase FOTO score to equal to or greater than 60  to demonstrate statistically significant improvement in mobility and quality of life.     Baseline 4/24: give next session 5/30:38 7/13: 61%     Time 12     Period Weeks     Status MET    Target Date 11/08/21          PT LONG TERM GOAL #2    Title Patient will report a worst pain of 3/10 on VAS in  L shoulder to improve tolerance with ADLs and reduced symptoms with activities.     Baseline 4/24: 10/10 5/30: 3/10 at worst in past few daysworse pain about 2 weeks ago  7/12: 1-2/10     Time 12     Period Weeks     Status MET    Target Date 11/08/21          PT LONG TERM GOAL #3    Title Patient will improve shoulder AROM to > 140 degrees of flexion 12and abduction for improved ability to perform overhead activities.     Baseline 4/24: unable to perform AROM; PROM flexion 102, abduction 95, ER: 15 degrees 7/13: see note 8/17: 145 flexion 156 abduction     Time 12  Period Weeks     Status MET    Target Date 01/27/2022           PT LONG TERM GOAL #4    Title Patient will decrease Quick DASH score to >40% demonstrating reduced self-reported upper extremity disability.     Baseline 4/24: 88.6% 5/30:75 7/13: 15%     Time 12     Period Weeks     Status MET    Target Date 11/08/21     PT LONG TERM GOAL #5  Title Patient will improve  shoulder MMT to 4+/5 to return to PLOF   Baseline 7/13: waiting physician restriction limitations: grossly 2+/5 8/17: 4+/5 Er 4/5   Time 12   Period Weeks   Status Partially Met   Target Date 01/27/2022                  Plan     Clinical Impression Statement Patient has met all goals except strength of external range of motion by half a muscle grade. Patient is aware today is last session and is agreeable to plan of care. We will be happy to see patient again in the future as needed. .    Personal Factors and Comorbidities Comorbidity 3+;Fitness;Past/Current Experience;Transportation    Comorbidities CAD, NSTEMI, HTN, HLD, angina, hemiparesis s/p stroke, arthritis, insomnia, RLS    Examination-Activity Limitations Bathing;Bed Mobility;Bend;Caring for Others;Carry;Dressing;Hygiene/Grooming;Lift;Locomotion Level;Reach Overhead;Self Feeding;Sit;Transfers;Toileting;Stand    Examination-Participation Restrictions Cleaning;Community Activity;Interpersonal Relationship;Meal Prep;Medication Management;Laundry;Shop;Volunteer;Yard Work    [employer]chant [employer] officer Evolving/Moderate complexity    Clinical Decision Making Moderate    Rehab Potential Fair    PT Frequency 2x / week    PT Duration 12 weeks    PT Treatment/Interventions ADLs/Self Care Home Management;Cryotherapy;Electrical Stimulation;Iontophoresis 18m/ml Dexamethasone;Moist Heat;Traction;Ultrasound;Functional mobility training;Therapeutic activities;Therapeutic exercise;Balance training;Neuromuscular re-education;Manual techniques;Wheelchair mobility training;Patient/family education;Passive range of motion;Dry needling;Energy conservation;Splinting;Taping;Vasopneumatic Device;Vestibular;Visual/perceptual remediation/compensation;Spinal Manipulations;Joint Manipulations    PT Next Visit Plan follow protocol    PT Home Exercise Plan see above; no updates on 10/19/21    Consulted and Agree with Plan of Care Patient                  MJanna Arch PT 12/09/2021, 3:14 PM

## 2021-12-14 ENCOUNTER — Ambulatory Visit: Payer: Medicare Other

## 2021-12-16 ENCOUNTER — Ambulatory Visit: Payer: Medicare Other

## 2021-12-21 ENCOUNTER — Ambulatory Visit: Payer: Medicare Other

## 2021-12-23 ENCOUNTER — Ambulatory Visit: Payer: Medicare Other

## 2022-01-17 ENCOUNTER — Encounter: Payer: Self-pay | Admitting: Student in an Organized Health Care Education/Training Program

## 2022-01-17 ENCOUNTER — Ambulatory Visit
Payer: Medicare Other | Attending: Student in an Organized Health Care Education/Training Program | Admitting: Student in an Organized Health Care Education/Training Program

## 2022-01-17 VITALS — BP 142/89 | HR 58 | Temp 97.3°F | Resp 18 | Ht 72.0 in | Wt 221.4 lb

## 2022-01-17 DIAGNOSIS — G8929 Other chronic pain: Secondary | ICD-10-CM | POA: Diagnosis present

## 2022-01-17 DIAGNOSIS — G894 Chronic pain syndrome: Secondary | ICD-10-CM | POA: Insufficient documentation

## 2022-01-17 DIAGNOSIS — I639 Cerebral infarction, unspecified: Secondary | ICD-10-CM | POA: Insufficient documentation

## 2022-01-17 DIAGNOSIS — M792 Neuralgia and neuritis, unspecified: Secondary | ICD-10-CM | POA: Diagnosis not present

## 2022-01-17 MED ORDER — PREGABALIN 75 MG PO CAPS: 75.0000 mg | ORAL_CAPSULE | Freq: Two times a day (BID) | ORAL | 1 refills | Status: AC

## 2022-01-17 NOTE — Progress Notes (Signed)
Patient: Micheal Gonzalez  Service Category: E/M  Provider: Gillis Santa, MD  DOB: [birth year]  DOS: 01/17/2022  Referring Provider: Dereck Ligas, DO  MRN: [id number]  Setting: Ambulatory outpatient  PCP: Dereck Ligas, DO  Type: New Patient  Specialty: Interventional Pain Management    Location: Office  Delivery: Face-to-face     Primary Reason(s) for Visit: Encounter for initial evaluation of one or more chronic problems (new to examiner) potentially causing chronic pain, and posing a threat to normal musculoskeletal function. (Level of risk: High) CC: Hand Pain (right)  HPI  Micheal Gonzalez is a 61 y.o. year old, male patient, who comes for the first time to our practice referred by Dereck Ligas, DO for our initial evaluation of his chronic pain. He has RLS (restless legs syndrome); Allergic rhinitis due to pollen; ED (erectile dysfunction); Family history of colon cancer; Family history of heart disease in male family member before age 26; Family history of CVA; History of renal stone; HTN (hypertension); Unsteady gait; Cerebral infarction ([place]); Spastic hemiplegia affecting dominant side ([place]); Insomnia, persistent; Arthritis; Hemiparesis and alteration of sensations as late effects of stroke ([place]); Other vitamin B12 deficiency anemia; Burning chest pain; Exertional angina ([place]); Unstable angina pectoris [facility]); NSTEMI (non-ST elevated myocardial infarction) ([place]); Coronary artery disease due to lipid rich plaque; Hyperlipidemia; Essential hypertension; Coronary atherosclerosis of native coronary artery; Intractable neuropathic pain of right hand; and Chronic pain syndrome on their problem list. Today he comes in for evaluation of his Hand Pain (right)  Pain Assessment: Location: Right Hand Radiating: right hand, effects Onset: More than a month ago Duration: Chronic pain Quality: Aching, Constant Severity: 8 /10 (subjective, self-reported pain score)  Effect on ADL: limits daily  activities Timing: Constant Modifying factors: Tramadol, hand splint BP: (!) 142/89  HR: (!) 58  Onset and Duration: Sudden Cause of pain:  stroke Severity: No change since onset, NAS-11 at its worse: 7/10, NAS-11 at its best: 7/10, NAS-11 now: 7/10, and NAS-11 on the average: 8/10 Timing: Not influenced by the time of the day Aggravating Factors: Lifiting Alleviating Factors: Medications, TENS, and Using a brace Associated Problems: Erectile dysfunction, Fatigue, Sweating, and Weakness Quality of Pain: Aching, Agonizing, Constant, Deep, Disabling, Getting longer, and Uncomfortable  Micheal Gonzalez is a pleasant 61 year old male who presents with a chief complaint of right hand neuropathic pain related to previous stroke that he sustained about 10 years ago.  He states that the stroke left him with limited mobility of his right forearm and hand.  He has limited ability with pronation and supination as well as flexion and extension of his wrist.  He does have increased muscle tone and slight contractures of his hand.  He has tried various neuropathic's including amitriptyline, nortriptyline, gabapentin 1200 mg nightly, Cymbalta 60 mg daily all of which were either ineffective or resulted in side effects specifically of nausea with Cymbalta.  He is currently on Lyrica 75 mg daily for his restless leg syndrome.  We discussed increasing that to 75 twice daily after 1 week if he tolerates it well.  He also utilizes tramadol 50 mg daily to help manage his pain.  We can consider extended release tramadol as this does have neuropathic pain relieving properties.  He is currently participating in physical therapy which I commended him on.  Historic Controlled Substance Pharmacotherapy Review  PMP and historical list of controlled substances: Tramadol 50 mg daily as needed Historical Monitoring: The patient  reports no history of drug use. List of  prior UDS Testing: No results found for: "MDMA",  "COCAINSCRNUR", "PCPSCRNUR", "PCPQUANT", "CANNABQUANT", "THCU", "ETH", "CBDTHCR", "D8THCCBX", "D9THCCBX" Historical Background Evaluation: [place] PMP: PDMP reviewed during this encounter. Review of the past 58-month conducted.             [place] Department of public safety, offender search: (Editor, commissioningInformation) Non-contributory Risk Assessment Profile: Aberrant behavior: None observed or detected today Risk factors for fatal opioid overdose: None identified today Fatal overdose hazard ratio (HR): Calculation deferred Non-fatal overdose hazard ratio (HR): Calculation deferred Risk of opioid abuse or dependence: 0.7-3.0% with doses ? 36 MME/day and 6.1-26% with doses ? 120 MME/day. Substance use disorder (SUD) risk level: See below Personal History of Substance Abuse (SUD-Substance use disorder):  Alcohol: Negative  Illegal Drugs: Negative  Rx Drugs: Negative  ORT Risk Level calculation: Moderate Risk  Opioid Risk Tool - 01/17/22 1120       Family History of Substance Abuse   Alcohol Positive Male    Illegal Drugs Positive Male    Rx Drugs Negative      Personal History of Substance Abuse   Alcohol Negative    Illegal Drugs Negative    Rx Drugs Negative      Age   Age between 123-45years  No      History of Preadolescent Sexual Abuse   History of Preadolescent Sexual Abuse Negative or Male      Psychological Disease   Psychological Disease Negative    Depression Negative      Total Score   Opioid Risk Tool Scoring 6    Opioid Risk Interpretation Moderate Risk            ORT Scoring interpretation table:  Score <3 = Low Risk for SUD  Score between 4-7 = Moderate Risk for SUD  Score >8 = High Risk for Opioid Abuse   PHQ-2 Depression Scale:  Total score:    PHQ-2 Scoring interpretation table: (Score and probability of major depressive disorder)  Score 0 = No depression  Score 1 = 15.4% Probability  Score 2 = 21.1% Probability  Score 3 = 38.4% Probability  Score 4 =  45.5% Probability  Score 5 = 56.4% Probability  Score 6 = 78.6% Probability   PHQ-9 Depression Scale:  Total score:    PHQ-9 Scoring interpretation table:  Score 0-4 = No depression  Score 5-9 = Mild depression  Score 10-14 = Moderate depression  Score 15-19 = Moderately severe depression  Score 20-27 = Severe depression (2.4 times higher risk of SUD and 2.89 times higher risk of overuse)   Pharmacologic Plan: As per protocol, I have not taken over any controlled substance management, pending the results of ordered tests and/or consults.            Initial impression: Pending review of available data and ordered tests.  Meds   Current Outpatient Medications:    aspirin EC 81 MG tablet, Take by mouth., Disp: , Rfl:    atorvastatin (LIPITOR) 80 MG tablet, Take 1 tablet (80 mg total) by mouth daily at 6 PM., Disp: 30 tablet, Rfl: 11   carvedilol (COREG) 25 MG tablet, Take by mouth., Disp: , Rfl:    cholecalciferol (VITAMIN D) 1000 UNITS tablet, Take 1,000 Units by mouth daily., Disp: , Rfl:    ferrous sulfate 325 (65 FE) MG tablet, Take 325 mg by mouth daily with breakfast., Disp: , Rfl:    Lemborexant (DAYVIGO) 10 MG TABS, Take by mouth., Disp: , Rfl:  Multiple Vitamins tablet, Take 1 tablet by mouth daily., Disp: , Rfl:    pregabalin (LYRICA) 75 MG capsule, Take 1 capsule (75 mg total) by mouth 2 (two) times daily., Disp: 60 capsule, Rfl: 1   traMADol (ULTRAM) 50 MG tablet, Take by mouth every 6 (six) hours as needed., Disp: , Rfl:   Imaging Review   ROS  Cardiovascular: Heart trouble, Daily Aspirin intake, High blood pressure, Heart surgery, and Blood thinners:  Antiplatelet Pulmonary or Respiratory: Snoring  and Temporary stoppage of breathing during sleep Neurological: Stroke (Residual deficits or weakness: right side) Psychological-Psychiatric: Depressed and Difficulty sleeping and or falling asleep Gastrointestinal: No reported gastrointestinal signs or symptoms such as  vomiting or evacuating blood, reflux, heartburn, alternating episodes of diarrhea and constipation, inflamed or scarred liver, or pancreas or irrregular and/or infrequent bowel movements Genitourinary: Kidney disease and Passing kidney stones Hematological: No reported hematological signs or symptoms such as prolonged bleeding, low or poor functioning platelets, bruising or bleeding easily, hereditary bleeding problems, low energy levels due to low hemoglobin or being anemic Endocrine: No reported endocrine signs or symptoms such as high or low blood sugar, rapid heart rate due to high thyroid levels, obesity or weight gain due to slow thyroid or thyroid disease Rheumatologic: No reported rheumatological signs and symptoms such as fatigue, joint pain, tenderness, swelling, redness, heat, stiffness, decreased range of motion, with or without associated rash Musculoskeletal: Negative for myasthenia gravis, muscular dystrophy, multiple sclerosis or malignant hyperthermia Work History: Disabled  Allergies  Micheal Gonzalez has No Known Allergies.  Laboratory Chemistry Profile   Renal Lab Results  Component Value Date   BUN 9 09/05/2014   CREATININE 1.22 09/05/2014   BCR 7 (L) 07/10/2013   GFR 79.29 09/05/2014   GFRAA 64 07/10/2013   GFRNONAA 56 (L) 07/10/2013   SPECGRAV 1.025 07/24/2014   PHUR 6.0 07/24/2014   PROTEINUR n 07/24/2014     Electrolytes Lab Results  Component Value Date   NA 136 09/05/2014   K 3.7 09/05/2014   CL 105 09/05/2014   CALCIUM 9.1 09/05/2014     Hepatic Lab Results  Component Value Date   AST 15 07/24/2014   ALT 10 07/24/2014   ALBUMIN 4.4 07/24/2014   ALKPHOS 49 07/24/2014     ID Lab Results  Component Value Date   MRSAPCR NEGATIVE 09/08/2014     Bone No results found for: "VD25OH", "VD125OH2TOT", "CH8527PO2", "UM3536RW4", "31VQMGQQ7", "25OHVITD2", "61PJKDTO6", "TESTOFREE", "TESTOSTERONE"   Endocrine Lab Results  Component Value Date   GLUCOSE 93  09/05/2014   HGBA1C 5.3 10/10/2012   TSH 3.180 07/10/2013     Neuropathy Lab Results  Component Value Date   VITAMINB12 144 (L) 07/10/2013   HGBA1C 5.3 10/10/2012     CNS No results found for: "COLORCSF", "APPEARCSF", "RBCCOUNTCSF", "WBCCSF", "POLYSCSF", "LYMPHSCSF", "EOSCSF", "PROTEINCSF", "GLUCCSF", "JCVIRUS", "CSFOLI", "IGGCSF", "LABACHR", "ACETBL"   Inflammation (CRP: Acute  ESR: Chronic) No results found for: "CRP", "ESRSEDRATE", "LATICACIDVEN"   Rheumatology No results found for: "RF", "ANA", "LABURIC", "URICUR", "LYMEIGGIGMAB", "LYMEABIGMQN", "HLAB27"   Coagulation Lab Results  Component Value Date   INR 1.0 09/05/2014   LABPROT 11.3 09/05/2014   PLT 144 (L) 09/09/2014     Cardiovascular Lab Results  Component Value Date   TROPONINI <0.30 10/09/2012   HGB 14.1 12/09/2014   HCT 42.6 09/09/2014     Screening Lab Results  Component Value Date   MRSAPCR NEGATIVE 09/08/2014     Cancer No results found for: "CEA", "CA125", "LABCA2"  Allergens No results found for: "ALMOND", "APPLE", "ASPARAGUS", "AVOCADO", "BANANA", "BARLEY", "BASIL", "BAYLEAF", "GREENBEAN", "LIMABEAN", "WHITEBEAN", "BEEFIGE", "REDBEET", "BLUEBERRY", "BROCCOLI", "CABBAGE", "MELON", "CARROT", "CASEIN", "CASHEWNUT", "CAULIFLOWER", "CELERY"     Note: Lab results reviewed.  [place]  Drug: Micheal Gonzalez  reports no history of drug use. Alcohol:  reports current alcohol use of about 2.0 standard drinks of alcohol per week. Tobacco:  reports that he has never smoked. He has never used smokeless tobacco. Medical:  has a past medical history of Allergy, Arthritis, Coronary artery disease, ED (erectile dysfunction), Exertional angina ([place]) (6/33/3545), Folliculitis barbae, Gait disorder, History of stroke, Hypertension, Insomnia, Other vitamin B12 deficiency anemia (07/11/2013), RLS (restless legs syndrome), and Stroke ([place]). Family: family history includes Cancer in his brother; Cancer (age of onset: 87) in  his sister; Cancer (age of onset: 49) in his mother; Heart disease (age of onset: 74) in his mother; Stroke (age of onset: 78) in his sister.  Past Surgical History:  Procedure Laterality Date   CARDIAC CATHETERIZATION N/A 09/08/2014   Procedure: Left Heart Cath and Coronary Angiography;  Surgeon: Sherren Mocha, MD;  Location: [facility];  Service: Cardiovascular;  Laterality: N/A;   COLONOSCOPY  03/25/11   CORONARY ARTERY BYPASS GRAFT  09-15-14   Triple   TRIGGER FINGER RELEASE Left 12/09/2014   Procedure: RELEASE TRIGGER FINGER/A-1 PULLEY LEFT THUMB, INDEX, MIDDLE, RING FINGERS;  Surgeon: Daryll Brod, MD;  Location: [facility];  Service: Orthopedics;  Laterality: Left;   Active Ambulatory Problems    Diagnosis Date Noted   RLS (restless legs syndrome) 01/06/2011   Allergic rhinitis due to pollen 01/06/2011   ED (erectile dysfunction) 01/06/2011   Family history of colon cancer 01/06/2011   Family history of heart disease in male family member before age 39 01/06/2011   Family history of CVA 05/17/2012   History of renal stone 05/17/2012   HTN (hypertension) 05/31/2012   Unsteady gait 10/09/2012   Cerebral infarction ([place]) 10/11/2012   Spastic hemiplegia affecting dominant side ([place]) 12/07/2012   Insomnia, persistent 12/20/2012   Arthritis 06/25/2013   Hemiparesis and alteration of sensations as late effects of stroke ([place]) 07/10/2013   Other vitamin B12 deficiency anemia 07/11/2013   Burning chest pain 08/22/2014   Exertional angina ([place]) 09/08/2014   Unstable angina pectoris ([place]) 09/08/2014   NSTEMI (non-ST elevated myocardial infarction) ([place])    Coronary artery disease due to lipid rich plaque    Hyperlipidemia    Essential hypertension    Coronary atherosclerosis of native coronary artery 11/04/2014   Intractable neuropathic pain of right hand 01/17/2022   Chronic pain syndrome 01/17/2022   Resolved Ambulatory Problems    Diagnosis Date Noted    No Resolved Ambulatory Problems   Past Medical History:  Diagnosis Date   Allergy    Coronary artery disease    Folliculitis barbae    Gait disorder    History of stroke    Hypertension    Insomnia    Stroke ([place])    Constitutional Exam  General appearance: Well nourished, well developed, and well hydrated. In no apparent acute distress Vitals:   01/17/22 1109  BP: (!) 142/89  Pulse: (!) 58  Resp: 18  Temp: (!) 97.3 F (36.3 C)  SpO2: 99%  Weight: 221 lb 6.4 oz (100.4 kg)  Height: 6' (1.829 m)   BMI Assessment: Estimated body mass index is 30.03 kg/m as calculated from the following:   Height as of this encounter: 6' (1.829 m).  Weight as of this encounter: 221 lb 6.4 oz (100.4 kg).  BMI interpretation table: BMI level Category Range association with higher incidence of chronic pain  <18 kg/m2 Underweight   18.5-24.9 kg/m2 Ideal body weight   25-29.9 kg/m2 Overweight Increased incidence by 20%  30-34.9 kg/m2 Obese (Class I) Increased incidence by 68%  35-39.9 kg/m2 Severe obesity (Class II) Increased incidence by 136%  >40 kg/m2 Extreme obesity (Class III) Increased incidence by 254%   Patient's current BMI Ideal Body weight  Body mass index is 30.03 kg/m. Ideal body weight: 77.6 kg (171 lb 1.2 oz) Adjusted ideal body weight: 86.7 kg (191 lb 3.3 oz)   BMI Readings from Last 4 Encounters:  01/17/22 30.03 kg/m  12/09/14 29.57 kg/m  10/29/14 28.32 kg/m  09/08/14 29.18 kg/m   Wt Readings from Last 4 Encounters:  01/17/22 221 lb 6.4 oz (100.4 kg)  12/09/14 218 lb (98.9 kg)  10/29/14 208 lb 12.8 oz (94.7 kg)  09/08/14 215 lb 2.7 oz (97.6 kg)    Psych/Mental status: Alert, oriented x 3 (person, place, & time)       Eyes: PERLA Respiratory: No evidence of acute respiratory distress  Upper Extremity (UE) Exam    Side: Right upper extremity  Side: Left upper extremity  Skin & Extremity Inspection:  Right hand contractures, hypoesthesia, decreased sensation   Skin & Extremity Inspection: Skin color, temperature, and hair growth are WNL. No peripheral edema or cyanosis. No masses, redness, swelling, asymmetry, or associated skin lesions. No contractures.  Functional ROM: Impaired ROM difficulty with supination pronation wrist extension flexion          Functional ROM: Unrestricted ROM          Muscle Tone/Strength:  Mild rigidity  Muscle Tone/Strength: Functionally intact. No obvious neuro-muscular anomalies detected.  Sensory (Neurological): Neurogenic pain pattern          Sensory (Neurological): Unimpaired          Palpation: No palpable anomalies              Palpation: No palpable anomalies              Provocative Test(s):  Phalen's test: deferred Tinel's test: deferred Apley's scratch test (touch opposite shoulder):  Action 1 (Across chest): Decreased ROM Action 2 (Overhead): Decreased ROM Action 3 (LB reach): Decreased ROM   Provocative Test(s):  Phalen's test: deferred Tinel's test: deferred Apley's scratch test (touch opposite shoulder):  Action 1 (Across chest): deferred Action 2 (Overhead): deferred Action 3 (LB reach): deferred     Assessment  Primary Diagnosis & Pertinent Problem List: The primary encounter diagnosis was Chronic central neuropathic pain (s/p CVA 2014). Diagnoses of Cerebral infarction, unspecified mechanism ([place]), Intractable neuropathic pain of right hand, and Chronic pain syndrome were also pertinent to this visit.  Visit Diagnosis (New problems to examiner): 1. Chronic central neuropathic pain (s/p CVA 2014)   2. Cerebral infarction, unspecified mechanism ([place])   3. Intractable neuropathic pain of right hand   4. Chronic pain syndrome    Plan of Care (Initial workup plan)  Note: Micheal Gonzalez was reminded that as per protocol, today's visit has been an evaluation only. We have not taken over the patient's controlled substance management. General Recommendations: The pain condition that the patient suffers  from is best treated with a multidisciplinary approach that involves an increase in physical activity to prevent de-conditioning and worsening of the pain cycle, as well as psychological counseling (formal and/or informal) to  address the co-morbid psychological affects of pain. Treatment will often involve judicious use of pain medications and interventional procedures to decrease the pain, allowing the patient to participate in the physical activity that will ultimately produce long-lasting pain reductions. The goal of the multidisciplinary approach is to return the patient to a higher level of overall function and to restore their ability to perform activities of daily living.  Discussed central neuropathic pain that can develop after CVA with patient.  Discussed management strategies including physical therapy, Occupational Therapy, optimization of neuropathic agents, consideration of tramadol versus buprenorphine.   Lab Orders         ToxASSURE Select 13 (MW), Urine      Pharmacotherapy (current): Medications ordered:  Meds ordered this encounter  Medications   pregabalin (LYRICA) 75 MG capsule    Sig: Take 1 capsule (75 mg total) by mouth 2 (two) times daily.    Dispense:  60 capsule    Refill:  1    Fill one day early if pharmacy is closed on scheduled refill date. May substitute for generic if available.   Medications administered during this visit: Dellis Filbert A. Mallin "Dr. Dellis Filbert" had no medications administered during this visit.   Pharmacological management options:  Opioid Analgesics: The patient was informed that there is no guarantee that he would be a candidate for opioid analgesics. The decision will be made following CDC guidelines. This decision will be based on the results of diagnostic studies, as well as Micheal Gonzalez risk profile.  We will consider extended release tramadol and possibly buprenorphine  Membrane stabilizer:  Tried and failed TCA, duloxetine, gabapentin.   Titration instructions for Lyrica as above.  Muscle relaxant: To be determined at a later time  NSAID: To be determined at a later time  Other analgesic(s): To be determined at a later time   Interventional management options: Micheal Gonzalez was informed that there is no guarantee that he would be a candidate for interventional therapies. The decision will be based on the results of diagnostic studies, as well as Micheal Gonzalez risk profile.  Procedure(s) under consideration:  Consider lidocaine infusion for neuropathic pain    Provider-requested follow-up: Return in about 5 weeks (around 02/21/2022) for 2nd pt visit (discuss Lyrica).  I spent a total of 60 minutes reviewing chart data, face-to-face evaluation with the patient, counseling and coordination of care as detailed above.  Future Appointments  Date Time Provider [place]  03/01/2022  2:00 PM Gillis Santa, MD [facility] None    Note by: Gillis Santa, MD Date: 01/17/2022; Time: 3:23 PM

## 2022-01-17 NOTE — Progress Notes (Signed)
Safety precautions to be maintained throughout the outpatient stay will include: orient to surroundings, keep bed in low position, maintain call bell within reach at all times, provide assistance with transfer out of bed and ambulation.  

## 2022-01-20 LAB — TOXASSURE SELECT 13 (MW), URINE

## 2022-02-22 ENCOUNTER — Encounter: Payer: Medicare Other | Admitting: Physical Therapy

## 2022-03-01 ENCOUNTER — Ambulatory Visit
Payer: Medicare Other | Attending: Student in an Organized Health Care Education/Training Program | Admitting: Student in an Organized Health Care Education/Training Program

## 2022-03-01 ENCOUNTER — Encounter: Payer: Self-pay | Admitting: Student in an Organized Health Care Education/Training Program

## 2022-03-01 VITALS — BP 137/62 | HR 52 | Temp 97.4°F | Resp 14 | Ht 72.0 in | Wt 220.0 lb

## 2022-03-01 DIAGNOSIS — I639 Cerebral infarction, unspecified: Secondary | ICD-10-CM | POA: Diagnosis present

## 2022-03-01 DIAGNOSIS — M792 Neuralgia and neuritis, unspecified: Secondary | ICD-10-CM | POA: Diagnosis present

## 2022-03-01 DIAGNOSIS — Z0289 Encounter for other administrative examinations: Secondary | ICD-10-CM | POA: Insufficient documentation

## 2022-03-01 DIAGNOSIS — G894 Chronic pain syndrome: Secondary | ICD-10-CM | POA: Diagnosis present

## 2022-03-01 DIAGNOSIS — G8929 Other chronic pain: Secondary | ICD-10-CM | POA: Diagnosis present

## 2022-03-01 MED ORDER — TRAMADOL HCL 50 MG PO TABS: 50.0000 mg | ORAL_TABLET | Freq: Every day | ORAL | 1 refills | Status: AC | PRN

## 2022-03-01 MED ORDER — TRAMADOL HCL ER 100 MG PO TB24: 100.0000 mg | ORAL_TABLET | Freq: Every day | ORAL | 2 refills | Status: AC

## 2022-03-01 NOTE — Progress Notes (Signed)
PROVIDER NOTE: Information contained herein reflects review and annotations entered in association with encounter. Interpretation of such information and data should be left to medically-trained personnel. Information provided to patient can be located elsewhere in the medical record under "Patient Instructions". Document created using STT-dictation technology, any transcriptional errors that may result from process are unintentional.    Patient: Micheal Gonzalez  Service Category: E/M  Provider: Gillis Santa, MD  DOB: [birth year]  DOS: 03/01/2022  Referring Provider: Dereck Ligas, DO  MRN: [id number]  Specialty: Interventional Pain Management  PCP: Micheal Ligas, DO  Type: Established Patient  Setting: Ambulatory outpatient    Location: Office  Delivery: Face-to-face     HPI  [name], a 61 y.o. year old male, is here today because of his Chronic central neuropathic pain [M79.2, G89.29]. Micheal Gonzalez primary complain today is Hand Pain (right) Last encounter: My last encounter with him was on 01/17/2022. Pertinent problems: Micheal Gonzalez has RLS (restless legs syndrome); Cerebral infarction ([place]); Spastic hemiplegia affecting dominant side ([place]); Hemiparesis and alteration of sensations as late effects of stroke ([place]); Intractable neuropathic pain of right hand; and Chronic pain syndrome on their pertinent problem list. Pain Assessment: Severity of Chronic pain is reported as a 8 /10. Location: Hand Right/denies. Onset: More than a month ago. Quality: Dull, Throbbing. Timing: Constant. Modifying factor(s): Tramadol. Vitals:  height is 6' (1.829 m) and weight is 220 lb (99.8 kg). His temporal temperature is 97.4 F (36.3 C) (abnormal). His blood pressure is 137/62 and his pulse is 52 (abnormal). His respiration is 14 and oxygen saturation is 99%.   Reason for encounter: medication management.   Micheal Gonzalez presents today for medication management.  He notes limited response from Lyrica  75 mg twice a day.  He takes tramadol 50 mg daily.  We did discuss adding on extended release tramadol for optimize baseline pain control.  Risk and benefits of tramadol ER discussed with patient.  Start at 100 mg daily.  I instructed him to not take tramadol IR within 3 hours of taking his ER.  I would like for him to continue his Lyrica for the time being as I think it is important for him to be on a neuromodulator given his central post stroke syndrome.    HPI from initial clinic visit:  Micheal Gonzalez is a pleasant 61 year old male who presents with a chief complaint of right hand neuropathic pain related to previous stroke that he sustained about 10 years ago.  He states that the stroke left him with limited mobility of his right forearm and hand.  He has limited ability with pronation and supination as well as flexion and extension of his wrist.  He does have increased muscle tone and slight contractures of his hand.  He has tried various neuropathic's including amitriptyline, nortriptyline, gabapentin 1200 mg nightly, Cymbalta 60 mg daily all of which were either ineffective or resulted in side effects specifically of nausea with Cymbalta.  He is currently on Lyrica 75 mg daily for his restless leg syndrome.  We discussed increasing that to 75 twice daily after 1 week if he tolerates it well.  He also utilizes tramadol 50 mg daily to help manage his pain.  We can consider extended release tramadol as this does have neuropathic pain relieving properties.   He is currently participating in physical therapy which I commended him on.  Pharmacotherapy Assessment  Analgesic: Tramadol 100 mg ER, tramadol 50 to 100 mg IR twice daily  as needed for breakthrough pain  Monitoring: [place] PMP: PDMP not reviewed this encounter.       Pharmacotherapy: No side-effects or adverse reactions reported. Compliance: No problems identified. Effectiveness: Clinically acceptable.  No notes on file  No results found  for: "CBDTHCR" No results found for: "D8THCCBX" No results found for: "D9THCCBX"  UDS:  Summary  Date Value Ref Range Status  01/17/2022 Note  Final    Comment:    ==================================================================== [employer] Select 13 (MW) ==================================================================== Test                             Result       Flag       Units  Drug Present and Declared for Prescription Verification   Tramadol                       3467         EXPECTED   ng/mg creat   O-Desmethyltramadol            >4762        EXPECTED   ng/mg creat   N-Desmethyltramadol            170          EXPECTED   ng/mg creat    Source of tramadol is a prescription medication. O-desmethyltramadol    and N-desmethyltramadol are expected metabolites of tramadol.  ==================================================================== Test                      Result    Flag   Units      Ref Range   Creatinine              105              mg/dL      >=20 ==================================================================== Declared Medications:  The flagging and interpretation on this report are based on the  following declared medications.  Unexpected results may arise from  inaccuracies in the declared medications.   **Note: The testing scope of this panel includes these medications:   Tramadol (Ultram)   **Note: The testing scope of this panel does not include the  following reported medications:   Aspirin  Atorvastatin (Lipitor)  Carvedilol (Coreg)  Iron  Lemborexant ([place])  Multivitamin  Pregabalin (Lyrica)  Vitamin D ==================================================================== For clinical consultation, please call [phone]. ====================================================================       ROS  Constitutional: Denies any fever or chills Gastrointestinal: No reported hemesis, hematochezia, vomiting, or acute GI  distress Musculoskeletal: Denies any acute onset joint swelling, redness, loss of ROM, or weakness Neurological:  Right arm weakness and decreased sensation  Medication Review  Lemborexant, Multiple Vitamins, aspirin EC, atorvastatin, carvedilol, cholecalciferol, ferrous sulfate, pregabalin, and traMADol  History Review  Allergy: Micheal Gonzalez has No Known Allergies. Drug: Micheal Gonzalez  reports no history of drug use. Alcohol:  reports current alcohol use of about 2.0 standard drinks of alcohol per week. Tobacco:  reports that he has never smoked. He has never used smokeless tobacco. Social: Micheal Gonzalez  reports that he has never smoked. He has never used smokeless tobacco. He reports current alcohol use of about 2.0 standard drinks of alcohol per week. He reports that he does not use drugs. Medical:  has a past medical history of Allergy, Arthritis, Coronary artery disease, ED (erectile dysfunction), Exertional angina (4/46/2863), Folliculitis barbae, Gait disorder, History of stroke,  Hypertension, Insomnia, Other vitamin B12 deficiency anemia (07/11/2013), RLS (restless legs syndrome), and Stroke ([place]). Surgical: Micheal Gonzalez  has a past surgical history that includes Colonoscopy (03/25/11); Cardiac catheterization (N/A, 09/08/2014); Coronary artery bypass graft (09-15-14); and Trigger finger release (Left, 12/09/2014). Family: family history includes Cancer in his brother; Cancer (age of onset: 19) in his sister; Cancer (age of onset: 38) in his mother; Heart disease (age of onset: 69) in his mother; Stroke (age of onset: 44) in his sister.  Laboratory Chemistry Profile   Renal Lab Results  Component Value Date   BUN 9 09/05/2014   CREATININE 1.22 09/05/2014   BCR 7 (L) 07/10/2013   GFR 79.29 09/05/2014   GFRAA 64 07/10/2013   GFRNONAA 56 (L) 07/10/2013    Hepatic Lab Results  Component Value Date   AST 15 07/24/2014   ALT 10 07/24/2014   ALBUMIN 4.4 07/24/2014   ALKPHOS 49  07/24/2014    Electrolytes Lab Results  Component Value Date   NA 136 09/05/2014   K 3.7 09/05/2014   CL 105 09/05/2014   CALCIUM 9.1 09/05/2014    Bone No results found for: "VD25OH", "VD125OH2TOT", "HF0263ZC5", "YI5027XA1", "25OHVITD1", "25OHVITD2", "25OHVITD3", "TESTOFREE", "TESTOSTERONE"  Inflammation (CRP: Acute Phase) (ESR: Chronic Phase) No results found for: "CRP", "ESRSEDRATE", "LATICACIDVEN"       Note: Above Lab results reviewed.  Recent Imaging Review  Cardiac catheterization FINAL CONCLUSION:  Severe three-vessel CAD involving the proximal LAD, proximal left  circumflex, and mid RCA  Normal left ventricular systolic function with LVEF 56% based on nuclear  stress scan  RECOMMENDATIONS:  The patient clearly needs revascularization. I reviewed the pros and  cons of multivessel stenting and coronary bypass surgery with the patient  and his wife. The patient has received sedation I do not think it is  appropriate to proceed until he is able to give full informed consent. I  would favor an opinion from cardiac surgery. While multivessel stenting is  technically feasible in this situation, CABG would certainly be reasonable  as well in the setting of three-vessel disease. Will follow-up with him  after he is seen by cardiac surgery. He will be admitted and placed on  intravenous heparin because of critical stenosis involving the left  circumflex and multivessel disease at high risk of abrupt closure. The  patient will be admitted to a cardiac stepdown bed. Note: Reviewed        Physical Exam  General appearance: Well nourished, well developed, and well hydrated. In no apparent acute distress Mental status: Alert, oriented x 3 (person, place, & time)       Respiratory: No evidence of acute respiratory distress Eyes: PERLA Vitals: BP 137/62   Pulse (!) 52   Temp (!) 97.4 F (36.3 C) (Temporal)   Resp 14   Ht 6' (1.829 m)   Wt 220 lb (99.8 kg)   SpO2 99%    BMI 29.84 kg/m  BMI: Estimated body mass index is 29.84 kg/m as calculated from the following:   Height as of this encounter: 6' (1.829 m).   Weight as of this encounter: 220 lb (99.8 kg). Ideal: Ideal body weight: 77.6 kg (171 lb 1.2 oz) Adjusted ideal body weight: 86.5 kg (190 lb 10.3 oz)   Upper Extremity (UE) Exam      Side: Right upper extremity   Side: Left upper extremity  Skin & Extremity Inspection:  Right hand contractures, hypoesthesia, decreased sensation   Skin & Extremity Inspection: Skin color,  temperature, and hair growth are WNL. No peripheral edema or cyanosis. No masses, redness, swelling, asymmetry, or associated skin lesions. No contractures.  Functional ROM: Impaired ROM difficulty with supination pronation wrist extension flexion           Functional ROM: Unrestricted ROM          Muscle Tone/Strength:  Mild rigidity   Muscle Tone/Strength: Functionally intact. No obvious neuro-muscular anomalies detected.  Sensory (Neurological): Neurogenic pain pattern           Sensory (Neurological): Unimpaired          Palpation: No palpable anomalies               Palpation: No palpable anomalies              Provocative Test(s):  Phalen's test: deferred Tinel's test: deferred Apley's scratch test (touch opposite shoulder):  Action 1 (Across chest): Decreased ROM Action 2 (Overhead): Decreased ROM Action 3 (LB reach): Decreased ROM     Provocative Test(s):  Phalen's test: deferred Tinel's test: deferred Apley's scratch test (touch opposite shoulder):  Action 1 (Across chest): deferred Action 2 (Overhead): deferred Action 3 (LB reach): deferred         Assessment   Diagnosis Status  1. Chronic central neuropathic pain (s/p CVA 2014)   2. Cerebral infarction, unspecified mechanism ([place])   3. Intractable neuropathic pain of right hand   4. Chronic pain syndrome   5. Pain management contract signed    Controlled Controlled Controlled   Updated Problems: Problem   Cerebral Infarction ([facility])  Pain Management Contract Signed     Plan of Care   Micheal Gonzalez has a current medication list which includes the following long-term medication(s): atorvastatin, ferrous sulfate, pregabalin, and carvedilol.  Pharmacotherapy (Medications Ordered): Meds ordered this encounter  Medications   traMADol (ULTRAM) 50 MG tablet    Sig: Take 1-2 tablets (50-100 mg total) by mouth daily as needed.    Dispense:  60 tablet    Refill:  1   traMADol (ULTRAM-ER) 100 MG 24 hr tablet    Sig: Take 1 tablet (100 mg total) by mouth daily.    Dispense:  30 tablet    Refill:  2    Follow-up plan:   Return in about 3 months (around 06/01/2022) for Medication Management, in person.    Recent Visits Date Type Provider Dept  01/17/22 Office Visit Micheal Santa, MD [facility]  Showing recent visits within past 90 days and meeting all other requirements Today's Visits Date Type Provider Dept  03/01/22 Office Visit Micheal Santa, MD [facility]  Showing today's visits and meeting all other requirements Future Appointments Date Type Provider Dept  05/26/22 Appointment Micheal Santa, MD [facility]  Showing future appointments within next 90 days and meeting all other requirements  I discussed the assessment and treatment plan with the patient. The patient was provided an opportunity to ask questions and all were answered. The patient agreed with the plan and demonstrated an understanding of the instructions.  Patient advised to call back or seek an in-person evaluation if the symptoms or condition worsens.  Duration of encounter: 38mnutes.  Total time on encounter, as per AMA guidelines included both the face-to-face and non-face-to-face time personally spent by the physician and/or other qualified health care professional(s) on the day of the encounter (includes time in activities that require the physician or other qualified health  care professional and does not  include time in activities normally performed by clinical staff). Physician's time may include the following activities when performed: preparing to see the patient (eg, review of tests, pre-charting review of records) obtaining and/or reviewing separately obtained history performing a medically appropriate examination and/or evaluation counseling and educating the patient/family/caregiver ordering medications, tests, or procedures referring and communicating with other health care professionals (when not separately reported) documenting clinical information in the electronic or other health record independently interpreting results (not separately reported) and communicating results to the patient/ family/caregiver care coordination (not separately reported)  Note by: Micheal Santa, MD Date: 03/01/2022; Time: 2:24 PM

## 2022-04-07 ENCOUNTER — Encounter: Payer: Self-pay | Admitting: *Deleted

## 2022-04-07 ENCOUNTER — Encounter: Payer: Self-pay | Admitting: Student in an Organized Health Care Education/Training Program

## 2022-04-11 NOTE — Telephone Encounter (Signed)
I will check on PA later today.

## 2022-04-12 ENCOUNTER — Encounter: Payer: Self-pay | Admitting: *Deleted

## 2022-05-26 ENCOUNTER — Encounter: Payer: Medicare Other | Admitting: Student in an Organized Health Care Education/Training Program
